# Patient Record
Sex: Female | Born: 1970 | Race: White | Hispanic: No | Marital: Married | Smoking: Current every day smoker
Health system: Southern US, Community
[De-identification: ages and names within clinical notes are randomized; demographics above are authoritative.]

## PROBLEM LIST (undated history)

## (undated) DIAGNOSIS — M5431 Sciatica, right side: Secondary | ICD-10-CM

## (undated) DIAGNOSIS — K219 Gastro-esophageal reflux disease without esophagitis: Secondary | ICD-10-CM

## (undated) DIAGNOSIS — D649 Anemia, unspecified: Secondary | ICD-10-CM

## (undated) DIAGNOSIS — R87619 Unspecified abnormal cytological findings in specimens from cervix uteri: Secondary | ICD-10-CM

## (undated) DIAGNOSIS — T7840XA Allergy, unspecified, initial encounter: Secondary | ICD-10-CM

## (undated) DIAGNOSIS — F32A Depression, unspecified: Secondary | ICD-10-CM

## (undated) DIAGNOSIS — G4730 Sleep apnea, unspecified: Secondary | ICD-10-CM

## (undated) DIAGNOSIS — K7581 Nonalcoholic steatohepatitis (NASH): Secondary | ICD-10-CM

## (undated) DIAGNOSIS — F4310 Post-traumatic stress disorder, unspecified: Secondary | ICD-10-CM

## (undated) DIAGNOSIS — R102 Pelvic and perineal pain: Secondary | ICD-10-CM

## (undated) DIAGNOSIS — K589 Irritable bowel syndrome without diarrhea: Secondary | ICD-10-CM

## (undated) DIAGNOSIS — F419 Anxiety disorder, unspecified: Secondary | ICD-10-CM

## (undated) DIAGNOSIS — M1990 Unspecified osteoarthritis, unspecified site: Secondary | ICD-10-CM

## (undated) DIAGNOSIS — J449 Chronic obstructive pulmonary disease, unspecified: Secondary | ICD-10-CM

## (undated) DIAGNOSIS — F329 Major depressive disorder, single episode, unspecified: Secondary | ICD-10-CM

## (undated) DIAGNOSIS — M549 Dorsalgia, unspecified: Secondary | ICD-10-CM

## (undated) DIAGNOSIS — G8929 Other chronic pain: Secondary | ICD-10-CM

## (undated) DIAGNOSIS — H269 Unspecified cataract: Secondary | ICD-10-CM

## (undated) DIAGNOSIS — C801 Malignant (primary) neoplasm, unspecified: Secondary | ICD-10-CM

## (undated) DIAGNOSIS — B977 Papillomavirus as the cause of diseases classified elsewhere: Secondary | ICD-10-CM

## (undated) DIAGNOSIS — M797 Fibromyalgia: Secondary | ICD-10-CM

## (undated) HISTORY — PX: FRACTURE SURGERY: SHX138

## (undated) HISTORY — DX: Sleep apnea, unspecified: G47.30

## (undated) HISTORY — DX: Papillomavirus as the cause of diseases classified elsewhere: B97.7

## (undated) HISTORY — PX: TUBAL LIGATION: SHX77

## (undated) HISTORY — DX: Gastro-esophageal reflux disease without esophagitis: K21.9

## (undated) HISTORY — PX: COLPOSCOPY VULVA W/ BIOPSY: SUR282

## (undated) HISTORY — DX: Malignant (primary) neoplasm, unspecified: C80.1

## (undated) HISTORY — DX: Allergy, unspecified, initial encounter: T78.40XA

## (undated) HISTORY — PX: OTHER SURGICAL HISTORY: SHX169

## (undated) HISTORY — DX: Unspecified abnormal cytological findings in specimens from cervix uteri: R87.619

## (undated) HISTORY — PX: CHOLECYSTECTOMY: SHX55

## (undated) HISTORY — DX: Unspecified cataract: H26.9

## (undated) HISTORY — PX: EXCISIONAL HEMORRHOIDECTOMY: SHX1541

---

## 2000-05-13 ENCOUNTER — Emergency Department (HOSPITAL_COMMUNITY): Admission: EM | Admit: 2000-05-13 | Discharge: 2000-05-13 | Payer: Self-pay | Admitting: Internal Medicine

## 2000-05-13 ENCOUNTER — Encounter: Payer: Self-pay | Admitting: Emergency Medicine

## 2000-10-14 ENCOUNTER — Encounter: Payer: Self-pay | Admitting: *Deleted

## 2000-10-14 ENCOUNTER — Emergency Department (HOSPITAL_COMMUNITY): Admission: EM | Admit: 2000-10-14 | Discharge: 2000-10-14 | Payer: Self-pay | Admitting: *Deleted

## 2009-03-13 ENCOUNTER — Emergency Department (HOSPITAL_COMMUNITY): Admission: EM | Admit: 2009-03-13 | Discharge: 2009-03-14 | Payer: Self-pay | Admitting: Emergency Medicine

## 2010-08-30 LAB — URINALYSIS, ROUTINE W REFLEX MICROSCOPIC
Bilirubin Urine: NEGATIVE
Glucose, UA: NEGATIVE mg/dL
Hgb urine dipstick: NEGATIVE
Ketones, ur: NEGATIVE mg/dL
Nitrite: NEGATIVE
Protein, ur: NEGATIVE mg/dL
Specific Gravity, Urine: 1.02 (ref 1.005–1.030)
Urobilinogen, UA: 0.2 mg/dL (ref 0.0–1.0)
pH: 6 (ref 5.0–8.0)

## 2010-08-30 LAB — PREGNANCY, URINE: Preg Test, Ur: NEGATIVE

## 2013-01-02 ENCOUNTER — Emergency Department (HOSPITAL_COMMUNITY)
Admission: EM | Admit: 2013-01-02 | Discharge: 2013-01-02 | Disposition: A | Discharge status: Home / Self Care | Payer: Self-pay | Attending: Emergency Medicine | Admitting: Emergency Medicine

## 2013-01-02 ENCOUNTER — Encounter (HOSPITAL_COMMUNITY): Payer: Self-pay

## 2013-01-02 DIAGNOSIS — R110 Nausea: Secondary | ICD-10-CM | POA: Insufficient documentation

## 2013-01-02 DIAGNOSIS — M5410 Radiculopathy, site unspecified: Secondary | ICD-10-CM

## 2013-01-02 DIAGNOSIS — IMO0002 Reserved for concepts with insufficient information to code with codable children: Secondary | ICD-10-CM | POA: Insufficient documentation

## 2013-01-02 DIAGNOSIS — M545 Low back pain, unspecified: Secondary | ICD-10-CM | POA: Insufficient documentation

## 2013-01-02 DIAGNOSIS — Z8739 Personal history of other diseases of the musculoskeletal system and connective tissue: Secondary | ICD-10-CM | POA: Insufficient documentation

## 2013-01-02 DIAGNOSIS — F17200 Nicotine dependence, unspecified, uncomplicated: Secondary | ICD-10-CM | POA: Insufficient documentation

## 2013-01-02 HISTORY — DX: Sciatica, right side: M54.31

## 2013-01-02 MED ORDER — CYCLOBENZAPRINE HCL 10 MG PO TABS
10.0000 mg | ORAL_TABLET | Freq: Once | ORAL | Status: AC
  Administered 2013-01-02: 10 mg via ORAL
  Filled 2013-01-02: qty 1

## 2013-01-02 MED ORDER — PREDNISONE 20 MG PO TABS
40.0000 mg | ORAL_TABLET | Freq: Once | ORAL | Status: AC
  Administered 2013-01-02: 40 mg via ORAL
  Filled 2013-01-02: qty 2

## 2013-01-02 MED ORDER — PREDNISONE 10 MG PO TABS: 20.0000 mg | ORAL_TABLET | Freq: Two times a day (BID) | ORAL | Status: DC

## 2013-01-02 MED ORDER — HYDROCODONE-ACETAMINOPHEN 5-325 MG PO TABS
1.0000 | ORAL_TABLET | Freq: Once | ORAL | Status: AC
  Administered 2013-01-02: 1 via ORAL
  Filled 2013-01-02: qty 1

## 2013-01-02 MED ORDER — HYDROCODONE-ACETAMINOPHEN 5-325 MG PO TABS: 1.0000 | ORAL_TABLET | ORAL | Status: DC | PRN

## 2013-01-02 MED ORDER — CYCLOBENZAPRINE HCL 10 MG PO TABS: 10.0000 mg | ORAL_TABLET | Freq: Two times a day (BID) | ORAL | Status: DC | PRN

## 2013-01-02 NOTE — ED Notes (Signed)
Pt alert & oriented x4, stable gait. Patient  given discharge instructions, paperwork & prescription(s). Patient verbalized understanding. Pt left department w/ no further questions. 

## 2013-01-02 NOTE — ED Notes (Signed)
Pt reports pain from the right buttocks and down the right leg. Pt has been taken ibuprofen for pain & not helping.

## 2013-01-02 NOTE — ED Provider Notes (Signed)
CSN: [id number]     Arrival date & time 01/02/13  2206 History     First MD Initiated Contact with Patient 01/02/13 2217     Chief Complaint  Patient presents with  . Back Pain   (Consider location/radiation/quality/duration/timing/severity/associated sxs/prior Treatment) Patient is a 42 y.o. female presenting with back pain. The history is provided by the patient.  Back Pain Location:  Lumbar spine Quality:  Shooting Radiates to:  R posterior upper leg Pain severity:  Severe Pain is:  Same all the time Progression:  Worsening Associated symptoms: no abdominal pain, no bladder incontinence, no bowel incontinence, no dysuria, no fever and no headaches    Jennifer Rollins is a 42 y.o. female who presents to the ED with low back pain. The pain started 3 months ago. She went to her PCP and was dx with Sciatica. She received a steroid shot and muscle relaxants. She did not get any better so she went to a chiropractor and he adjusted her back and went twice a week for 3 weeks. That did not help. Went to a massage therapist and that did not help. Today the pain has become severe. The pain is located on the right side of the lowe back and radiates to the right buttock and goes down the leg to the right foot. She rates the pain as 8/10.   Past Medical History  Diagnosis Date  . Sciatica   . Sciatica of right side    Past Surgical History  Procedure Laterality Date  . Cesarean section    . Cholecystectomy     No family history on file. History  Substance Use Topics  . Smoking status: Current Some Day Smoker  . Smokeless tobacco: Not on file  . Alcohol Use: No   OB History   Grav Para Term Preterm Abortions TAB SAB Ect Mult Living                 Review of Systems  Constitutional: Negative for fever and chills.  HENT: Negative for sore throat.   Gastrointestinal: Positive for nausea. Negative for vomiting, abdominal pain and bowel incontinence.  Genitourinary: Negative for  bladder incontinence, dysuria and frequency.  Musculoskeletal: Positive for back pain.  Skin: Negative for rash.  Neurological: Negative for light-headedness and headaches.  Psychiatric/Behavioral: The patient is not nervous/anxious.     Allergies  Review of patient's allergies indicates no known allergies.  Home Medications  No current outpatient prescriptions on file. BP 121/89  Pulse 86  Temp(Src) 98.2 F (36.8 C) (Oral)  Resp 18  Ht 5\' 5"  (1.651 m)  Wt 185 lb (83.915 kg)  BMI 30.79 kg/m2  SpO2 99%  LMP 12/29/2012 Physical Exam  Nursing note and vitals reviewed. Constitutional: She is oriented to person, place, and time. She appears well-developed and well-nourished.  HENT:  Head: Normocephalic.  Eyes: Conjunctivae and EOM are normal.  Neck: Neck supple.  Cardiovascular: Normal rate, regular rhythm and normal heart sounds.   Pulmonary/Chest: Effort normal and breath sounds normal.  Abdominal: Soft. Bowel sounds are normal. There is no tenderness.  Musculoskeletal:       Lumbar back: She exhibits decreased range of motion, tenderness and spasm. She exhibits normal pulse.       Back:  Radial and pedal pulses strong, adequate circulation, good touch sensation.  Neurological: She is alert and oriented to person, place, and time. She has normal strength and normal reflexes. No cranial nerve deficit or sensory deficit. Gait normal.  Skin: Skin is warm and dry.  Psychiatric: She has a normal mood and affect. Her behavior is normal.    ED Course  Flexeril 10 mg., Hydrocodone 5/325 mg., prednisone 40 mg. PO here in ED Procedures  MDM  42 y.o. female with three month history of low back pain, muscle spasm. Patient with some relief after medications. Will continue treatment and follow up with ortho if symptoms persist.  Discussed with the patient and all questioned fully answered. She will return if any problems arise.    Medication List         cyclobenzaprine 10 MG  tablet  Commonly known as:  FLEXERIL  Take 1 tablet (10 mg total) by mouth 2 (two) times daily as needed for muscle spasms.     HYDROcodone-acetaminophen 5-325 MG per tablet  Commonly known as:  NORCO/VICODIN  Take 1 tablet by mouth every 4 (four) hours as needed.     predniSONE 10 MG tablet  Commonly known as:  DELTASONE  Take 2 tablets (20 mg total) by mouth 2 (two) times daily.         [address] [place], [place] 01/03/13 [phone]

## 2013-01-02 NOTE — ED Notes (Signed)
Back pain, my family MD said it was sciatica. Hurting on the right side, down into my right leg and my right foot is numb. Pain is making me nauseated.

## 2013-01-03 NOTE — ED Provider Notes (Signed)
History/physical exam/procedure(s) were performed by non-physician practitioner and as supervising physician I was immediately available for consultation/collaboration. I have reviewed all notes and am in agreement with care and plan.   Hilario Quarry, MD 01/03/13 [phone]

## 2013-02-08 ENCOUNTER — Encounter (HOSPITAL_COMMUNITY): Payer: Self-pay

## 2013-02-08 ENCOUNTER — Emergency Department (HOSPITAL_COMMUNITY)
Admission: EM | Admit: 2013-02-08 | Discharge: 2013-02-08 | Disposition: A | Discharge status: Home / Self Care | Payer: Self-pay | Attending: Emergency Medicine | Admitting: Emergency Medicine

## 2013-02-08 DIAGNOSIS — R51 Headache: Secondary | ICD-10-CM | POA: Insufficient documentation

## 2013-02-08 DIAGNOSIS — Z8739 Personal history of other diseases of the musculoskeletal system and connective tissue: Secondary | ICD-10-CM | POA: Insufficient documentation

## 2013-02-08 DIAGNOSIS — Z91040 Latex allergy status: Secondary | ICD-10-CM | POA: Insufficient documentation

## 2013-02-08 DIAGNOSIS — H9209 Otalgia, unspecified ear: Secondary | ICD-10-CM | POA: Insufficient documentation

## 2013-02-08 DIAGNOSIS — F17200 Nicotine dependence, unspecified, uncomplicated: Secondary | ICD-10-CM | POA: Insufficient documentation

## 2013-02-08 DIAGNOSIS — R110 Nausea: Secondary | ICD-10-CM | POA: Insufficient documentation

## 2013-02-08 DIAGNOSIS — Z79899 Other long term (current) drug therapy: Secondary | ICD-10-CM | POA: Insufficient documentation

## 2013-02-08 DIAGNOSIS — J069 Acute upper respiratory infection, unspecified: Secondary | ICD-10-CM | POA: Insufficient documentation

## 2013-02-08 LAB — RAPID STREP SCREEN (MED CTR MEBANE ONLY): Streptococcus, Group A Screen (Direct): NEGATIVE

## 2013-02-08 MED ORDER — NAPROXEN 250 MG PO TABS
500.0000 mg | ORAL_TABLET | Freq: Once | ORAL | Status: AC
  Administered 2013-02-08: 500 mg via ORAL
  Filled 2013-02-08: qty 2

## 2013-02-08 MED ORDER — NAPROXEN 500 MG PO TABS: 500.0000 mg | ORAL_TABLET | Freq: Two times a day (BID) | ORAL | Status: DC

## 2013-02-08 MED ORDER — OXYMETAZOLINE HCL 0.05 % NA SOLN: 2.0000 | Freq: Two times a day (BID) | NASAL | Status: DC

## 2013-02-08 NOTE — ED Notes (Signed)
Pt alert & oriented x4, stable gait. Patient given discharge instructions, paperwork & prescription(s). Patient  instructed to stop at the registration desk to finish any additional paperwork. Patient verbalized understanding. Pt left department w/ no further questions. 

## 2013-02-08 NOTE — ED Provider Notes (Signed)
CSN: [id number]     Arrival date & time 02/08/13  0865 History  This chart was scribed for Vida Roller, MD by Quintella Reichert, ED scribe.  This patient was seen in room [facility] and the patient's care was started at 9:48 AM.   Chief Complaint  Patient presents with  . Otalgia  . Sore Throat  . Nausea    The history is provided by the patient. No language interpreter was used.    HPI Comments: Claude Swendsen is a 42 y.o. female who presents to the Emergency Department complaining of 3 days of constant, moderate, gradual-onset, gradually-worsening sore throat with associated left ear pain, nausea, pressure around her right eye and mild swelling around her neck.  She denies fever, rhinorrhea, CP, cough, abdominal pain, dysuria, diarrhea, or any other associated symptoms.  Pt also notes that she had a headache 3 days ago "so bad it made me throw up," but none since then.  She notes that her friend's baby had a cold but denies any other recent sick contact.   Past Medical History  Diagnosis Date  . Sciatica   . Sciatica of right side     Past Surgical History  Procedure Laterality Date  . Cesarean section    . Cholecystectomy      No family history on file.   History  Substance Use Topics  . Smoking status: Current Some Day Smoker  . Smokeless tobacco: Not on file  . Alcohol Use: No    OB History   Grav Para Term Preterm Abortions TAB SAB Ect Mult Living                  Review of Systems  All other systems reviewed and are negative.     Allergies  Latex  Home Medications   Current Outpatient Rx  Name  Route  Sig  Dispense  Refill  . Cholecalciferol (VITAMIN D) 2000 UNITS tablet   Oral   Take 2,000 Units by mouth daily.         Marland Kitchen levothyroxine (SYNTHROID, LEVOTHROID) 75 MCG tablet   Oral   Take 75 mcg by mouth daily before breakfast.         . lovastatin (MEVACOR) 20 MG tablet   Oral   Take 40 mg by mouth at bedtime.         . sertraline  (ZOLOFT) 100 MG tablet   Oral   Take 200 mg by mouth daily.         . naproxen (NAPROSYN) 500 MG tablet   Oral   Take 1 tablet (500 mg total) by mouth 2 (two) times daily with a meal.   30 tablet   0   . oxymetazoline (AFRIN NASAL SPRAY) 0.05 % nasal spray   Nasal   Place 2 sprays into the nose 2 (two) times daily.   30 mL   0    BP 134/74  Pulse 63  Temp(Src) 98.5 F (36.9 C) (Oral)  Resp 16  SpO2 98%  LMP 02/01/2013  Physical Exam  Nursing note and vitals reviewed. Constitutional: She appears well-developed and well-nourished. No distress.  HENT:  Head: Normocephalic and atraumatic.  Mouth/Throat: Uvula is midline and mucous membranes are normal. Posterior oropharyngeal erythema present. No oropharyngeal exudate or tonsillar abscesses.  Mild swelling of nasal turbinates  Eyes: Conjunctivae and EOM are normal. Pupils are equal, round, and reactive to light. Right eye exhibits no discharge. Left eye exhibits no discharge. No scleral  icterus.  Neck: Normal range of motion. Neck supple. No JVD present. No thyromegaly present.  Neck very supple  Cardiovascular: Normal rate, regular rhythm, normal heart sounds and intact distal pulses.  Exam reveals no gallop and no friction rub.   No murmur heard. Pulmonary/Chest: Effort normal and breath sounds normal. No respiratory distress. She has no wheezes. She has no rales.  Abdominal: Soft. Bowel sounds are normal. She exhibits no distension and no mass. There is no tenderness.  Musculoskeletal: Normal range of motion. She exhibits no edema and no tenderness.  Lymphadenopathy:    She has no cervical adenopathy.  Neurological: She is alert. Coordination normal.  Skin: Skin is warm and dry. No rash noted. No erythema.  Psychiatric: She has a normal mood and affect. Her behavior is normal.    ED Course  Procedures (including critical care time)  DIAGNOSTIC STUDIES: Oxygen Saturation is 98% on room air, normal by my  interpretation.    COORDINATION OF CARE: 9:54 AM-Discussed treatment plan which includes pain medication and strep screen with pt at bedside and pt agreed to plan.   Labs Review Labs Reviewed  RAPID STREP SCREEN  CULTURE, GROUP A STREP    Imaging Review No results found.  MDM   1. URI (upper respiratory infection)    Pt appears well, has URI sx, strep neg, stable for d/c.  Meds given in ED:  Medications  naproxen (NAPROSYN) tablet 500 mg (500 mg Oral Given 02/08/13 0957)    New Prescriptions   NAPROXEN (NAPROSYN) 500 MG TABLET    Take 1 tablet (500 mg total) by mouth 2 (two) times daily with a meal.   OXYMETAZOLINE (AFRIN NASAL SPRAY) 0.05 % NASAL SPRAY    Place 2 sprays into the nose 2 (two) times daily.     I personally performed the services described in this documentation, which was scribed in my presence. The recorded information has been reviewed and is accurate.      Vida Roller, MD 02/08/13 1140

## 2013-02-08 NOTE — ED Notes (Signed)
Pt reports sore throat, left ear pain, and nausea and some emesis since Friday.  Pt denies any fever.

## 2013-02-10 LAB — CULTURE, GROUP A STREP

## 2013-02-22 ENCOUNTER — Encounter (HOSPITAL_COMMUNITY): Payer: Self-pay | Admitting: *Deleted

## 2013-02-22 ENCOUNTER — Emergency Department (HOSPITAL_COMMUNITY)
Admission: EM | Admit: 2013-02-22 | Discharge: 2013-02-22 | Disposition: A | Discharge status: Home / Self Care | Payer: Self-pay | Attending: Emergency Medicine | Admitting: Emergency Medicine

## 2013-02-22 DIAGNOSIS — Z79899 Other long term (current) drug therapy: Secondary | ICD-10-CM | POA: Insufficient documentation

## 2013-02-22 DIAGNOSIS — F32A Depression, unspecified: Secondary | ICD-10-CM

## 2013-02-22 DIAGNOSIS — Z3202 Encounter for pregnancy test, result negative: Secondary | ICD-10-CM | POA: Insufficient documentation

## 2013-02-22 DIAGNOSIS — F329 Major depressive disorder, single episode, unspecified: Secondary | ICD-10-CM

## 2013-02-22 DIAGNOSIS — Z91040 Latex allergy status: Secondary | ICD-10-CM | POA: Insufficient documentation

## 2013-02-22 DIAGNOSIS — Z8739 Personal history of other diseases of the musculoskeletal system and connective tissue: Secondary | ICD-10-CM | POA: Insufficient documentation

## 2013-02-22 DIAGNOSIS — F3289 Other specified depressive episodes: Secondary | ICD-10-CM | POA: Insufficient documentation

## 2013-02-22 DIAGNOSIS — F4310 Post-traumatic stress disorder, unspecified: Secondary | ICD-10-CM | POA: Insufficient documentation

## 2013-02-22 DIAGNOSIS — F17200 Nicotine dependence, unspecified, uncomplicated: Secondary | ICD-10-CM | POA: Insufficient documentation

## 2013-02-22 HISTORY — DX: Post-traumatic stress disorder, unspecified: F43.10

## 2013-02-22 HISTORY — DX: Major depressive disorder, single episode, unspecified: F32.9

## 2013-02-22 HISTORY — DX: Depression, unspecified: F32.A

## 2013-02-22 LAB — URINALYSIS, ROUTINE W REFLEX MICROSCOPIC
Bilirubin Urine: NEGATIVE
Nitrite: NEGATIVE
Protein, ur: NEGATIVE mg/dL
Specific Gravity, Urine: 1.025 (ref 1.005–1.030)
Urobilinogen, UA: 0.2 mg/dL (ref 0.0–1.0)

## 2013-02-22 LAB — BASIC METABOLIC PANEL
BUN: 9 mg/dL (ref 6–23)
Calcium: 9.6 mg/dL (ref 8.4–10.5)
Creatinine, Ser: 1.01 mg/dL (ref 0.50–1.10)
GFR calc Af Amer: 77 mL/min — ABNORMAL LOW (ref >90)
GFR calc non Af Amer: 67 mL/min — ABNORMAL LOW (ref >90)
Glucose, Bld: 111 mg/dL — ABNORMAL HIGH (ref 70–99)

## 2013-02-22 LAB — CBC WITH DIFFERENTIAL/PLATELET
Basophils Absolute: 0 10*3/uL (ref 0.0–0.1)
Basophils Relative: 0 % (ref 0–1)
Eosinophils Absolute: 0.1 10*3/uL (ref 0.0–0.7)
MCH: 28.2 pg (ref 26.0–34.0)
MCHC: 31.6 g/dL (ref 30.0–36.0)
Neutro Abs: 6.4 10*3/uL (ref 1.7–7.7)
Neutrophils Relative %: 74 % (ref 43–77)
RDW: 15.2 % (ref 11.5–15.5)

## 2013-02-22 LAB — RAPID URINE DRUG SCREEN, HOSP PERFORMED
Amphetamines: NOT DETECTED
Opiates: NOT DETECTED
Tetrahydrocannabinol: NOT DETECTED

## 2013-02-22 LAB — POCT PREGNANCY, URINE: Preg Test, Ur: NEGATIVE

## 2013-02-22 MED ORDER — SERTRALINE HCL 100 MG PO TABS
200.0000 mg | ORAL_TABLET | Freq: Every day | ORAL | Status: DC
  Filled 2013-02-22 (×2): qty 2

## 2013-02-22 MED ORDER — NICOTINE 14 MG/24HR TD PT24
14.0000 mg | MEDICATED_PATCH | Freq: Once | TRANSDERMAL | Status: DC
  Administered 2013-02-22: 14 mg via TRANSDERMAL
  Filled 2013-02-22: qty 1

## 2013-02-22 MED ORDER — IBUPROFEN 800 MG PO TABS: 800.0000 mg | ORAL_TABLET | Freq: Four times a day (QID) | ORAL | Status: DC | PRN

## 2013-02-22 MED ORDER — ARIPIPRAZOLE 2 MG PO TABS
2.0000 mg | ORAL_TABLET | Freq: Every day | ORAL | Status: DC
  Filled 2013-02-22 (×2): qty 1

## 2013-02-22 MED ORDER — HYDROXYZINE HCL 50 MG PO TABS: 50.0000 mg | ORAL_TABLET | Freq: Four times a day (QID) | ORAL | Status: DC | PRN

## 2013-02-22 MED ORDER — LORAZEPAM 1 MG PO TABS
1.0000 mg | ORAL_TABLET | Freq: Once | ORAL | Status: AC
  Administered 2013-02-22: 1 mg via ORAL
  Filled 2013-02-22: qty 1

## 2013-02-22 MED ORDER — ONDANSETRON 8 MG PO TBDP: 8.0000 mg | ORAL_TABLET | Freq: Three times a day (TID) | ORAL | Status: DC | PRN

## 2013-02-22 MED ORDER — LEVOTHYROXINE SODIUM 75 MCG PO TABS
75.0000 ug | ORAL_TABLET | Freq: Every day | ORAL | Status: DC
Start: 2013-02-23 — End: 2013-02-23
  Filled 2013-02-22 (×2): qty 1

## 2013-02-22 NOTE — ED Notes (Signed)
Appt for TTS consult is 1845.

## 2013-02-22 NOTE — ED Provider Notes (Signed)
CSN: [id number]     Arrival date & time 02/22/13  1528 History   First MD Initiated Contact with Patient 02/22/13 1614     Chief Complaint - suicidal  Patient is a 42 y.o. female presenting with mental health disorder. The history is provided by the patient.  Mental Health Problem Presenting symptoms: suicidal thoughts   Degree of incapacity (severity):  Moderate Onset quality:  Gradual Duration:  1 day Timing:  Constant Progression:  Worsening Chronicity:  New Relieved by:  Nothing Associated symptoms: no abdominal pain    Patient presents for thoughts of being suicidal She reports that she has thoughts of overdosing on meds She has not attempted suicide as of yet  She reports her course is worsening Nothing improves her symptoms  Past Medical History  Diagnosis Date  . Sciatica   . Sciatica of right side   . PTSD (post-traumatic stress disorder)   . Depression    Past Surgical History  Procedure Laterality Date  . Cesarean section    . Cholecystectomy    . Tubal ligation     History reviewed. No pertinent family history. History  Substance Use Topics  . Smoking status: Current Some Day Smoker  . Smokeless tobacco: Not on file  . Alcohol Use: No   OB History   Grav Para Term Preterm Abortions TAB SAB Ect Mult Living                 Review of Systems  Constitutional: Negative for fever.  Gastrointestinal: Negative for abdominal pain.  Neurological: Negative for weakness.  Psychiatric/Behavioral: Positive for suicidal ideas.  All other systems reviewed and are negative.    Allergies  Latex  Home Medications   Current Outpatient Rx  Name  Route  Sig  Dispense  Refill  . ARIPiprazole (ABILIFY) 2 MG tablet   Oral   Take 2 mg by mouth daily.         . Cholecalciferol (VITAMIN D) 2000 UNITS tablet   Oral   Take 2,000 Units by mouth daily.         Marland Kitchen levothyroxine (SYNTHROID, LEVOTHROID) 75 MCG tablet   Oral   Take 75 mcg by mouth daily before  breakfast.         . lovastatin (MEVACOR) 20 MG tablet   Oral   Take 40 mg by mouth at bedtime.         . sertraline (ZOLOFT) 100 MG tablet   Oral   Take 200 mg by mouth daily.          BP 130/81  Pulse 86  Temp(Src) 98.8 F (37.1 C) (Oral)  Resp 17  Ht 5\' 5"  (1.651 m)  Wt 180 lb (81.647 kg)  BMI 29.95 kg/m2  SpO2 100%  LMP 02/01/2013 Physical Exam CONSTITUTIONAL: Well developed/well nourished HEAD: Normocephalic/atraumatic EYES: EOMI/PERRL ENMT: Mucous membranes moist NECK: supple no meningeal signs SPINE:entire spine nontender CV: S1/S2 noted, no murmurs/rubs/gallops noted LUNGS: Lungs are clear to auscultation bilaterally, no apparent distress ABDOMEN: soft, nontender, no rebound or guarding GU:no cva tenderness NEURO: Pt is awake/alert, moves all extremitiesx4 EXTREMITIES: pulses normal, full ROM SKIN: warm, color normal PSYCH: anxious  ED Course  Procedures  Labs Review Labs Reviewed  BASIC METABOLIC PANEL  CBC WITH DIFFERENTIAL  URINALYSIS, ROUTINE W REFLEX MICROSCOPIC  ETHANOL  URINE RAPID DRUG SCREEN (HOSP PERFORMED)  POCT PREGNANCY, URINE    MDM  No diagnosis found. Nursing notes including past medical history and social history reviewed  and considered in documentation Labs/vital reviewed and considered   6:45 PM Pt is medically stable Awaiting psych evaluation She is suicidal and will need placement   Joya Gaskins, MD 02/22/13 1846
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
## 2013-02-22 NOTE — ED Provider Notes (Signed)
On reassessment pt now denies SI She has been seen by psych. The case has been discussed with dr Lucianne Muss Pt can be safely discharged home with vistaril and outpatient followup Pt is agreeable with this plan and she appears appropriate for d/c home   Joya Gaskins, MD 02/22/13 2115

## 2013-02-22 NOTE — ED Notes (Signed)
Pt says she is suicidal

## 2013-09-07 ENCOUNTER — Emergency Department (HOSPITAL_COMMUNITY)
Admission: EM | Admit: 2013-09-07 | Discharge: 2013-09-07 | Disposition: A | Discharge status: Home / Self Care | Payer: Self-pay | Attending: Emergency Medicine | Admitting: Emergency Medicine

## 2013-09-07 ENCOUNTER — Encounter (HOSPITAL_COMMUNITY): Payer: Self-pay | Admitting: Emergency Medicine

## 2013-09-07 DIAGNOSIS — T148XXA Other injury of unspecified body region, initial encounter: Secondary | ICD-10-CM

## 2013-09-07 DIAGNOSIS — F3289 Other specified depressive episodes: Secondary | ICD-10-CM | POA: Insufficient documentation

## 2013-09-07 DIAGNOSIS — F329 Major depressive disorder, single episode, unspecified: Secondary | ICD-10-CM | POA: Insufficient documentation

## 2013-09-07 DIAGNOSIS — S36899A Unspecified injury of other intra-abdominal organs, initial encounter: Secondary | ICD-10-CM | POA: Insufficient documentation

## 2013-09-07 DIAGNOSIS — W268XXA Contact with other sharp object(s), not elsewhere classified, initial encounter: Secondary | ICD-10-CM | POA: Insufficient documentation

## 2013-09-07 DIAGNOSIS — F4310 Post-traumatic stress disorder, unspecified: Secondary | ICD-10-CM | POA: Insufficient documentation

## 2013-09-07 DIAGNOSIS — IMO0002 Reserved for concepts with insufficient information to code with codable children: Secondary | ICD-10-CM | POA: Insufficient documentation

## 2013-09-07 DIAGNOSIS — Z79899 Other long term (current) drug therapy: Secondary | ICD-10-CM | POA: Insufficient documentation

## 2013-09-07 DIAGNOSIS — L089 Local infection of the skin and subcutaneous tissue, unspecified: Secondary | ICD-10-CM

## 2013-09-07 DIAGNOSIS — Z91040 Latex allergy status: Secondary | ICD-10-CM | POA: Insufficient documentation

## 2013-09-07 DIAGNOSIS — Y9389 Activity, other specified: Secondary | ICD-10-CM | POA: Insufficient documentation

## 2013-09-07 DIAGNOSIS — Z9851 Tubal ligation status: Secondary | ICD-10-CM | POA: Insufficient documentation

## 2013-09-07 DIAGNOSIS — Y929 Unspecified place or not applicable: Secondary | ICD-10-CM | POA: Insufficient documentation

## 2013-09-07 DIAGNOSIS — Z8739 Personal history of other diseases of the musculoskeletal system and connective tissue: Secondary | ICD-10-CM | POA: Insufficient documentation

## 2013-09-07 DIAGNOSIS — F17200 Nicotine dependence, unspecified, uncomplicated: Secondary | ICD-10-CM | POA: Insufficient documentation

## 2013-09-07 NOTE — Discharge Instructions (Signed)
Wound Infection A wound infection happens when a type of germ (bacteria) grows in a wound. Caring for the infection can help the wound heal. Wound infections need treatment. HOME CARE     Clean the wound with mild soap and water as told. Rinse the soap off. Pat the area dry with a clean towel. Do not rub the wound.  Change any bandages (dressings) as told by your doctor.  Put cream and a bandage on the wound as told by your doctor.  If the bandage sticks, wet it with soapy water to remove the bandage.  Change the bandage if it gets wet, dirty, or starts to smell.  Take showers or a warm bath to clean area. Do not swim.  Avoid exercise that makes you sweat.  May apply OTC antibiotic ointment.  If your wound itches, use a medicine that helps stop itching. Do not pick or scratch at the wound.  Keep all doctor visits as told. GET HELP RIGHT AWAY IF:   You have more puffiness (swelling), pain, or redness around the wound.  You have more yellowish-white fluid (pus) coming from the wound.  You have a bad smell coming from the wound.  Your wound breaks open more.  You have a fever.  You have abdominal pain, vomiting, rash, dizziness, weakness, or other concerns. MAKE SURE YOU:   Understand these instructions.  Will watch your condition.  Will get help right away if you are not doing well or get worse. Document Released: 02/20/2008 Document Revised: 08/05/2011 Document Reviewed: 10/22/2010 [facility] Patient Information 2014 [place].

## 2013-09-07 NOTE — ED Notes (Signed)
Pt alert & oriented x4, stable gait. Patient given discharge instructions, paperwork & prescription(s). Patient  instructed to stop at the registration desk to finish any additional paperwork. Patient verbalized understanding. Pt left department w/ no further questions. 

## 2013-09-07 NOTE — ED Provider Notes (Signed)
CSN: [id number]     Arrival date & time 09/07/13  1814 History   First MD Initiated Contact with Patient 09/07/13 1856    This chart was scribed for Jennifer Relic, MD by Terressa Koyanagi, ED Scribe. This patient was seen in room APA08/APA08 and the patient's care was started at 6:58 PM.  Chief Complaint  Patient presents with  . Abscess  . Groin Pain   HPI HPI Comments: Jennifer Rollins is a 43 y.o. female who presents to the Emergency Department complaining of an abscess in her perianal and inguinal area onset couple of days ago. Pt denies any radiating pain or associated symptoms. Pt denies chest pain, cough, abd pain, nausea, vomiting.   Past Medical History  Diagnosis Date  . Sciatica   . Sciatica of right side   . PTSD (post-traumatic stress disorder)   . Depression    Past Surgical History  Procedure Laterality Date  . Cesarean section    . Cholecystectomy    . Tubal ligation     Family History  Problem Relation Age of Onset  . Diabetes Mother   . Heart attack Mother    History  Substance Use Topics  . Smoking status: Current Some Day Smoker -- 1.00 packs/day for 30 years    Types: Cigarettes  . Smokeless tobacco: Never Used  . Alcohol Use: No   OB History   Grav Para Term Preterm Abortions TAB SAB Ect Mult Living   4 2 2  2     2      Review of Systems  Skin:       Abrasion in inhuinal and perianal region     10 Systems reviewed and all are negative for acute change except as noted in the HPI.    Allergies  Latex  Home Medications   Prior to Admission medications   Medication Sig Start Date End Date Taking? Authorizing Provider  ARIPiprazole (ABILIFY) 2 MG tablet Take 2 mg by mouth daily.    Historical Provider, MD  Cholecalciferol (VITAMIN D) 2000 UNITS tablet Take 2,000 Units by mouth daily.    Historical Provider, MD  hydrOXYzine (ATARAX/VISTARIL) 50 MG tablet Take 1 tablet (50 mg total) by mouth every 6 (six) hours as needed for anxiety. 02/22/13    Sharyon Cable, MD  levothyroxine (SYNTHROID, LEVOTHROID) 75 MCG tablet Take 75 mcg by mouth daily before breakfast.    Historical Provider, MD  lovastatin (MEVACOR) 20 MG tablet Take 40 mg by mouth at bedtime.    Historical Provider, MD  sertraline (ZOLOFT) 100 MG tablet Take 200 mg by mouth daily.    Historical Provider, MD   Triage Vital: BP 115/67  Pulse 81  Temp(Src) 98.4 F (36.9 C) (Oral)  Resp 18  Ht 5\' 5"  (1.651 m)  Wt 185 lb (83.915 kg)  BMI 30.79 kg/m2  SpO2 96%  LMP 09/07/2013 Physical Exam  Nursing note and vitals reviewed. Constitutional:  Awake, alert, nontoxic appearance.  HENT:  Head: Atraumatic.  Eyes: Right eye exhibits no discharge. Left eye exhibits no discharge.  Neck: Neck supple.  Cardiovascular: Normal rate and regular rhythm.   No murmur heard. Pulmonary/Chest: Effort normal. She exhibits no tenderness.  Abdominal: Soft. Bowel sounds are normal. There is no tenderness. There is no rebound.  Musculoskeletal: She exhibits no tenderness.  Baseline ROM, no obvious new focal weakness.  Neurological:  Mental status and motor strength appears baseline for patient and situation.  Skin: No rash noted.  Right inguinal crease has less than 1 cm tender suspected lymph node without erythema, fluctuant, induration or palpable hernia.   Perianal area 5 o'clock position 5 mm laceration with minimal surrounding erythema less than 15mm, and scant purulent drainage. No palpable abscess.  Psychiatric: She has a normal mood and affect.    ED Course  Procedures (including critical care time) DIAGNOSTIC STUDIES: Oxygen Saturation is 96% on RA, adequate by my interpretation.    COORDINATION OF CARE:  7:04 PM-Discussed treatment plan which includes soaking in hot water 2-3x a day with pt at bedside and pt agreed to plan. Pt is advised to return to ED if symptoms worsen.   Labs Review Labs Reviewed - No data to display  Imaging Review No results found.   EKG  Interpretation None      MDM   Final diagnoses:  Wound infection    I doubt any other EMC precluding discharge at this time including, but not necessarily limited to the following: Sepsis, necrotizing fasciitis, or abscess requiring incision and drainage at this time.  I personally performed the services described in this documentation, which was scribed in my presence. The recorded information has been reviewed and is accurate.    Jennifer Relic, MD 09/12/13 [phone]

## 2013-09-07 NOTE — ED Notes (Addendum)
Patient c/o pain with ?abscess at rectum. Per patient was shaving "a couple weeks ago in that region" when she accidentally cut a small place at rectum. Patient now reports a "knot" that hurts with BM or passing gas. Patient also reports right groin pain. C/o nausea. Denies vomiting, fevers, or diarrhea. Per patient pain with standing and sitting.

## 2013-11-10 ENCOUNTER — Other Ambulatory Visit (HOSPITAL_COMMUNITY): Payer: Self-pay | Admitting: *Deleted

## 2013-11-10 DIAGNOSIS — Z1231 Encounter for screening mammogram for malignant neoplasm of breast: Secondary | ICD-10-CM

## 2013-11-15 ENCOUNTER — Ambulatory Visit (HOSPITAL_COMMUNITY): Payer: Self-pay

## 2013-12-15 ENCOUNTER — Ambulatory Visit (HOSPITAL_COMMUNITY)
Admission: RE | Admit: 2013-12-15 | Discharge: 2013-12-15 | Disposition: A | Discharge status: Home / Self Care | Payer: Self-pay | Source: Ambulatory Visit | Attending: *Deleted | Admitting: *Deleted

## 2013-12-15 ENCOUNTER — Ambulatory Visit (HOSPITAL_COMMUNITY): Payer: Self-pay

## 2013-12-15 DIAGNOSIS — Z1231 Encounter for screening mammogram for malignant neoplasm of breast: Secondary | ICD-10-CM

## 2014-02-01 ENCOUNTER — Other Ambulatory Visit (HOSPITAL_COMMUNITY)
Admission: RE | Admit: 2014-02-01 | Discharge: 2014-02-01 | Disposition: A | Discharge status: Home / Self Care | Payer: Self-pay | Source: Ambulatory Visit | Attending: Unknown Physician Specialty | Admitting: Unknown Physician Specialty

## 2014-02-01 DIAGNOSIS — R87612 Low grade squamous intraepithelial lesion on cytologic smear of cervix (LGSIL): Secondary | ICD-10-CM | POA: Insufficient documentation

## 2014-02-01 DIAGNOSIS — Z124 Encounter for screening for malignant neoplasm of cervix: Secondary | ICD-10-CM | POA: Insufficient documentation

## 2014-02-03 LAB — CYTOLOGY - PAP

## 2014-03-01 ENCOUNTER — Encounter (HOSPITAL_COMMUNITY): Payer: Self-pay | Admitting: Emergency Medicine

## 2014-03-01 ENCOUNTER — Emergency Department (HOSPITAL_COMMUNITY)
Admission: EM | Admit: 2014-03-01 | Discharge: 2014-03-01 | Disposition: A | Discharge status: Home / Self Care | Payer: Self-pay | Attending: Emergency Medicine | Admitting: Emergency Medicine

## 2014-03-01 DIAGNOSIS — Z91040 Latex allergy status: Secondary | ICD-10-CM | POA: Insufficient documentation

## 2014-03-01 DIAGNOSIS — R059 Cough, unspecified: Secondary | ICD-10-CM

## 2014-03-01 DIAGNOSIS — Z79899 Other long term (current) drug therapy: Secondary | ICD-10-CM | POA: Insufficient documentation

## 2014-03-01 DIAGNOSIS — Z8739 Personal history of other diseases of the musculoskeletal system and connective tissue: Secondary | ICD-10-CM | POA: Insufficient documentation

## 2014-03-01 DIAGNOSIS — R05 Cough: Secondary | ICD-10-CM | POA: Insufficient documentation

## 2014-03-01 DIAGNOSIS — Z720 Tobacco use: Secondary | ICD-10-CM | POA: Insufficient documentation

## 2014-03-01 DIAGNOSIS — R0789 Other chest pain: Secondary | ICD-10-CM | POA: Insufficient documentation

## 2014-03-01 DIAGNOSIS — M791 Myalgia: Secondary | ICD-10-CM | POA: Insufficient documentation

## 2014-03-01 DIAGNOSIS — J0100 Acute maxillary sinusitis, unspecified: Secondary | ICD-10-CM | POA: Insufficient documentation

## 2014-03-01 DIAGNOSIS — F329 Major depressive disorder, single episode, unspecified: Secondary | ICD-10-CM | POA: Insufficient documentation

## 2014-03-01 MED ORDER — AZITHROMYCIN 250 MG PO TABS: 250.0000 mg | ORAL_TABLET | Freq: Every day | ORAL | Status: DC

## 2014-03-01 MED ORDER — PREDNISONE 50 MG PO TABS
60.0000 mg | ORAL_TABLET | Freq: Once | ORAL | Status: AC
  Administered 2014-03-01: 60 mg via ORAL
  Filled 2014-03-01 (×2): qty 1

## 2014-03-01 MED ORDER — IPRATROPIUM-ALBUTEROL 0.5-2.5 (3) MG/3ML IN SOLN
3.0000 mL | Freq: Once | RESPIRATORY_TRACT | Status: AC
  Administered 2014-03-01: 3 mL via RESPIRATORY_TRACT
  Filled 2014-03-01: qty 3

## 2014-03-01 MED ORDER — GUAIFENESIN-CODEINE 100-10 MG/5ML PO SYRP: 10.0000 mL | ORAL_SOLUTION | Freq: Three times a day (TID) | ORAL | Status: DC | PRN

## 2014-03-01 MED ORDER — ALBUTEROL SULFATE HFA 108 (90 BASE) MCG/ACT IN AERS
2.0000 | INHALATION_SPRAY | Freq: Once | RESPIRATORY_TRACT | Status: AC
  Administered 2014-03-01: 2 via RESPIRATORY_TRACT
  Filled 2014-03-01: qty 6.7

## 2014-03-01 MED ORDER — PREDNISONE 10 MG PO TABS: ORAL_TABLET | ORAL | Status: DC

## 2014-03-01 MED ORDER — ALBUTEROL SULFATE (2.5 MG/3ML) 0.083% IN NEBU
2.5000 mg | INHALATION_SOLUTION | Freq: Once | RESPIRATORY_TRACT | Status: AC
  Administered 2014-03-01: 2.5 mg via RESPIRATORY_TRACT
  Filled 2014-03-01: qty 3

## 2014-03-01 NOTE — ED Provider Notes (Signed)
CSN: [id number]     Arrival date & time 03/01/14  1740 History   First MD Initiated Contact with Patient 03/01/14 0912     Chief Complaint  Patient presents with  . Cough     (Consider location/radiation/quality/duration/timing/severity/associated sxs/prior Treatment) Patient is a 43 y.o. female presenting with cough.  Cough Associated symptoms: chills, myalgias, rhinorrhea, sore throat and wheezing   Associated symptoms: no ear pain, no fever, no headaches, no rash and no shortness of breath      Jennifer Rollins is a 43 y.o. female who presents to the Emergency Department complaining of sinus pressure, nasal congestion, body aches, chills and fever with occasionally productive cough for one week. She states the symptoms are usually worse at night and she has difficulty breathing through her nose. She states that she has been taking over-the-counter NyQuil and ibuprofen without relief. She states the symptoms are worse with lying down. She denies any fever, chest pain, persistent shortness of breath, abdominal pain or vomiting.      Past Medical History  Diagnosis Date  . Sciatica   . Sciatica of right side   . PTSD (post-traumatic stress disorder)   . Depression    Past Surgical History  Procedure Laterality Date  . Cesarean section    . Cholecystectomy    . Tubal ligation     Family History  Problem Relation Age of Onset  . Diabetes Mother   . Heart attack Mother    History  Substance Use Topics  . Smoking status: Current Some Day Smoker -- 1.00 packs/day for 30 years    Types: Cigarettes  . Smokeless tobacco: Never Used  . Alcohol Use: No   OB History   Grav Para Term Preterm Abortions TAB SAB Ect Mult Living   4 2 2  2     2      Review of Systems  Constitutional: Positive for chills. Negative for fever, activity change and appetite change.  HENT: Positive for congestion, rhinorrhea, sinus pressure and sore throat. Negative for ear pain, facial swelling,  trouble swallowing and voice change.   Eyes: Negative for visual disturbance.  Respiratory: Positive for cough, chest tightness and wheezing. Negative for shortness of breath and stridor.   Gastrointestinal: Negative for nausea, vomiting and abdominal pain.  Genitourinary: Negative for dysuria and flank pain.  Musculoskeletal: Positive for myalgias. Negative for neck pain and neck stiffness.  Skin: Negative.  Negative for rash.  Neurological: Negative for dizziness, weakness, numbness and headaches.  Hematological: Negative for adenopathy.  Psychiatric/Behavioral: Negative for confusion.  All other systems reviewed and are negative.     Allergies  Latex  Home Medications   Prior to Admission medications   Medication Sig Start Date End Date Taking? Authorizing Provider  ARIPiprazole (ABILIFY) 5 MG tablet Take 5 mg by mouth daily.   Yes Historical Provider, MD  levothyroxine (SYNTHROID, LEVOTHROID) 75 MCG tablet Take 75 mcg by mouth daily before breakfast.   Yes Historical Provider, MD  lovastatin (MEVACOR) 20 MG tablet Take 40 mg by mouth at bedtime.   Yes Historical Provider, MD  sertraline (ZOLOFT) 100 MG tablet Take 200 mg by mouth at bedtime.   Yes Historical Provider, MD  traZODone (DESYREL) 100 MG tablet Take 100 mg by mouth at bedtime.   Yes Historical Provider, MD   BP 146/66  Pulse 58  Temp(Src) 97.9 F (36.6 C) (Oral)  Resp 16  Ht 5\' 5"  (1.651 m)  Wt 185 lb (83.915 kg)  BMI 30.79 kg/m2  SpO2 97%  LMP 02/24/2014 Physical Exam  Nursing note and vitals reviewed. Constitutional: She is oriented to person, place, and time. She appears well-developed and well-nourished. No distress.  HENT:  Head: Normocephalic and atraumatic.  Right Ear: Tympanic membrane and ear canal normal.  Left Ear: Ear canal normal. No mastoid tenderness.  Nose: Mucosal edema and rhinorrhea present. Right sinus exhibits maxillary sinus tenderness. Left sinus exhibits maxillary sinus tenderness.   Mouth/Throat: Uvula is midline and mucous membranes are normal. No trismus in the jaw. No uvula swelling. Posterior oropharyngeal erythema present. No oropharyngeal exudate, posterior oropharyngeal edema or tonsillar abscesses.  Air-fluid levels to the left TM. No full or erythema.  Eyes: Conjunctivae are normal. Pupils are equal, round, and reactive to light.  Neck: Normal range of motion and phonation normal. Neck supple. No Brudzinski's sign and no Kernig's sign noted.  Cardiovascular: Normal rate, regular rhythm, normal heart sounds and intact distal pulses.   No murmur heard. Pulmonary/Chest: Effort normal and breath sounds normal. No respiratory distress. She has no wheezes. She has no rales.  Coarse lung sounds bilaterally with few inspiratory wheezes throughout  Abdominal: Soft. She exhibits no distension. There is no tenderness. There is no rebound and no guarding.  Musculoskeletal: She exhibits no edema.  Lymphadenopathy:    She has no cervical adenopathy.  Neurological: She is alert and oriented to person, place, and time. She exhibits normal muscle tone. Coordination normal.  Skin: Skin is warm and dry.    ED Course  Procedures (including critical care time) Labs Review Labs Reviewed - No data to display  Imaging Review No results found.   EKG Interpretation None      MDM   Final diagnoses:  Cough  Acute maxillary sinusitis, recurrence not specified     Patient is nontoxic appearing. Lung sounds have improved after albuterol nebulizer treatment. Vital signs are stable. No tachycardia or hypoxia. No concerning symptoms for PE. Patient agrees to prescription for prednisone taper, Zithromax, and Robitussin-AC. Albuterol inhaler was dispensed for home use. She agrees to followup with her primary care physician for recheck or return here if needed.   Richie Bonanno L. Jennifer Finlayson, PA-C 03/01/14 1001

## 2014-03-01 NOTE — ED Notes (Signed)
Pt states trouble with sinuses, nonproductive cough, body aches and chills. Symptoms x 1 week. Nyquil and ibuprofen at home without relief.

## 2014-03-01 NOTE — Discharge Instructions (Signed)
Cough, Adult  A cough is a reflex. It helps you clear your throat and airways. A cough can help heal your body. A cough can last 2 or 3 weeks (acute) or may last more than 8 weeks (chronic). Some common causes of a cough can include an infection, allergy, or a cold. HOME CARE  Only take medicine as told by your doctor.  If given, take your medicines (antibiotics) as told. Finish them even if you start to feel better.  Use a cold steam vaporizer or humidifier in your home. This can help loosen thick spit (secretions).  Sleep so you are almost sitting up (semi-upright). Use pillows to do this. This helps reduce coughing.  Rest as needed.  Stop smoking if you smoke. GET HELP RIGHT AWAY IF:  You have yellowish-white fluid (pus) in your thick spit.  Your cough gets worse.  Your medicine does not reduce coughing, and you are losing sleep.  You cough up blood.  You have trouble breathing.  Your pain gets worse and medicine does not help.  You have a fever. MAKE SURE YOU:   Understand these instructions.  Will watch your condition.  Will get help right away if you are not doing well or get worse. Document Released: 01/24/2011 Document Revised: 09/27/2013 Document Reviewed: 01/24/2011 [facility] Patient Information 2015 [place], [place]. This information is not intended to replace advice given to you by your health care provider. Make sure you discuss any questions you have with your health care provider.  Sinusitis Sinusitis is redness, soreness, and puffiness (inflammation) of the air pockets in the bones of your face (sinuses). The redness, soreness, and puffiness can cause air and mucus to get trapped in your sinuses. This can allow germs to grow and cause an infection.  HOME CARE   Drink enough fluids to keep your pee (urine) clear or pale yellow.  Use a humidifier in your home.  Run a hot shower to create steam in the bathroom. Sit in the bathroom with the door closed.  Breathe in the steam 3-4 times a day.  Put a warm, moist washcloth on your face 3-4 times a day, or as told by your doctor.  Use salt water sprays (saline sprays) to wet the thick fluid in your nose. This can help the sinuses drain.  Only take medicine as told by your doctor. GET HELP RIGHT AWAY IF:   Your pain gets worse.  You have very bad headaches.  You are sick to your stomach (nauseous).  You throw up (vomit).  You are very sleepy (drowsy) all the time.  Your face is puffy (swollen).  Your vision changes.  You have a stiff neck.  You have trouble breathing. MAKE SURE YOU:   Understand these instructions.  Will watch your condition.  Will get help right away if you are not doing well or get worse. Document Released: 10/30/2007 Document Revised: 02/05/2012 Document Reviewed: 12/17/2011 [facility] Patient Information 2015 [place], [place]. This information is not intended to replace advice given to you by your health care provider. Make sure you discuss any questions you have with your health care provider.

## 2014-03-02 NOTE — ED Provider Notes (Signed)
Medical screening examination/treatment/procedure(s) were performed by non-physician practitioner and as supervising physician I was immediately available for consultation/collaboration.   EKG Interpretation None       Nat Christen, MD 03/02/14 [phone]

## 2014-03-28 ENCOUNTER — Encounter (HOSPITAL_COMMUNITY): Payer: Self-pay | Admitting: Emergency Medicine

## 2014-04-23 ENCOUNTER — Encounter (HOSPITAL_COMMUNITY): Payer: Self-pay | Admitting: Emergency Medicine

## 2014-04-23 ENCOUNTER — Emergency Department (HOSPITAL_COMMUNITY)
Admission: EM | Admit: 2014-04-23 | Discharge: 2014-04-23 | Disposition: A | Discharge status: Home / Self Care | Payer: Self-pay | Attending: Emergency Medicine | Admitting: Emergency Medicine

## 2014-04-23 ENCOUNTER — Emergency Department (HOSPITAL_COMMUNITY): Payer: Self-pay

## 2014-04-23 DIAGNOSIS — M25512 Pain in left shoulder: Secondary | ICD-10-CM | POA: Insufficient documentation

## 2014-04-23 DIAGNOSIS — Z792 Long term (current) use of antibiotics: Secondary | ICD-10-CM | POA: Insufficient documentation

## 2014-04-23 DIAGNOSIS — Z91040 Latex allergy status: Secondary | ICD-10-CM | POA: Insufficient documentation

## 2014-04-23 DIAGNOSIS — Z720 Tobacco use: Secondary | ICD-10-CM | POA: Insufficient documentation

## 2014-04-23 DIAGNOSIS — F329 Major depressive disorder, single episode, unspecified: Secondary | ICD-10-CM | POA: Insufficient documentation

## 2014-04-23 DIAGNOSIS — F4310 Post-traumatic stress disorder, unspecified: Secondary | ICD-10-CM | POA: Insufficient documentation

## 2014-04-23 DIAGNOSIS — Z79899 Other long term (current) drug therapy: Secondary | ICD-10-CM | POA: Insufficient documentation

## 2014-04-23 MED ORDER — OXYCODONE-ACETAMINOPHEN 5-325 MG PO TABS
1.0000 | ORAL_TABLET | Freq: Once | ORAL | Status: AC
  Administered 2014-04-23: 1 via ORAL
  Filled 2014-04-23: qty 1

## 2014-04-23 MED ORDER — HYDROCODONE-ACETAMINOPHEN 5-325 MG PO TABS: 1.0000 | ORAL_TABLET | Freq: Four times a day (QID) | ORAL | Status: DC | PRN

## 2014-04-23 MED ORDER — NAPROXEN 500 MG PO TABS: 500.0000 mg | ORAL_TABLET | Freq: Two times a day (BID) | ORAL | Status: DC

## 2014-04-23 NOTE — ED Provider Notes (Signed)
CSN: [id number]     Arrival date & time 04/23/14  1116 History   First MD Initiated Contact with Patient 04/23/14 1149     Chief Complaint  Patient presents with  . Shoulder Pain     (Consider location/radiation/quality/duration/timing/severity/associated sxs/prior Treatment) Patient is a 43 y.o. female presenting with shoulder pain. The history is provided by the patient.  Shoulder Pain Location:  Shoulder Time since incident:  3 days Injury: no   Shoulder location:  L shoulder Pain details:    Quality:  Aching and sharp   Radiates to:  L shoulder   Severity:  Moderate   Onset quality:  Gradual   Timing:  Constant   Progression:  Worsening Chronicity:  New Handedness:  Right-handed Dislocation: no   Foreign body present:  No foreign bodies Prior injury to area:  No Relieved by:  Nothing Worsened by:  Movement Ineffective treatments:  None tried  CENIYA FOWERS is a 43 y.o. female who presents to the ED with left shoulder pain that started 3 days ago. She states that the pain is in the collar bone and she thinks it may be broken. She does not remember any injury. The pain is not going to the right side of the neck and the left arm. The pain is worse with cough, and movement. She has not taken anything for pain. She denies nausea, vomiting, chest pain or other problems.   Past Medical History  Diagnosis Date  . Sciatica   . Sciatica of right side   . PTSD (post-traumatic stress disorder)   . Depression    Past Surgical History  Procedure Laterality Date  . Cesarean section    . Cholecystectomy    . Tubal ligation     Family History  Problem Relation Age of Onset  . Diabetes Mother   . Heart attack Mother    History  Substance Use Topics  . Smoking status: Current Some Day Smoker -- 1.00 packs/day for 30 years    Types: Cigarettes  . Smokeless tobacco: Never Used  . Alcohol Use: No   OB History    Gravida Para Term Preterm AB TAB SAB Ectopic Multiple  Living   4 2 2  2     2      Review of Systems  Musculoskeletal:       Left scapula pain that radiates to the left side of neck.   all other systems negative    Allergies  Latex  Home Medications   Prior to Admission medications   Medication Sig Start Date End Date Taking? Authorizing Provider  ARIPiprazole (ABILIFY) 5 MG tablet Take 5 mg by mouth daily.    Historical Provider, MD  azithromycin (ZITHROMAX) 250 MG tablet Take 1 tablet (250 mg total) by mouth daily. Take first 2 tablets together, then 1 every day until finished. 03/01/14   Tammy L. Triplett, PA-C  guaiFENesin-codeine (ROBITUSSIN AC) 100-10 MG/5ML syrup Take 10 mLs by mouth 3 (three) times daily as needed. 03/01/14   Tammy L. Triplett, PA-C  levothyroxine (SYNTHROID, LEVOTHROID) 75 MCG tablet Take 75 mcg by mouth daily before breakfast.    Historical Provider, MD  lovastatin (MEVACOR) 20 MG tablet Take 40 mg by mouth at bedtime.    Historical Provider, MD  predniSONE (DELTASONE) 10 MG tablet Take 6 tablets day one, 5 tablets day two, 4 tablets day three, 3 tablets day four, 2 tablets day five, then 1 tablet day six 03/01/14   Tammy L. Triplett,  PA-C  sertraline (ZOLOFT) 100 MG tablet Take 200 mg by mouth at bedtime.    Historical Provider, MD  traZODone (DESYREL) 100 MG tablet Take 100 mg by mouth at bedtime.    Historical Provider, MD   BP 114/74 mmHg  Temp(Src) 97.7 F (36.5 C) (Oral)  Resp 18  Ht 5\' 5"  (1.651 m)  Wt 185 lb (83.915 kg)  BMI 30.79 kg/m2  SpO2 100%  LMP 04/18/2014 Physical Exam  Constitutional: She is oriented to person, place, and time. She appears well-developed and well-nourished.  HENT:  Head: Normocephalic and atraumatic.  Eyes: Conjunctivae and EOM are normal.  Neck: Normal range of motion. Neck supple.  Cardiovascular: Normal rate and regular rhythm.   Pulmonary/Chest: Effort normal and breath sounds normal.    Tender over left clavicle with palpation and range of motion of the shoulder.    Abdominal: Soft. There is no tenderness.  Musculoskeletal: Normal range of motion.       Cervical back: She exhibits normal range of motion, no tenderness and no spasm.  Radial pulses equal, adequate circulation, good touch sensation, strong grips and equal bilateral.   Neurological: She is alert and oriented to person, place, and time. No cranial nerve deficit.  Skin: Skin is warm and dry.  Psychiatric: She has a normal mood and affect. Her behavior is normal.  Nursing note and vitals reviewed.   ED Course  Procedures  Dg Clavicle Left  04/23/2014   CLINICAL DATA:  Left clavicle pain for 3 days, no known injury  EXAM: LEFT CLAVICLE - 2+ VIEWS  COMPARISON:  None.  FINDINGS: Two views of left clavicle submitted. No acute fracture or subluxation. No radiopaque foreign body. AC joint is preserved.  IMPRESSION: Negative.   Electronically Signed   By: Lahoma Crocker M.D.   On: 04/23/2014 13:12     MDM  43 y.o. female with left clavicle pain that started 3 days ago and has gotten worse. Pain with movement and palpation. Stable for discharge without cardiac symptoms or neuro deficits. full range of motion of neck.  Discussed with the patient clinical and x-ray findings and plan of care and all questioned fully answered. She will follow up with her PCP or return here if any problems arise.     [address] [place], NP 04/23/14 [place], MD 04/27/14 1017

## 2014-04-23 NOTE — ED Notes (Signed)
Having pain to left shoulder for last 3 days.  Pain is increasing to arm and neck.  Rates pain 8.  Have not taken any meds.

## 2014-07-06 ENCOUNTER — Emergency Department (HOSPITAL_COMMUNITY)
Admission: EM | Admit: 2014-07-06 | Discharge: 2014-07-06 | Disposition: A | Discharge status: Home / Self Care | Payer: BLUE CROSS/BLUE SHIELD | Attending: Emergency Medicine | Admitting: Emergency Medicine

## 2014-07-06 ENCOUNTER — Emergency Department (HOSPITAL_COMMUNITY): Payer: BLUE CROSS/BLUE SHIELD

## 2014-07-06 ENCOUNTER — Encounter (HOSPITAL_COMMUNITY): Payer: Self-pay

## 2014-07-06 DIAGNOSIS — Z720 Tobacco use: Secondary | ICD-10-CM | POA: Insufficient documentation

## 2014-07-06 DIAGNOSIS — F329 Major depressive disorder, single episode, unspecified: Secondary | ICD-10-CM | POA: Insufficient documentation

## 2014-07-06 DIAGNOSIS — Z8739 Personal history of other diseases of the musculoskeletal system and connective tissue: Secondary | ICD-10-CM | POA: Insufficient documentation

## 2014-07-06 DIAGNOSIS — R52 Pain, unspecified: Secondary | ICD-10-CM | POA: Diagnosis present

## 2014-07-06 DIAGNOSIS — J209 Acute bronchitis, unspecified: Secondary | ICD-10-CM | POA: Diagnosis not present

## 2014-07-06 DIAGNOSIS — F4310 Post-traumatic stress disorder, unspecified: Secondary | ICD-10-CM | POA: Diagnosis not present

## 2014-07-06 LAB — URINALYSIS, ROUTINE W REFLEX MICROSCOPIC
Bilirubin Urine: NEGATIVE
GLUCOSE, UA: NEGATIVE mg/dL
HGB URINE DIPSTICK: NEGATIVE
Ketones, ur: NEGATIVE mg/dL
Leukocytes, UA: NEGATIVE
Nitrite: NEGATIVE
PH: 5.5 (ref 5.0–8.0)
Protein, ur: NEGATIVE mg/dL
Specific Gravity, Urine: 1.025 (ref 1.005–1.030)
UROBILINOGEN UA: 0.2 mg/dL (ref 0.0–1.0)

## 2014-07-06 MED ORDER — SODIUM CHLORIDE 0.9 % IV BOLUS (SEPSIS)
1000.0000 mL | Freq: Once | INTRAVENOUS | Status: AC
  Administered 2014-07-06: 1000 mL via INTRAVENOUS

## 2014-07-06 MED ORDER — ONDANSETRON 8 MG PO TBDP
8.0000 mg | ORAL_TABLET | Freq: Once | ORAL | Status: AC
  Administered 2014-07-06: 8 mg via ORAL
  Filled 2014-07-06: qty 1

## 2014-07-06 MED ORDER — AZITHROMYCIN 250 MG PO TABS: 250.0000 mg | ORAL_TABLET | Freq: Every day | ORAL | Status: DC

## 2014-07-06 MED ORDER — DIAZEPAM 5 MG PO TABS
5.0000 mg | ORAL_TABLET | Freq: Once | ORAL | Status: AC
  Administered 2014-07-06: 5 mg via ORAL
  Filled 2014-07-06: qty 1

## 2014-07-06 MED ORDER — HYDROCOD POLST-CHLORPHEN POLST 10-8 MG/5ML PO LQCR: 5.0000 mL | Freq: Two times a day (BID) | ORAL | Status: DC | PRN

## 2014-07-06 MED ORDER — HYDROCOD POLST-CHLORPHEN POLST 10-8 MG/5ML PO LQCR
5.0000 mL | Freq: Once | ORAL | Status: AC
  Administered 2014-07-06: 5 mL via ORAL
  Filled 2014-07-06: qty 5

## 2014-07-06 NOTE — ED Provider Notes (Signed)
CSN: [id number]     Arrival date & time 07/06/14  1648 History   First MD Initiated Contact with Patient 07/06/14 1756     Chief Complaint  Patient presents with  . Generalized Body Aches     (Consider location/radiation/quality/duration/timing/severity/associated sxs/prior Treatment) The history is provided by the patient.   Jennifer Rollins is a 44 y.o. female presenting with a 5 day history of genralized body aches, subjective fever, congested sounding cough but nonproductive along with burning pain midsternum and mid back triggered by cough episodes.  She denies shortness of breath or wheezing. She also endorses nausea with emesis x 1 today and has a decreased appetite. She is concerned about being dehydrated, stating she has transient lightheadedness when she first stands and has only urinated twice today.  She denies abdominal pain, vomiting, dysuria, sore throat, headache and upper respiratory congestion.  She has taken no medications for her symptoms.  She had gradual onset of symptoms.  She did not get a flu shot this season.  She has a 30 pack year history, current smoker.    Past Medical History  Diagnosis Date  . Sciatica   . Sciatica of right side   . PTSD (post-traumatic stress disorder)   . Depression    Past Surgical History  Procedure Laterality Date  . Cesarean section    . Cholecystectomy    . Tubal ligation     Family History  Problem Relation Age of Onset  . Diabetes Mother   . Heart attack Mother    History  Substance Use Topics  . Smoking status: Current Some Day Smoker -- 1.00 packs/day for 30 years    Types: Cigarettes  . Smokeless tobacco: Never Used  . Alcohol Use: No   OB History    Gravida Para Term Preterm AB TAB SAB Ectopic Multiple Living   4 2 2  2     2      Review of Systems  Constitutional: Positive for fever and fatigue.  HENT: Negative for congestion and sore throat.   Eyes: Negative.   Respiratory: Positive for cough. Negative  for chest tightness, shortness of breath and wheezing.   Cardiovascular: Positive for chest pain.  Gastrointestinal: Positive for nausea and vomiting. Negative for abdominal pain, diarrhea and constipation.  Genitourinary: Positive for decreased urine volume.  Musculoskeletal: Negative for joint swelling, arthralgias and neck pain.  Skin: Negative.  Negative for rash and wound.  Neurological: Negative for dizziness, weakness, light-headedness, numbness and headaches.  Psychiatric/Behavioral: Negative.       Allergies  Latex  Home Medications   Prior to Admission medications   Medication Sig Start Date End Date Taking? Authorizing Provider  levothyroxine (SYNTHROID, LEVOTHROID) 75 MCG tablet Take 75 mcg by mouth daily before breakfast.   Yes Historical Provider, MD  sertraline (ZOLOFT) 100 MG tablet Take 150 mg by mouth at bedtime.    Yes Historical Provider, MD  traZODone (DESYREL) 100 MG tablet Take 100 mg by mouth at bedtime.   Yes Historical Provider, MD  azithromycin (ZITHROMAX) 250 MG tablet Take 1 tablet (250 mg total) by mouth daily. Take first 2 tablets together, then 1 every day until finished. 07/06/14   Evalee Jefferson, PA-C  chlorpheniramine-HYDROcodone (TUSSIONEX PENNKINETIC ER) 10-8 MG/5ML LQCR Take 5 mLs by mouth every 12 (twelve) hours as needed for cough. 07/06/14   Evalee Jefferson, PA-C  guaiFENesin-codeine (ROBITUSSIN AC) 100-10 MG/5ML syrup Take 10 mLs by mouth 3 (three) times daily as needed. Patient not  taking: Reported on 04/23/2014 03/01/14   Tammy L. Triplett, PA-C  HYDROcodone-acetaminophen (NORCO) 5-325 MG per tablet Take 1 tablet by mouth every 6 (six) hours as needed for moderate pain. Patient not taking: Reported on 07/06/2014 04/23/14   Ashley Murrain, NP  naproxen (NAPROSYN) 500 MG tablet Take 1 tablet (500 mg total) by mouth 2 (two) times daily. Patient not taking: Reported on 07/06/2014 04/23/14   Ashley Murrain, NP  predniSONE (DELTASONE) 10 MG tablet Take 6 tablets day  one, 5 tablets day two, 4 tablets day three, 3 tablets day four, 2 tablets day five, then 1 tablet day six Patient not taking: Reported on 04/23/2014 03/01/14   Tammy L. Triplett, PA-C   BP 109/62 mmHg  Pulse 59  Temp(Src) 98 F (36.7 C) (Oral)  Resp 16  Ht 5\' 5"  (1.651 m)  Wt 185 lb (83.915 kg)  BMI 30.79 kg/m2  SpO2 100%  LMP 06/02/2014 (Approximate) Physical Exam  ED Course  Procedures (including critical care time) Labs Review Labs Reviewed  URINALYSIS, ROUTINE W REFLEX MICROSCOPIC    Imaging Review Dg Chest 2 View  07/06/2014   CLINICAL DATA:  Cough, chills, and generalized body aches for 1 week. Smoker for 30 years.  EXAM: CHEST  2 VIEW  COMPARISON:  None.  FINDINGS: The heart size and mediastinal contours are within normal limits. Both lungs are clear. The visualized skeletal structures are unremarkable.  IMPRESSION: No active cardiopulmonary disease.   Electronically Signed   By: Lucienne Capers M.D.   On: 07/06/2014 19:21     EKG Interpretation None      MDM   Final diagnoses:  Acute bronchitis, unspecified organism    Prior to dc, patient reports out of her trazodone and can pickup refill in 2 days.which she takes prn to help her sleep.  Asking for medicine to help with this.  Also mentions nocturnal muscle spasms in lower extremities which is chronic long standing problem.  Valium 1 tab given prior to dc home.  Husband driving.  Scheduled to establish care with new pcp in 1 week - Dr Anastasio Champion.    Patients labs and/or radiological studies were viewed and considered during the medical decision making and disposition process.   Pt was given NS IV fluids, also tolerated PO fluids.  Labs stable, no ketonuria, specifc gravity normal range.  Exam and sx c/w acute bronchitis. Given smoking hx will cover with zithromax.  tussionex for cough and bronchitis pain.  Plan f/u with new pcp next week as planned.  The patient appears reasonably screened and/or stabilized for  discharge and I doubt any other medical condition or other [facility] requiring further screening, evaluation, or treatment in the ED at this time prior to discharge.   Evalee Jefferson, PA-C 07/07/14 [place] Alvino Chapel, MD 07/07/14 1506

## 2014-07-06 NOTE — ED Notes (Signed)
Pt pass fluid challenge w/o any difficulties.

## 2014-07-06 NOTE — Discharge Instructions (Signed)

## 2014-07-06 NOTE — ED Notes (Signed)
Pt c/o of generalized body aches, fatigue, nonproductive cough, vomit times once today, denies blood.

## 2014-07-29 ENCOUNTER — Encounter: Payer: Self-pay | Admitting: Obstetrics & Gynecology

## 2014-07-29 ENCOUNTER — Ambulatory Visit (INDEPENDENT_AMBULATORY_CARE_PROVIDER_SITE_OTHER): Payer: BLUE CROSS/BLUE SHIELD | Admitting: Obstetrics & Gynecology

## 2014-07-29 VITALS — BP 120/80 | HR 72 | Ht 65.0 in | Wt 188.0 lb

## 2014-07-29 DIAGNOSIS — R896 Abnormal cytological findings in specimens from other organs, systems and tissues: Secondary | ICD-10-CM | POA: Diagnosis not present

## 2014-07-29 DIAGNOSIS — N921 Excessive and frequent menstruation with irregular cycle: Secondary | ICD-10-CM | POA: Diagnosis not present

## 2014-07-29 DIAGNOSIS — Z3200 Encounter for pregnancy test, result unknown: Secondary | ICD-10-CM

## 2014-07-29 DIAGNOSIS — IMO0002 Reserved for concepts with insufficient information to code with codable children: Secondary | ICD-10-CM

## 2014-07-29 DIAGNOSIS — N946 Dysmenorrhea, unspecified: Secondary | ICD-10-CM

## 2014-07-29 DIAGNOSIS — Z3202 Encounter for pregnancy test, result negative: Secondary | ICD-10-CM | POA: Diagnosis not present

## 2014-07-29 LAB — POCT URINE PREGNANCY: PREG TEST UR: NEGATIVE

## 2014-07-29 MED ORDER — MEGESTROL ACETATE 40 MG PO TABS: ORAL_TABLET | ORAL | Status: DC

## 2014-07-29 NOTE — Progress Notes (Signed)
Patient ID: Jennifer Rollins, female   DOB: [birth year], 44 y.o.   MRN: [id number]   Chief Complaint  Patient presents with  . Colposcopy     HPI:     Problem #1:  Pt had a Pap smear last March which revealed high grade SIL.  Colp and bx performed and bx report states"specimen did not survive processing" She has not had follow up since that time.  She does no have a history of abnormal Pap smears otherwise.  Colposcopy Procedure Note  Indications: Pap smear 11 months ago showed: unsure, I do not have a colpo report specifically. The prior pap showed no abnormalities.  Prior cervical/vaginal disease: na. Prior cervical treatment: no treatment.  Procedure Details  The risks and benefits of the procedure and Written informed consent obtained.  Speculum placed in vagina and excellent visualization of cervix achieved, cervix swabbed x 3 with acetic acid solution.  Colposcopy was inadequate, no squamocolumnar junction seen.  Findings: Cervix: no visible lesions, no mosaicism, no punctation and no abnormal vasculature; no biopsies taken. Vaginal inspection: normal without visible lesions. Vulvar colposcopy: vulvar colposcopy not performed.  Specimens: None  Complications: none.  Plan: Because of high grade SIL cells seen on Pap with inadequate colposcopy needs cervical conization both for therapeutic and diagnostic indications.  Problem #2; Pt states her periods have been extremely heavy and painful over the past 4-5 years. Location:  pelvic. Quality:  crampy. Severity:  Severe with clots soils clothes and sheets, difficult to get out of bed and go to work. Timing:  Monthly  Duration:  5-6 days. Context:  Menstrual period. Modifying factors:  Ibuprofen does not help Signs/Symptoms:    Problem Pertinent ROS:       No dyspareunia, no pelvic pain other than with her period No burning with urination, frequency or urgency No nausea, vomiting or diarrhea Nor fever chills or other  constitutional symptoms  Extended ROS:      Review of Systems  Constitutional: Negative for fever, chills, weight loss, malaise/fatigue and diaphoresis.  HENT: Negative for hearing loss, ear pain, nosebleeds, congestion, sore throat, neck pain, tinnitus and ear discharge.   Eyes: Negative for blurred vision, double vision, photophobia, pain, discharge and redness.  Respiratory: Negative for cough, hemoptysis, sputum production, shortness of breath, wheezing and stridor.   Cardiovascular: Negative for chest pain, palpitations, orthopnea, claudication, leg swelling and PND.  Gastrointestinal: Positive for abdominal pain. Negative for heartburn, nausea, vomiting, diarrhea, constipation, blood in stool and melena.  Genitourinary: Negative for dysuria, urgency, frequency, hematuria and flank pain.  Musculoskeletal: Negative for myalgias, back pain, joint pain and falls.  Skin: Negative for itching and rash.  Neurological: Negative for dizziness, tingling, tremors, sensory change, speech change, focal weakness, seizures, loss of consciousness, weakness and headaches.  Endo/Heme/Allergies: Negative for environmental allergies and polydipsia. Does not bruise/bleed easily.  Psychiatric/Behavioral: Negative for depression, suicidal ideas, hallucinations, memory loss and substance abuse. The patient is not nervous/anxious and does not have insomnia.       [place]:             Past Medical History  Diagnosis Date  . Sciatica   . Sciatica of right side   . PTSD (post-traumatic stress disorder)   . Depression   . Abnormal Pap smear of cervix   . HPV in female     Past Surgical History  Procedure Laterality Date  . Cesarean section    . Cholecystectomy    . Tubal ligation    .  Colposcopy vulva w/ biopsy      OB History    Gravida Para Term Preterm AB TAB SAB Ectopic Multiple Living   4 2 2  2     2       Allergies  Allergen Reactions  . Latex Other (See Comments)    History    Social History  . Marital Status: Married    Spouse Name: N/A  . Number of Children: N/A  . Years of Education: N/A   Social History Main Topics  . Smoking status: Current Some Day Smoker -- 1.00 packs/day for 30 years    Types: Cigarettes  . Smokeless tobacco: Never Used  . Alcohol Use: No  . Drug Use: No  . Sexual Activity: Yes    Birth Control/ Protection: Surgical   Other Topics Concern  . None   Social History Narrative    Family History  Problem Relation Age of Onset  . Diabetes Mother   . Heart attack Mother   . COPD Mother      Examination:  Vitals:  Blood pressure 120/80, pulse 72, height 5\' 5"  (1.651 m), weight 188 lb (85.276 kg), last menstrual period 06/30/2014.    Physical Examination:     Abdomen:  Soft, non-tender, normal bowel sounds; no bruits, organomegaly or masses. Skin: normal Vulva:  NEFG, normal appearing vulva with no masses, tenderness or lesions,  Vagina:  normal mucosa, pink, no discharge Cervix colpo above, no lesions Uterus NSSC non tender Adnexa normal sized ovaries, no masses otherwise non tender Urethra normal no lesions     DATA orders and reviews: Labs were not ordered today:   Imaging studies were ordered today:  Sonogram 1 month  Lab tests were reviewed today:   Biopsy from [place] last year Imaging studies were not reviewed today:    I did not independently review/view images, tracing or specimen(not simply the report) myself.  Prescription Drug Management:  New Prescriptions: Megestrol algorithm 120 x 5, 80 x 5, then 40 mg daily Renewed Prescriptions:   Current prescription changes:     Impression/Plan(Problem Based): 1.  High grade SIL, inadequate colposcopy      (new problem) : Additional workup is needed:  Will need conization of the cervix for diagnostic and therapeutic indications  2.  Menrrhagia      (new problem) : Additional workup is needed:  Megestrol response + sonogram 1 month, labs to be done as  well  3.   Dysmenorrhea      (new problem) : Additional workup is needed:  Sonogram, megestrol response  Follow Up:   1  months   MDM:  3 new problems, new prescription drug therapy, imaging to be done, and ultimately surgical evaluation will be needed

## 2014-08-01 ENCOUNTER — Other Ambulatory Visit (HOSPITAL_COMMUNITY): Payer: Self-pay | Admitting: Family Medicine

## 2014-08-01 ENCOUNTER — Telehealth: Payer: Self-pay | Admitting: Obstetrics & Gynecology

## 2014-08-01 ENCOUNTER — Ambulatory Visit (HOSPITAL_COMMUNITY)
Admission: RE | Admit: 2014-08-01 | Discharge: 2014-08-01 | Disposition: A | Discharge status: Home / Self Care | Payer: BLUE CROSS/BLUE SHIELD | Source: Ambulatory Visit | Attending: Family Medicine | Admitting: Family Medicine

## 2014-08-01 DIAGNOSIS — M546 Pain in thoracic spine: Secondary | ICD-10-CM | POA: Insufficient documentation

## 2014-08-01 DIAGNOSIS — R0781 Pleurodynia: Secondary | ICD-10-CM | POA: Insufficient documentation

## 2014-08-01 DIAGNOSIS — M549 Dorsalgia, unspecified: Secondary | ICD-10-CM

## 2014-08-01 MED ORDER — KETOROLAC TROMETHAMINE 10 MG PO TABS: 10.0000 mg | ORAL_TABLET | Freq: Three times a day (TID) | ORAL | Status: DC | PRN

## 2014-08-01 NOTE — Telephone Encounter (Signed)
Pt states Dr. Elonda Husky gave RX for Megace for vaginal bleeding now having severe cramping not relieved with Tylenol or Advil.   Pt requesting something for Cramping.

## 2014-08-01 NOTE — Telephone Encounter (Signed)
Rx toradol for cramps  As far as the cramps goes yes it can happen even without the bleeding, hopefully will improve, stay on the megestrol

## 2014-08-02 NOTE — Telephone Encounter (Signed)
Pt informed Toradol e-scribed to [place], [place]. Pt continue Megestrol. Pt verbalized understanding.

## 2014-08-11 ENCOUNTER — Encounter: Payer: Self-pay | Admitting: Internal Medicine

## 2014-08-17 ENCOUNTER — Other Ambulatory Visit (HOSPITAL_COMMUNITY): Payer: Self-pay | Admitting: Family Medicine

## 2014-08-17 DIAGNOSIS — R0781 Pleurodynia: Secondary | ICD-10-CM

## 2014-08-18 ENCOUNTER — Telehealth: Payer: Self-pay | Admitting: Obstetrics & Gynecology

## 2014-08-18 NOTE — Telephone Encounter (Signed)
Pt c/o vaginal bleeding, crampings, pain upper legs. Pt states she has been taking Megace 40 mg daily. Please advise.

## 2014-08-18 NOTE — Telephone Encounter (Signed)
Take megace 40 2 tabs daily and keep scheduled sonogram and appointment

## 2014-08-19 ENCOUNTER — Ambulatory Visit (HOSPITAL_COMMUNITY)
Admission: RE | Admit: 2014-08-19 | Discharge: 2014-08-19 | Disposition: A | Discharge status: Home / Self Care | Payer: BLUE CROSS/BLUE SHIELD | Source: Ambulatory Visit | Attending: Family Medicine | Admitting: Family Medicine

## 2014-08-19 DIAGNOSIS — R0781 Pleurodynia: Secondary | ICD-10-CM | POA: Insufficient documentation

## 2014-08-19 DIAGNOSIS — Z8541 Personal history of malignant neoplasm of cervix uteri: Secondary | ICD-10-CM | POA: Diagnosis not present

## 2014-08-19 MED ORDER — IOHEXOL 300 MG/ML  SOLN
80.0000 mL | Freq: Once | INTRAMUSCULAR | Status: AC | PRN
  Administered 2014-08-19: 80 mL via INTRAVENOUS

## 2014-08-22 ENCOUNTER — Telehealth: Payer: Self-pay | Admitting: *Deleted

## 2014-08-22 NOTE — Telephone Encounter (Signed)
Duplicate message. 

## 2014-08-22 NOTE — Telephone Encounter (Signed)
Informed pt to take megace 40 mg 2 tablet daily per Dr. Elonda Husky.  Pt states does not have anymore refills, requesting refill. Called [facility] in [place] pt does have refills on the Megace.

## 2014-08-22 NOTE — Telephone Encounter (Signed)
Pt informed of Dr. Elonda Husky recommendation.

## 2014-08-23 ENCOUNTER — Ambulatory Visit (INDEPENDENT_AMBULATORY_CARE_PROVIDER_SITE_OTHER): Payer: BLUE CROSS/BLUE SHIELD | Admitting: Obstetrics & Gynecology

## 2014-08-23 ENCOUNTER — Encounter: Payer: Self-pay | Admitting: Obstetrics & Gynecology

## 2014-08-23 VITALS — BP 100/70 | HR 76 | Wt 186.0 lb

## 2014-08-23 DIAGNOSIS — N946 Dysmenorrhea, unspecified: Secondary | ICD-10-CM | POA: Diagnosis not present

## 2014-08-23 DIAGNOSIS — N939 Abnormal uterine and vaginal bleeding, unspecified: Secondary | ICD-10-CM

## 2014-08-23 LAB — POCT HEMOGLOBIN: Hemoglobin: 12.2 g/dL (ref 12.2–16.2)

## 2014-08-23 MED ORDER — HYDROCODONE-ACETAMINOPHEN 5-325 MG PO TABS: 1.0000 | ORAL_TABLET | Freq: Four times a day (QID) | ORAL | Status: DC | PRN

## 2014-08-23 NOTE — Progress Notes (Signed)
Patient ID: Jennifer Rollins, female   DOB: 08/07/1970, 44 y.o.   MRN: [id number]   Work in appointment due to pain  Chief Complaint  Patient presents with  . work-in with pain    c/c vaginal bleeding since Thursday night. Taking Megace.     HPI:    44 y.o. [id number] Patient's last menstrual period was 08/19/2014.  Heavy bleeding cramping since last Thursday, instructed by phone to increase megace to 80 mg daily which she states she has done Location:  Vaginal . Quality:  Cramps, back pain. Severity:  Moderate to severe. Timing:  constant. Duration:  5 days. Context:  . Modifying factors:  Unresponsive to OTC NSAIDs Signs/Symptoms:      Current outpatient prescriptions:  .  calcium-vitamin D 250-100 MG-UNIT per tablet, Take 1 tablet by mouth 2 (two) times daily., Disp: , Rfl:  .  megestrol (MEGACE) 40 MG tablet, 3 tablets a day for 5 days, 2 tablets a day for 5 days then 1 tablet daily, Disp: 45 tablet, Rfl: 3 .  sertraline (ZOLOFT) 100 MG tablet, Take 150 mg by mouth at bedtime. , Disp: , Rfl:  .  traZODone (DESYREL) 100 MG tablet, Take 100 mg by mouth at bedtime., Disp: , Rfl:  .  HYDROcodone-acetaminophen (NORCO/VICODIN) 5-325 MG per tablet, Take 1 tablet by mouth every 6 (six) hours as needed., Disp: 30 tablet, Rfl: 0 .  ketorolac (TORADOL) 10 MG tablet, Take 1 tablet (10 mg total) by mouth every 8 (eight) hours as needed. (Patient not taking: Reported on 08/23/2014), Disp: 15 tablet, Rfl: 0 .  levothyroxine (SYNTHROID, LEVOTHROID) 75 MCG tablet, Take 75 mcg by mouth daily before breakfast., Disp: , Rfl:   Problem Pertinent ROS:       No burning with urination, frequency or urgency No nausea, vomiting or diarrhea Nor fever chills or other constitutional symptoms   Extended ROS:        [place]:             Past Medical History  Diagnosis Date  . Sciatica   . Sciatica of right side   . PTSD (post-traumatic stress disorder)   . Depression   . Abnormal Pap smear of  cervix   . HPV in female     Past Surgical History  Procedure Laterality Date  . Cesarean section    . Cholecystectomy    . Tubal ligation    . Colposcopy vulva w/ biopsy      OB History    Gravida Para Term Preterm AB TAB SAB Ectopic Multiple Living   4 2 2  2     2       Allergies  Allergen Reactions  . Latex Other (See Comments)    History   Social History  . Marital Status: Married    Spouse Name: N/A  . Number of Children: N/A  . Years of Education: N/A   Social History Main Topics  . Smoking status: Current Some Day Smoker -- 1.00 packs/day for 30 years    Types: Cigarettes  . Smokeless tobacco: Never Used  . Alcohol Use: No  . Drug Use: No  . Sexual Activity: Yes    Birth Control/ Protection: Surgical   Other Topics Concern  . None   Social History Narrative    Family History  Problem Relation Age of Onset  . Diabetes Mother   . Heart attack Mother   . COPD Mother      Examination:  Vitals:  Blood pressure 100/70, pulse 76, weight 186 lb (84.369 kg), last menstrual period 08/19/2014.    Physical Examination:     General Appearance:  WDWN in mild distress  Vulva:  normal appearing vulva with no masses, tenderness or lesions Vagina:  normal mucosa, scant blood Cervix:  multiparous appearance, no cervical motion tenderness and no lesions Uterus:  normal size, contour, position, consistency, mobility, non-tender Adnexa: ovaries:present,  normal adnexa in size, nontender and no masses     DATA orders and reviews: Labs were ordered today:  Hemoglobin 12.2 Imaging studies were not ordered today:  Already ordered for next week  Lab tests were reviewed today:    Imaging studies were not reviewed today:    I did not independently review/view images, tracing or specimen(not simply the report) myself.  Prescription Drug Management:  New Prescriptions: lortab Renewed Prescriptions:   Current prescription changes:  Megace 40 mg, 2  daily   Impression/Plan(Problem Based): 1.  Menometroehgia      (follow up of a pre-existing problem: worsening) : Additional workup is needed:  Sonogram next week  2.  Dysmenorrhea      (follow up of a pre-existing problem:are worsening) : Additional workup is needed:  Sonogram next week already scheduled}     Follow Up:   1  weeks

## 2014-08-30 ENCOUNTER — Ambulatory Visit (INDEPENDENT_AMBULATORY_CARE_PROVIDER_SITE_OTHER): Payer: BLUE CROSS/BLUE SHIELD | Admitting: Obstetrics & Gynecology

## 2014-08-30 ENCOUNTER — Other Ambulatory Visit: Payer: Self-pay | Admitting: Obstetrics & Gynecology

## 2014-08-30 ENCOUNTER — Ambulatory Visit (INDEPENDENT_AMBULATORY_CARE_PROVIDER_SITE_OTHER): Payer: BLUE CROSS/BLUE SHIELD

## 2014-08-30 ENCOUNTER — Encounter: Payer: Self-pay | Admitting: Obstetrics & Gynecology

## 2014-08-30 VITALS — BP 120/80 | HR 76 | Wt 185.0 lb

## 2014-08-30 DIAGNOSIS — G8929 Other chronic pain: Secondary | ICD-10-CM

## 2014-08-30 DIAGNOSIS — N921 Excessive and frequent menstruation with irregular cycle: Secondary | ICD-10-CM | POA: Diagnosis not present

## 2014-08-30 DIAGNOSIS — N949 Unspecified condition associated with female genital organs and menstrual cycle: Secondary | ICD-10-CM | POA: Diagnosis not present

## 2014-08-30 DIAGNOSIS — N939 Abnormal uterine and vaginal bleeding, unspecified: Secondary | ICD-10-CM | POA: Diagnosis not present

## 2014-08-30 DIAGNOSIS — R896 Abnormal cytological findings in specimens from other organs, systems and tissues: Secondary | ICD-10-CM

## 2014-08-30 DIAGNOSIS — N946 Dysmenorrhea, unspecified: Secondary | ICD-10-CM

## 2014-08-30 DIAGNOSIS — N941 Dyspareunia: Secondary | ICD-10-CM

## 2014-08-30 DIAGNOSIS — R102 Pelvic and perineal pain: Principal | ICD-10-CM

## 2014-08-30 DIAGNOSIS — IMO0002 Reserved for concepts with insufficient information to code with codable children: Secondary | ICD-10-CM

## 2014-08-30 MED ORDER — PIROXICAM 20 MG PO CAPS: 20.0000 mg | ORAL_CAPSULE | Freq: Every day | ORAL | Status: DC

## 2014-08-30 MED ORDER — HYDROCODONE-ACETAMINOPHEN 5-325 MG PO TABS: 1.0000 | ORAL_TABLET | Freq: Four times a day (QID) | ORAL | Status: DC | PRN

## 2014-08-30 NOTE — Progress Notes (Signed)
Patient ID: Jennifer Rollins, female   DOB: Feb 13, 1971, 44 y.o.   MRN: [id number] Preoperative History and Physical  JILL STOPKA is a 44 y.o. [phone] with Patient's last menstrual period was 08/19/2014. 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  Sonogram is normal  PMH:    Past Medical History  Diagnosis Date  . Sciatica   . Sciatica of right side   . PTSD (post-traumatic stress disorder)   . Depression   . Abnormal Pap smear of cervix   . HPV in female     PSH:     Past Surgical History  Procedure Laterality Date  . Cesarean section    . Cholecystectomy    . Tubal ligation    . Colposcopy vulva w/ biopsy      POb/GynH:      OB History    Gravida Para Term Preterm AB TAB SAB Ectopic Multiple Living   4 2 2  2     2       SH:   History  Substance Use Topics  . Smoking status: Current Some Day Smoker -- 1.00 packs/day for 30 years    Types: Cigarettes  . Smokeless tobacco: Never Used  . Alcohol Use: No    FH:    Family History  Problem Relation Age of Onset  . Diabetes Mother   . Heart attack Mother   . COPD Mother      Allergies:  Allergies  Allergen Reactions  . Latex Other (See Comments)    Medications:       Current outpatient prescriptions:  .  calcium-vitamin D 250-100 MG-UNIT per tablet, Take 1 tablet by mouth 2 (two) times daily., Disp: , Rfl:  .  HYDROcodone-acetaminophen (NORCO/VICODIN) 5-325 MG per tablet, Take 1 tablet by mouth every 6 (six) hours as needed., Disp: 30 tablet, Rfl: 0 .  levothyroxine (SYNTHROID, LEVOTHROID) 75 MCG tablet, Take 75 mcg by mouth daily before breakfast., Disp: , Rfl:  .  megestrol (MEGACE) 40 MG tablet, 3 tablets a day for 5 days, 2 tablets a day for 5 days then 1 tablet daily, Disp: 45 tablet, Rfl: 3 .  sertraline (ZOLOFT) 100 MG tablet, Take 150 mg by mouth at bedtime. , Disp: , Rfl:  .  traZODone  (DESYREL) 100 MG tablet, Take 100 mg by mouth at bedtime., Disp: , Rfl:  .  ketorolac (TORADOL) 10 MG tablet, Take 1 tablet (10 mg total) by mouth every 8 (eight) hours as needed. (Patient not taking: Reported on 08/23/2014), Disp: 15 tablet, Rfl: 0 .  piroxicam (FELDENE) 20 MG capsule, Take 1 capsule (20 mg total) by mouth daily., Disp: 30 capsule, Rfl: 1  Review of Systems:   Review of Systems  Constitutional: Negative for fever, chills, weight loss, malaise/fatigue and diaphoresis.  HENT: Negative for hearing loss, ear pain, nosebleeds, congestion, sore throat, neck pain, tinnitus and ear discharge.   Eyes: Negative for blurred vision, double vision, photophobia, pain, discharge and redness.  Respiratory: Negative for cough, hemoptysis, sputum production, shortness of breath, wheezing and stridor.   Cardiovascular: Negative for chest pain, palpitations, orthopnea, claudication, leg swelling and PND.  Gastrointestinal: Positive for abdominal pain. Negative for heartburn, nausea, vomiting, diarrhea, constipation, blood in stool and melena.  Genitourinary: Negative for dysuria, urgency, frequency, hematuria and flank pain.  Musculoskeletal: Negative for myalgias, back pain, joint pain and falls.  Skin: Negative for itching  and rash.  Neurological: Negative for dizziness, tingling, tremors, sensory change, speech change, focal weakness, seizures, loss of consciousness, weakness and headaches.  Endo/Heme/Allergies: Negative for environmental allergies and polydipsia. Does not bruise/bleed easily.  Psychiatric/Behavioral: Negative for depression, suicidal ideas, hallucinations, memory loss and substance abuse. The patient is not nervous/anxious and does not have insomnia.      PHYSICAL EXAM:  Blood pressure 120/80, pulse 76, weight 185 lb (83.915 kg), last menstrual period 08/19/2014.    Vitals reviewed. Constitutional: She is oriented to person, place, and time. She appears well-developed  and well-nourished.  HENT:  Head: Normocephalic and atraumatic.  Right Ear: External ear normal.  Left Ear: External ear normal.  Nose: Nose normal.  Mouth/Throat: Oropharynx is clear and moist.  Eyes: Conjunctivae and EOM are normal. Pupils are equal, round, and reactive to light. Right eye exhibits no discharge. Left eye exhibits no discharge. No scleral icterus.  Neck: Normal range of motion. Neck supple. No tracheal deviation present. No thyromegaly present.  Cardiovascular: Normal rate, regular rhythm, normal heart sounds and intact distal pulses.  Exam reveals no gallop and no friction rub.   No murmur heard. Respiratory: Effort normal and breath sounds normal. No respiratory distress. She has no wheezes. She has no rales. She exhibits no tenderness.  GI: Soft. Bowel sounds are normal. She exhibits no distension and no mass. There is tenderness. There is no rebound and no guarding.  Genitourinary:       Vulva is normal without lesions Vagina is pink moist without discharge Cervix normal in appearance and pap is normal Uterus is NSSC tender to palpation Adnexa is negative with normal sized ovaries by sonogram  Musculoskeletal: Normal range of motion. She exhibits no edema and no tenderness.  Neurological: She is alert and oriented to person, place, and time. She has normal reflexes. She displays normal reflexes. No cranial nerve deficit. She exhibits normal muscle tone. Coordination normal.  Skin: Skin is warm and dry. No rash noted. No erythema. No pallor.  Psychiatric: She has a normal mood and affect. Her behavior is normal. Judgment and thought content normal.    Labs: Results for orders placed or performed in visit on 08/23/14 (from the past 336 hour(s))  POCT hemoglobin   Collection Time: 08/23/14  2:50 PM  Result Value Ref Range   Hemoglobin 12.2 12.2 - 16.2 g/dL    EKG: No orders found for this or any previous visit.  Imaging Studies: Dg Ribs Unilateral W/chest  Right  08/01/2014   CLINICAL DATA:  Right-sided rib pain and anterior chest pain.  EXAM: RIGHT RIBS AND CHEST - 3+ VIEW  COMPARISON:  Chest x-ray on 07/06/2014  FINDINGS: Frontal chest radiograph shows stable vague area of nodularity in the lateral left lower chest measuring roughly 6-7 mm. No evidence of infiltrate, edema, effusion or pneumothorax. The heart size and mediastinal contours are within normal limits.  Oblique right rib films show no evidence of rib fracture or bony lesion.  IMPRESSION: Normal right ribs. No acute findings. Vague area of nodularity on frontal chest radiograph in the region of the left lateral lung measuring roughly 6-7 mm. Cannot exclude small pulmonary nodule. Recommend correlation with CT of the chest.   Electronically Signed   By: Aletta Edouard M.D.   On: 08/01/2014 16:42   Dg Thoracic Spine W/swimmers  08/01/2014   CLINICAL DATA:  Mid back pain for 6 months, no known injury, initial encounter  EXAM: [place]  COMPARISON:  None.  FINDINGS: There is no evidence of thoracic spine fracture. Alignment is normal. No other significant bone abnormalities are identified.  IMPRESSION: No acute abnormality noted.   Electronically Signed   By: Inez Catalina M.D.   On: 08/01/2014 16:40   Ct Chest W Contrast  08/19/2014   CLINICAL DATA:  44 year old female with right-sided pleuritic chest pain for the past several months, with left-sided chest pain for the past 3 days. History of cervical cancer.  EXAM: CT CHEST WITH CONTRAST  TECHNIQUE: Multidetector CT imaging of the chest was performed during intravenous contrast administration.  CONTRAST:  63mL OMNIPAQUE IOHEXOL 300 MG/ML  SOLN  COMPARISON:  No priors.  FINDINGS: Mediastinum/Lymph Nodes: Heart size is normal. There is no significant pericardial fluid, thickening or pericardial calcification. No pathologically enlarged mediastinal or hilar lymph nodes. Multiple calcified left hilar lymph nodes. Esophagus is  unremarkable in appearance. No axillary lymphadenopathy.  Lungs/Pleura: 6 mm left lower lobe pulmonary nodule (image 32 of series 3). No other suspicious appearing pulmonary nodules or masses are noted. No acute consolidative airspace disease. No pleural effusions.  Upper Abdomen: Status post cholecystectomy.  Musculoskeletal/Soft Tissues: There are no aggressive appearing lytic or blastic lesions noted in the visualized portions of the skeleton.  IMPRESSION: 1. No acute findings in the thorax to account for the patient's symptoms. 2. 6 mm pulmonary nodule in the lateral aspect of the left lower lobe (image 32 of series 3). If the patient is at high risk for bronchogenic carcinoma, follow-up chest CT at 6-12 months is recommended. If the patient is at low risk for bronchogenic carcinoma, follow-up chest CT at 12 months is recommended. This recommendation follows the consensus statement: Guidelines for Management of Small Pulmonary Nodules Detected on CT Scans: A Statement from the [place] as published in Radiology 2005;237:395-400.   Electronically Signed   By: Vinnie Langton M.D.   On: 08/19/2014 17:30   US Transvaginal Non-ob  08/30/2014   GYNECOLOGIC SONOGRAM   ZHANAE PROFFIT is a 44 y.o. [id number] LMP 08/19/2014 for a pelvic sonogram  for heavy vag.bleeding for fourteen days.  Uterus                      9.39 x 5.55 x 3.52 cm, anteverted,heterogenous  Endometrium          4.7 mm,  Asymmetrical w  A echogenic nodule adj to  the endometrium ?polyp 4.11mm  Right ovary             2.07 x 1.4 x 1.2 cm, normal  Left ovary                2 x 1.23 x 1.33 cm, normal    Technician Comments:  Pelvic US: heterogenous anteverted uterus,eec 4.63mm w a echogenic nodule  adj to endometrium 4.54mm ?polyp,normal ov's bilat ,no free fluid     Silver Huguenin 08/30/2014 4:25 PM    US Pelvis Complete  08/30/2014   GYNECOLOGIC SONOGRAM   CHINA DEITRICK is a 44 y.o. [id number] LMP 08/19/2014 for a pelvic sonogram  for heavy  vag.bleeding for fourteen days.  Uterus                      9.39 x 5.55 x 3.52 cm, anteverted,heterogenous  Endometrium          4.7 mm,  Asymmetrical w  A echogenic nodule adj to  the endometrium ?polyp 4.39mm  Right ovary             2.07 x 1.4 x 1.2 cm, normal  Left ovary                2 x 1.23 x 1.33 cm, normal    Technician Comments:  Pelvic US: heterogenous anteverted uterus,eec 4.39mm w a echogenic nodule  adj to endometrium 4.75mm ?polyp,normal ov's bilat ,no free fluid     Silver Huguenin 08/30/2014 4:25 PM       Assessment: HSIL, recurrent,, inadequate colposcopy CPP, midline and lateral dyspareunia  Plan: TAHBSO  EURE,LUTHER H 08/30/2014 5:01 PM

## 2014-08-30 NOTE — Progress Notes (Signed)
Pelvic US: heterogenous anteverted uterus,eec 4.52mm w a echogenic nodule adj to endometrium 4.31mm ?polyp,normal ov's bilat ,no free fluid

## 2014-08-31 ENCOUNTER — Telehealth: Payer: Self-pay | Admitting: Obstetrics & Gynecology

## 2014-08-31 NOTE — Telephone Encounter (Signed)
Pt states she was calling to thank Dr. Elonda Husky for the Anti-inflammatory medication, "She is feeling much better."   Pt also states she is going to be scheduled with Dr. Elonda Husky for surgery and like to have excessive labia skin removed at the same time. Is this possible?

## 2014-09-05 ENCOUNTER — Ambulatory Visit: Payer: BLUE CROSS/BLUE SHIELD | Admitting: Gastroenterology

## 2014-09-06 ENCOUNTER — Telehealth: Payer: Self-pay | Admitting: *Deleted

## 2014-09-07 ENCOUNTER — Telehealth: Payer: Self-pay | Admitting: *Deleted

## 2014-09-07 NOTE — Telephone Encounter (Signed)
FMLA completed and faxed.

## 2014-09-07 NOTE — Telephone Encounter (Signed)
FMLA forms completed and faxed today.

## 2014-09-09 ENCOUNTER — Other Ambulatory Visit: Payer: Self-pay | Admitting: Obstetrics & Gynecology

## 2014-09-09 ENCOUNTER — Encounter (HOSPITAL_COMMUNITY)
Admission: RE | Admit: 2014-09-09 | Discharge: 2014-09-09 | Disposition: A | Discharge status: Home / Self Care | Payer: BLUE CROSS/BLUE SHIELD | Source: Ambulatory Visit | Attending: Obstetrics & Gynecology | Admitting: Obstetrics & Gynecology

## 2014-09-09 ENCOUNTER — Encounter (HOSPITAL_COMMUNITY): Payer: Self-pay

## 2014-09-09 DIAGNOSIS — N941 Dyspareunia: Secondary | ICD-10-CM | POA: Diagnosis not present

## 2014-09-09 DIAGNOSIS — N939 Abnormal uterine and vaginal bleeding, unspecified: Secondary | ICD-10-CM | POA: Insufficient documentation

## 2014-09-09 DIAGNOSIS — Z01812 Encounter for preprocedural laboratory examination: Secondary | ICD-10-CM | POA: Insufficient documentation

## 2014-09-09 DIAGNOSIS — R896 Abnormal cytological findings in specimens from other organs, systems and tissues: Secondary | ICD-10-CM | POA: Insufficient documentation

## 2014-09-09 DIAGNOSIS — G8929 Other chronic pain: Secondary | ICD-10-CM | POA: Insufficient documentation

## 2014-09-09 HISTORY — DX: Anxiety disorder, unspecified: F41.9

## 2014-09-09 HISTORY — DX: Anemia, unspecified: D64.9

## 2014-09-09 LAB — HCG, QUANTITATIVE, PREGNANCY: hCG, Beta Chain, Quant, S: <1 m[IU]/mL (ref <5)

## 2014-09-09 LAB — URINALYSIS, ROUTINE W REFLEX MICROSCOPIC
Bilirubin Urine: NEGATIVE
Glucose, UA: NEGATIVE mg/dL
Hgb urine dipstick: NEGATIVE
Ketones, ur: NEGATIVE mg/dL
Leukocytes, UA: NEGATIVE
NITRITE: NEGATIVE
PROTEIN: NEGATIVE mg/dL
SPECIFIC GRAVITY, URINE: 1.025 (ref 1.005–1.030)
Urobilinogen, UA: 0.2 mg/dL (ref 0.0–1.0)
pH: 5.5 (ref 5.0–8.0)

## 2014-09-09 LAB — CBC
HCT: 39.4 % (ref 36.0–46.0)
HEMOGLOBIN: 12.9 g/dL (ref 12.0–15.0)
MCH: 28.9 pg (ref 26.0–34.0)
MCHC: 32.7 g/dL (ref 30.0–36.0)
MCV: 88.1 fL (ref 78.0–100.0)
Platelets: 412 10*3/uL — ABNORMAL HIGH (ref 150–400)
RBC: 4.47 MIL/uL (ref 3.87–5.11)
RDW: 15 % (ref 11.5–15.5)
WBC: 9.8 10*3/uL (ref 4.0–10.5)

## 2014-09-09 LAB — COMPREHENSIVE METABOLIC PANEL
ALBUMIN: 3.6 g/dL (ref 3.5–5.2)
ALT: 46 U/L — ABNORMAL HIGH (ref 0–35)
AST: 35 U/L (ref 0–37)
Alkaline Phosphatase: 141 U/L — ABNORMAL HIGH (ref 39–117)
Anion gap: 10 (ref 5–15)
BUN: 15 mg/dL (ref 6–23)
CO2: 23 mmol/L (ref 19–32)
CREATININE: 1.12 mg/dL — AB (ref 0.50–1.10)
Calcium: 9.4 mg/dL (ref 8.4–10.5)
Chloride: 105 mmol/L (ref 96–112)
GFR calc Af Amer: 68 mL/min — ABNORMAL LOW (ref >90)
GFR calc non Af Amer: 58 mL/min — ABNORMAL LOW (ref >90)
GLUCOSE: 78 mg/dL (ref 70–99)
Potassium: 4.7 mmol/L (ref 3.5–5.1)
Sodium: 138 mmol/L (ref 135–145)
Total Bilirubin: 0.5 mg/dL (ref 0.3–1.2)
Total Protein: 7.5 g/dL (ref 6.0–8.3)

## 2014-09-09 LAB — TYPE AND SCREEN
ABO/RH(D): B POS
Antibody Screen: NEGATIVE

## 2014-09-09 NOTE — Patient Instructions (Addendum)
Jennifer Rollins  09/09/2014   Your procedure is scheduled on: 09/14/2014    Report to [facility] at  31 AM.  Call this number if you have problems the morning of surgery: [phone]   Remember:   Do not eat food or drink liquids after midnight.   Take these medicines the morning of surgery with A SIP OF WATER: hydrocodone, toradol, feldene, zoloft  Do not wear jewelry, make-up or nail polish.  Do not wear lotions, powders, or perfumes.   Do not shave 48 hours prior to surgery. Men may shave face and neck.  Do not bring valuables to the hospital.  [facility] is not responsible for any belongings or valuables.               Contacts, dentures or bridgework may not be worn into surgery.  Leave suitcase in the car. After surgery it may be brought to your room.  For patients admitted to the hospital, discharge time is determined by your treatment team.               Patients discharged the day of surgery will not be allowed to drive home.  Name and phone number of your driver: family  Special Instructions: Shower using CHG 2 nights before surgery and the night before surgery.  If you shower the day of surgery use CHG.  Use special wash - you have one bottle of CHG for all showers.  You should use approximately 1/3 of the bottle for each shower.   Please read over the following fact sheets that you were given: Pain Booklet, Coughing and Deep Breathing, Surgical Site Infection Prevention, Anesthesia Post-op Instructions and Care and Recovery After Surgery Bilateral Salpingo-Oophorectomy Bilateral salpingo-oophorectomy is the surgical removal of both fallopian tubes and both ovaries. The ovaries are small organs that produce eggs in women. The fallopian tubes transport the egg from the ovary to the womb (uterus). Usually, when this surgery is done, the uterus was previously removed. A bilateral salpingo-oophorectomy may be done to treat cancer or to reduce the risk of cancer in women  who are at high risk. Removing both fallopian tubes and both ovaries will make you unable to become pregnant (sterile). It will also put you into menopause so that you will no longer have menstrual periods and may have menopausal symptoms such as hot flashes, night sweats, and mood changes. It will not affect your sex drive. LET [facility] CARE PROVIDER KNOW ABOUT:  Any allergies you have.  All medicines you are taking, including vitamins, herbs, eye drops, creams, and over-the-counter medicines.  Previous problems you or members of your family have had with the use of anesthetics.  Any blood disorders you have.  Previous surgeries you have had.  Medical conditions you have. RISKS AND COMPLICATIONS Generally, this is a safe procedure. However, as with any procedure, complications can occur. Possible complications include:  Injury to surrounding organs.  Bleeding.  Infection.  Blood clots in the legs or lungs.  Problems related to anesthesia. BEFORE THE PROCEDURE  Ask your health care provider about changing or stopping your regular medicines. You may need to stop taking certain medicines, such as aspirin or blood thinners, at least 1 week before the surgery.  Do not eat or drink anything for at least 8 hours before the surgery.  If you smoke, do not smoke for at least 2 weeks before the surgery.  Make plans to have someone  drive you home after the procedure or after your hospital stay. Also arrange for someone to help you with activities during recovery. PROCEDURE   You will be given medicine to help you relax before the procedure (sedative). You will then be given medicine to make you sleep through the procedure (general anesthetic). These medicines will be given through an IV access tube that is put into one of your veins.  Once you are asleep, your lower abdomen will be shaved and cleaned. A thin, flexible tube (catheter) will be placed in your bladder.  The surgeon may  use a laparoscopic, robotic, or open technique for this surgery:  In the laparoscopic technique, the surgery is done through two small cuts (incisions) in the abdomen. A thin, lighted tube with a tiny camera on the end (laparoscope) is inserted into one of the incisions. The tools needed for the procedure are put through the other incision.  A robotic technique may be chosen to perform complex surgery in a small space. In the robotic technique, small incisions will be made. A camera and surgical instruments are passed through the incisions. Surgical instruments will be controlled with the help of a robotic arm.  In the open technique, the surgery is done through one large incision in the abdomen.  Using any of these techniques, the surgeon removes the fallopian tubes and ovaries. The blood vessels will be clamped and tied.  The surgeon then uses staples or stitches to close the incision or incisions. AFTER THE PROCEDURE  You will be taken to a recovery area where you will be monitored for 1 to 3 hours. Your blood pressure, pulse, and temperature will be checked often. You will remain in the recovery area until you are stable and waking up.  If the laparoscopic technique was used, you may be allowed to go home after several hours. You may have some shoulder pain after the laparoscopic procedure. This is normal and usually goes away in a day or two.  If the open technique was used, you will be admitted to the hospital for a couple of days.  You will be given pain medicine as needed.  The IV access tube and catheter will be removed before you are discharged. Document Released: 05/13/2005 Document Revised: 05/18/2013 Document Reviewed: 11/04/2012 [facility] Patient Information 2015 [place], [place]. This information is not intended to replace advice given to you by your health care provider. Make sure you discuss any questions you have with your health care provider. Abdominal  Hysterectomy Abdominal hysterectomy is a surgical procedure to remove your womb (uterus). Your uterus is the muscular organ that contains a developing baby. This surgery is done for many reasons. You may need an abdominal hysterectomy if you have cancer, growths (tumors), long-term pain, or bleeding. You may also have this procedure if your uterus has slipped down into your vagina (uterine prolapse). Depending on why you need an abdominal hysterectomy, you may also have other reproductive organs removed. These could include the part of your vagina that connects with your uterus (cervix), the organs that make eggs (ovaries), and the tubes that connect the ovaries to the uterus (fallopian tubes). LET [facility] CARE PROVIDER KNOW ABOUT:   Any allergies you have.  All medicines you are taking, including vitamins, herbs, eye drops, creams, and over-the-counter medicines.  Previous problems you or members of your family have had with the use of anesthetics.  Any blood disorders you have.  Previous surgeries you have had.  Medical conditions you  have. RISKS AND COMPLICATIONS Generally, this is a safe procedure. However, as with any procedure, problems can occur. Infection is the most common problem after an abdominal hysterectomy. Other possible problems include:  Bleeding.  Formation of blood clots that may break free and travel to your lungs.  Injury to other organs near your uterus.  Nerve injury causing nerve pain.  Decreased interest in sex or pain during sexual intercourse. BEFORE THE PROCEDURE  Abdominal hysterectomy is a major surgical procedure. It can affect the way you feel about yourself. Talk to your health care provider about the physical and emotional changes hysterectomy may cause.  You may need to have blood work and X-rays done before surgery.  Quit smoking if you smoke. Ask your health care provider for help if you are struggling to quit.  Stop taking medicines that  thin your blood as directed by your health care provider.  You may be instructed to take antibiotic medicines or laxatives before surgery.  Do not eat or drink anything for 6-8 hours before surgery.  Take your regular medicines with a small sip of water.  Bathe or shower the night or morning before surgery. PROCEDURE  Abdominal hysterectomy is done in the operating room at the hospital.  In most cases, you will be given a medicine that makes you go to sleep (general anesthetic).  The surgeon will make a cut (incision) through the skin in your lower belly.  The incision may be about 5-7 inches long. It may go side-to-side or up-and-down.  The surgeon will move aside the body tissue that covers your uterus. The surgeon will then carefully take out your uterus along with any of your other reproductive organs that need to be removed.  Bleeding will be controlled with clamps or sutures.  The surgeon will close your incision with sutures or metal clips. AFTER THE PROCEDURE  You will have some pain immediately after the procedure.  You will be given pain medicine in the recovery room.  You will be taken to your hospital room when you have recovered from the anesthesia.  You may need to stay in the hospital for 2-5 days.  You will be given instructions for recovery at home. Document Released: 05/18/2013 Document Reviewed: 05/18/2013 [facility] Patient Information 2015 [place], [place]. This information is not intended to replace advice given to you by your health care provider. Make sure you discuss any questions you have with your health care provider. General Anesthesia, Care After Refer to this sheet in the next few weeks. These instructions provide you with information on caring for yourself after your procedure. Your health care provider may also give you more specific instructions. Your treatment has been planned according to current medical practices, but problems sometimes occur.  Call your health care provider if you have any problems or questions after your procedure. WHAT TO EXPECT AFTER THE PROCEDURE After the procedure, it is typical to experience:  Sleepiness.  Nausea and vomiting. HOME CARE INSTRUCTIONS  For the first 24 hours after general anesthesia:  Have a responsible person with you.  Do not drive a car. If you are alone, do not take public transportation.  Do not drink alcohol.  Do not take medicine that has not been prescribed by your health care provider.  Do not sign important papers or make important decisions.  You may resume a normal diet and activities as directed by your health care provider.  Change bandages (dressings) as directed.  If you have questions or  problems that seem related to general anesthesia, call the hospital and ask for the anesthetist or anesthesiologist on call. SEEK MEDICAL CARE IF:  You have nausea and vomiting that continue the day after anesthesia.  You develop a rash. SEEK IMMEDIATE MEDICAL CARE IF:   You have difficulty breathing.  You have chest pain.  You have any allergic problems. Document Released: 08/19/2000 Document Revised: 05/18/2013 Document Reviewed: 11/26/2012 [facility] Patient Information 2015 [place], [place]. This information is not intended to replace advice given to you by your health care provider. Make sure you discuss any questions you have with your health care provider.

## 2014-09-09 NOTE — Pre-Procedure Instructions (Signed)
Patient given information to sign up for my chart at home. 

## 2014-09-14 ENCOUNTER — Encounter (HOSPITAL_COMMUNITY)
Admission: RE | Disposition: A | Discharge status: Home / Self Care | Payer: Self-pay | Source: Ambulatory Visit | Attending: Obstetrics & Gynecology

## 2014-09-14 ENCOUNTER — Inpatient Hospital Stay (HOSPITAL_COMMUNITY): Payer: BLUE CROSS/BLUE SHIELD | Admitting: Anesthesiology

## 2014-09-14 ENCOUNTER — Encounter (HOSPITAL_COMMUNITY): Payer: Self-pay | Admitting: *Deleted

## 2014-09-14 ENCOUNTER — Observation Stay (HOSPITAL_COMMUNITY)
Admission: RE | Admit: 2014-09-14 | Discharge: 2014-09-15 | Disposition: A | Discharge status: Home / Self Care | Payer: BLUE CROSS/BLUE SHIELD | Source: Ambulatory Visit | Attending: Obstetrics & Gynecology | Admitting: Obstetrics & Gynecology

## 2014-09-14 DIAGNOSIS — F419 Anxiety disorder, unspecified: Secondary | ICD-10-CM | POA: Diagnosis not present

## 2014-09-14 DIAGNOSIS — Z91040 Latex allergy status: Secondary | ICD-10-CM | POA: Diagnosis not present

## 2014-09-14 DIAGNOSIS — F329 Major depressive disorder, single episode, unspecified: Secondary | ICD-10-CM | POA: Insufficient documentation

## 2014-09-14 DIAGNOSIS — G8929 Other chronic pain: Secondary | ICD-10-CM | POA: Insufficient documentation

## 2014-09-14 DIAGNOSIS — R102 Pelvic and perineal pain: Secondary | ICD-10-CM | POA: Diagnosis not present

## 2014-09-14 DIAGNOSIS — Z90722 Acquired absence of ovaries, bilateral: Secondary | ICD-10-CM

## 2014-09-14 DIAGNOSIS — Z9079 Acquired absence of other genital organ(s): Secondary | ICD-10-CM

## 2014-09-14 DIAGNOSIS — N920 Excessive and frequent menstruation with regular cycle: Secondary | ICD-10-CM | POA: Insufficient documentation

## 2014-09-14 DIAGNOSIS — N800 Endometriosis of uterus: Secondary | ICD-10-CM | POA: Diagnosis not present

## 2014-09-14 DIAGNOSIS — F4310 Post-traumatic stress disorder, unspecified: Secondary | ICD-10-CM | POA: Insufficient documentation

## 2014-09-14 DIAGNOSIS — R87613 High grade squamous intraepithelial lesion on cytologic smear of cervix (HGSIL): Principal | ICD-10-CM | POA: Insufficient documentation

## 2014-09-14 DIAGNOSIS — D251 Intramural leiomyoma of uterus: Secondary | ICD-10-CM | POA: Diagnosis not present

## 2014-09-14 DIAGNOSIS — A630 Anogenital (venereal) warts: Secondary | ICD-10-CM | POA: Insufficient documentation

## 2014-09-14 DIAGNOSIS — N946 Dysmenorrhea, unspecified: Secondary | ICD-10-CM | POA: Insufficient documentation

## 2014-09-14 DIAGNOSIS — D259 Leiomyoma of uterus, unspecified: Secondary | ICD-10-CM | POA: Diagnosis not present

## 2014-09-14 DIAGNOSIS — Z79899 Other long term (current) drug therapy: Secondary | ICD-10-CM | POA: Insufficient documentation

## 2014-09-14 DIAGNOSIS — F17210 Nicotine dependence, cigarettes, uncomplicated: Secondary | ICD-10-CM | POA: Insufficient documentation

## 2014-09-14 DIAGNOSIS — N941 Dyspareunia: Secondary | ICD-10-CM | POA: Diagnosis not present

## 2014-09-14 DIAGNOSIS — N840 Polyp of corpus uteri: Secondary | ICD-10-CM | POA: Diagnosis not present

## 2014-09-14 DIAGNOSIS — N831 Corpus luteum cyst: Secondary | ICD-10-CM | POA: Diagnosis not present

## 2014-09-14 DIAGNOSIS — D649 Anemia, unspecified: Secondary | ICD-10-CM | POA: Insufficient documentation

## 2014-09-14 DIAGNOSIS — N870 Mild cervical dysplasia: Secondary | ICD-10-CM | POA: Diagnosis not present

## 2014-09-14 DIAGNOSIS — M5431 Sciatica, right side: Secondary | ICD-10-CM | POA: Diagnosis not present

## 2014-09-14 DIAGNOSIS — Z90710 Acquired absence of both cervix and uterus: Secondary | ICD-10-CM

## 2014-09-14 HISTORY — PX: ABDOMINAL HYSTERECTOMY: SHX81

## 2014-09-14 HISTORY — PX: SALPINGOOPHORECTOMY: SHX82

## 2014-09-14 SURGERY — HYSTERECTOMY, ABDOMINAL
Anesthesia: General | Site: Abdomen

## 2014-09-14 MED ORDER — ONDANSETRON 8 MG/NS 50 ML IVPB
8.0000 mg | Freq: Four times a day (QID) | INTRAVENOUS | Status: DC | PRN
  Filled 2014-09-14: qty 8

## 2014-09-14 MED ORDER — MIDAZOLAM HCL 2 MG/2ML IJ SOLN
1.0000 mg | INTRAMUSCULAR | Status: DC | PRN
  Administered 2014-09-14 (×2): 2 mg via INTRAVENOUS
  Filled 2014-09-14: qty 2

## 2014-09-14 MED ORDER — LIDOCAINE HCL (PF) 1 % IJ SOLN
INTRAMUSCULAR | Status: AC
  Filled 2014-09-14: qty 10

## 2014-09-14 MED ORDER — PROPOFOL 10 MG/ML IV BOLUS
INTRAVENOUS | Status: DC | PRN
  Administered 2014-09-14: 150 mg via INTRAVENOUS

## 2014-09-14 MED ORDER — NEOSTIGMINE METHYLSULFATE 10 MG/10ML IV SOLN
INTRAVENOUS | Status: DC | PRN
  Administered 2014-09-14: 4 mg via INTRAVENOUS

## 2014-09-14 MED ORDER — SODIUM CHLORIDE 0.9 % IV SOLN
8.0000 mg | Freq: Four times a day (QID) | INTRAVENOUS | Status: DC | PRN
  Filled 2014-09-14: qty 4

## 2014-09-14 MED ORDER — ROCURONIUM BROMIDE 100 MG/10ML IV SOLN
INTRAVENOUS | Status: DC | PRN
  Administered 2014-09-14: 40 mg via INTRAVENOUS
  Administered 2014-09-14 (×2): 10 mg via INTRAVENOUS

## 2014-09-14 MED ORDER — ONDANSETRON HCL 4 MG/2ML IJ SOLN
INTRAMUSCULAR | Status: AC
  Filled 2014-09-14: qty 2

## 2014-09-14 MED ORDER — ONDANSETRON HCL 4 MG PO TABS
8.0000 mg | ORAL_TABLET | Freq: Four times a day (QID) | ORAL | Status: DC | PRN
  Administered 2014-09-15: 8 mg via ORAL
  Filled 2014-09-14: qty 2

## 2014-09-14 MED ORDER — ONDANSETRON HCL 4 MG/2ML IJ SOLN: 4.0000 mg | Freq: Once | INTRAMUSCULAR | Status: DC | PRN

## 2014-09-14 MED ORDER — OXYCODONE-ACETAMINOPHEN 5-325 MG PO TABS
1.0000 | ORAL_TABLET | ORAL | Status: DC | PRN
  Administered 2014-09-15: 2 via ORAL
  Administered 2014-09-15 (×2): 1 via ORAL
  Filled 2014-09-14: qty 2
  Filled 2014-09-14 (×2): qty 1

## 2014-09-14 MED ORDER — LIDOCAINE HCL (PF) 1 % IJ SOLN
INTRAMUSCULAR | Status: AC
  Filled 2014-09-14: qty 5

## 2014-09-14 MED ORDER — ONDANSETRON HCL 4 MG/2ML IJ SOLN
INTRAMUSCULAR | Status: AC
  Filled 2014-09-14: qty 4

## 2014-09-14 MED ORDER — BUPIVACAINE LIPOSOME 1.3 % IJ SUSP
20.0000 mL | Freq: Once | INTRAMUSCULAR | Status: DC
  Filled 2014-09-14: qty 20

## 2014-09-14 MED ORDER — SUFENTANIL CITRATE 50 MCG/ML IV SOLN
INTRAVENOUS | Status: AC
  Filled 2014-09-14: qty 1

## 2014-09-14 MED ORDER — PIROXICAM 10 MG PO CAPS
20.0000 mg | ORAL_CAPSULE | Freq: Every day | ORAL | Status: DC
  Administered 2014-09-15: 20 mg via ORAL
  Filled 2014-09-14 (×3): qty 1

## 2014-09-14 MED ORDER — FENTANYL CITRATE (PF) 100 MCG/2ML IJ SOLN
25.0000 ug | INTRAMUSCULAR | Status: AC | PRN
  Administered 2014-09-14 (×2): 50 ug via INTRAVENOUS

## 2014-09-14 MED ORDER — TRAZODONE HCL 50 MG PO TABS
100.0000 mg | ORAL_TABLET | Freq: Every day | ORAL | Status: DC
  Administered 2014-09-14: 100 mg via ORAL
  Filled 2014-09-14: qty 2

## 2014-09-14 MED ORDER — CEFAZOLIN SODIUM-DEXTROSE 2-3 GM-% IV SOLR
2.0000 g | INTRAVENOUS | Status: AC
  Administered 2014-09-14: 2 g via INTRAVENOUS

## 2014-09-14 MED ORDER — 0.9 % SODIUM CHLORIDE (POUR BTL) OPTIME
TOPICAL | Status: DC | PRN
  Administered 2014-09-14: 2000 mL
  Administered 2014-09-14: 1000 mL

## 2014-09-14 MED ORDER — KETOROLAC TROMETHAMINE 30 MG/ML IJ SOLN
INTRAMUSCULAR | Status: AC
  Filled 2014-09-14: qty 1

## 2014-09-14 MED ORDER — MIDAZOLAM HCL 2 MG/2ML IJ SOLN
INTRAMUSCULAR | Status: AC
  Filled 2014-09-14: qty 2

## 2014-09-14 MED ORDER — PROPOFOL 10 MG/ML IV BOLUS
INTRAVENOUS | Status: AC
  Filled 2014-09-14: qty 20

## 2014-09-14 MED ORDER — SENNOSIDES-DOCUSATE SODIUM 8.6-50 MG PO TABS: 1.0000 | ORAL_TABLET | Freq: Every evening | ORAL | Status: DC | PRN

## 2014-09-14 MED ORDER — BISACODYL 10 MG RE SUPP: 10.0000 mg | Freq: Every day | RECTAL | Status: DC | PRN

## 2014-09-14 MED ORDER — FENTANYL CITRATE (PF) 250 MCG/5ML IJ SOLN
INTRAMUSCULAR | Status: AC
  Filled 2014-09-14: qty 5

## 2014-09-14 MED ORDER — HYDROMORPHONE HCL 1 MG/ML IJ SOLN
1.0000 mg | INTRAMUSCULAR | Status: DC | PRN
  Administered 2014-09-14 (×2): 2 mg via INTRAVENOUS
  Administered 2014-09-14: 1 mg via INTRAVENOUS
  Administered 2014-09-14 – 2014-09-15 (×3): 2 mg via INTRAVENOUS
  Administered 2014-09-15: 1 mg via INTRAVENOUS
  Administered 2014-09-15 (×3): 2 mg via INTRAVENOUS
  Administered 2014-09-15: 1 mg via INTRAVENOUS
  Filled 2014-09-14: qty 1
  Filled 2014-09-14 (×3): qty 2
  Filled 2014-09-14: qty 1
  Filled 2014-09-14 (×4): qty 2
  Filled 2014-09-14: qty 1
  Filled 2014-09-14: qty 2

## 2014-09-14 MED ORDER — GLYCOPYRROLATE 0.2 MG/ML IJ SOLN
INTRAMUSCULAR | Status: AC
Start: 2014-09-14 — End: 2014-09-14
  Filled 2014-09-14: qty 6

## 2014-09-14 MED ORDER — DEXAMETHASONE SODIUM PHOSPHATE 4 MG/ML IJ SOLN
4.0000 mg | Freq: Once | INTRAMUSCULAR | Status: AC
  Administered 2014-09-14: 4 mg via INTRAVENOUS

## 2014-09-14 MED ORDER — GLYCOPYRROLATE 0.2 MG/ML IJ SOLN
INTRAMUSCULAR | Status: DC | PRN
  Administered 2014-09-14: 0.6 mg via INTRAVENOUS

## 2014-09-14 MED ORDER — SUFENTANIL CITRATE 50 MCG/ML IV SOLN
INTRAVENOUS | Status: DC | PRN
  Administered 2014-09-14 (×5): 10 ug via INTRAVENOUS

## 2014-09-14 MED ORDER — FENTANYL CITRATE (PF) 100 MCG/2ML IJ SOLN
INTRAMUSCULAR | Status: DC | PRN
  Administered 2014-09-14 (×2): 50 ug via INTRAVENOUS

## 2014-09-14 MED ORDER — DOCUSATE SODIUM 100 MG PO CAPS
100.0000 mg | ORAL_CAPSULE | Freq: Two times a day (BID) | ORAL | Status: DC
  Administered 2014-09-14 – 2014-09-15 (×2): 100 mg via ORAL
  Filled 2014-09-14 (×2): qty 1

## 2014-09-14 MED ORDER — FENTANYL CITRATE (PF) 100 MCG/2ML IJ SOLN
INTRAMUSCULAR | Status: AC
  Filled 2014-09-14: qty 2

## 2014-09-14 MED ORDER — LACTATED RINGERS IV SOLN
INTRAVENOUS | Status: DC
  Administered 2014-09-14: 1000 mL via INTRAVENOUS
  Administered 2014-09-14 (×2): via INTRAVENOUS

## 2014-09-14 MED ORDER — ROCURONIUM BROMIDE 50 MG/5ML IV SOLN
INTRAVENOUS | Status: AC
  Filled 2014-09-14: qty 1

## 2014-09-14 MED ORDER — BUPIVACAINE LIPOSOME 1.3 % IJ SUSP
INTRAMUSCULAR | Status: DC | PRN
  Administered 2014-09-14: 50 mL

## 2014-09-14 MED ORDER — GLYCOPYRROLATE 0.2 MG/ML IJ SOLN
INTRAMUSCULAR | Status: AC
  Filled 2014-09-14: qty 2

## 2014-09-14 MED ORDER — SERTRALINE HCL 50 MG PO TABS
150.0000 mg | ORAL_TABLET | Freq: Every day | ORAL | Status: DC
  Administered 2014-09-14: 150 mg via ORAL
  Filled 2014-09-14: qty 3

## 2014-09-14 MED ORDER — MEPERIDINE HCL 50 MG/ML IJ SOLN: 6.2500 mg | Freq: Once | INTRAMUSCULAR | Status: DC

## 2014-09-14 MED ORDER — KETOROLAC TROMETHAMINE 30 MG/ML IJ SOLN
30.0000 mg | Freq: Once | INTRAMUSCULAR | Status: AC
  Administered 2014-09-14: 30 mg via INTRAVENOUS

## 2014-09-14 MED ORDER — ONDANSETRON HCL 4 MG/2ML IJ SOLN
4.0000 mg | Freq: Once | INTRAMUSCULAR | Status: AC
  Administered 2014-09-14: 4 mg via INTRAVENOUS

## 2014-09-14 MED ORDER — BUPIVACAINE HCL (PF) 0.5 % IJ SOLN
INTRAMUSCULAR | Status: AC
  Filled 2014-09-14: qty 30

## 2014-09-14 MED ORDER — DEXAMETHASONE SODIUM PHOSPHATE 4 MG/ML IJ SOLN
INTRAMUSCULAR | Status: AC
  Filled 2014-09-14: qty 1

## 2014-09-14 MED ORDER — KCL IN DEXTROSE-NACL 20-5-0.45 MEQ/L-%-% IV SOLN
INTRAVENOUS | Status: DC
  Administered 2014-09-14 (×2): via INTRAVENOUS

## 2014-09-14 MED ORDER — ALUM & MAG HYDROXIDE-SIMETH 200-200-20 MG/5ML PO SUSP: 30.0000 mL | ORAL | Status: DC | PRN

## 2014-09-14 MED ORDER — GLYCOPYRROLATE 0.2 MG/ML IJ SOLN
INTRAMUSCULAR | Status: AC
Start: 2014-09-14 — End: 2014-09-14
  Filled 2014-09-14: qty 1

## 2014-09-14 MED ORDER — ZOLPIDEM TARTRATE 5 MG PO TABS: 5.0000 mg | ORAL_TABLET | Freq: Every evening | ORAL | Status: DC | PRN

## 2014-09-14 MED ORDER — CEFAZOLIN SODIUM-DEXTROSE 2-3 GM-% IV SOLR
INTRAVENOUS | Status: AC
  Filled 2014-09-14: qty 50

## 2014-09-14 MED ORDER — SUCCINYLCHOLINE CHLORIDE 20 MG/ML IJ SOLN
INTRAMUSCULAR | Status: AC
  Filled 2014-09-14: qty 1

## 2014-09-14 MED ORDER — FENTANYL CITRATE (PF) 100 MCG/2ML IJ SOLN
25.0000 ug | INTRAMUSCULAR | Status: DC | PRN
  Administered 2014-09-14 (×4): 50 ug via INTRAVENOUS
  Filled 2014-09-14 (×2): qty 2

## 2014-09-14 MED ORDER — BUPIVACAINE LIPOSOME 1.3 % IJ SUSP
INTRAMUSCULAR | Status: AC
  Filled 2014-09-14: qty 20

## 2014-09-14 SURGICAL SUPPLY — 46 items
APPLIER CLIP 13 LRG OPEN (CLIP)
BAG HAMPER (MISCELLANEOUS) ×4 IMPLANT
CATH FOLEY LATEX FREE 16FR (CATHETERS) ×2
CATH FOLEY LF 16FR (CATHETERS) ×2 IMPLANT
CELLS DAT CNTRL 66122 CELL SVR (MISCELLANEOUS) ×2 IMPLANT
CLIP APPLIE 13 LRG OPEN (CLIP) IMPLANT
CLOTH BEACON ORANGE TIMEOUT ST (SAFETY) ×4 IMPLANT
COVER LIGHT HANDLE STERIS (MISCELLANEOUS) ×8 IMPLANT
DRAPE WARM FLUID 44X44 (DRAPE) ×4 IMPLANT
DRSG OPSITE POSTOP 4X6 (GAUZE/BANDAGES/DRESSINGS) ×4 IMPLANT
DRSG TELFA 3X8 NADH (GAUZE/BANDAGES/DRESSINGS) ×4 IMPLANT
ELECT REM PT RETURN 9FT ADLT (ELECTROSURGICAL) ×4
ELECTRODE REM PT RTRN 9FT ADLT (ELECTROSURGICAL) ×2 IMPLANT
FORMALIN 10 PREFIL 480ML (MISCELLANEOUS) ×4 IMPLANT
GLOVE BIOGEL PI IND STRL 8 (GLOVE) ×2 IMPLANT
GLOVE BIOGEL PI INDICATOR 8 (GLOVE) ×2
GLOVE ECLIPSE 8.0 STRL XLNG CF (GLOVE) ×4 IMPLANT
GOWN STRL REUS W/TWL LRG LVL3 (GOWN DISPOSABLE) ×8 IMPLANT
GOWN STRL REUS W/TWL XL LVL3 (GOWN DISPOSABLE) ×4 IMPLANT
INST SET MAJOR GENERAL (KITS) ×4 IMPLANT
KIT ROOM TURNOVER APOR (KITS) ×4 IMPLANT
LIQUID BAND (GAUZE/BANDAGES/DRESSINGS) ×4 IMPLANT
MANIFOLD NEPTUNE II (INSTRUMENTS) ×4 IMPLANT
NEEDLE HYPO 21X1.5 SAFETY (NEEDLE) ×4 IMPLANT
NS IRRIG 1000ML POUR BTL (IV SOLUTION) ×12 IMPLANT
PACK ABDOMINAL MAJOR (CUSTOM PROCEDURE TRAY) ×4 IMPLANT
PAD ARMBOARD 7.5X6 YLW CONV (MISCELLANEOUS) ×4 IMPLANT
RETRACTOR WND ALEXIS 25 LRG (MISCELLANEOUS) IMPLANT
RTRCTR WOUND ALEXIS 18CM MED (MISCELLANEOUS) ×4
RTRCTR WOUND ALEXIS 25CM LRG (MISCELLANEOUS)
SET BASIN LINEN APH (SET/KITS/TRAYS/PACK) ×4 IMPLANT
STAPLER VISISTAT 35W (STAPLE) ×4 IMPLANT
SUT CHROMIC 0 CT 1 (SUTURE) ×4 IMPLANT
SUT MNCRL+ AB 3-0 CT1 36 (SUTURE) ×4 IMPLANT
SUT MON AB 3-0 SH 27 (SUTURE) IMPLANT
SUT MONOCRYL AB 3-0 CT1 36IN (SUTURE) ×4
SUT PLAIN 2 0 XLH (SUTURE) ×4 IMPLANT
SUT VIC AB 0 CT1 27 (SUTURE) ×6
SUT VIC AB 0 CT1 27XBRD ANTBC (SUTURE) IMPLANT
SUT VIC AB 0 CT1 27XCR 8 STRN (SUTURE) ×6 IMPLANT
SUT VIC AB 0 CTX 36 (SUTURE) ×2
SUT VIC AB 0 CTX36XBRD ANTBCTR (SUTURE) ×2 IMPLANT
SUT VICRYL 3 0 (SUTURE) IMPLANT
TOWEL BLUE STERILE X RAY DET (MISCELLANEOUS) ×4 IMPLANT
TRAY FOLEY CATH 16FR SILVER (SET/KITS/TRAYS/PACK) IMPLANT
TRAY FOLEY CATH SILVER 16FR (SET/KITS/TRAYS/PACK) ×4 IMPLANT

## 2014-09-14 NOTE — Anesthesia Postprocedure Evaluation (Signed)
  Anesthesia Post-op Note  Patient: Jennifer Rollins  Procedure(s) Performed: Procedure(s): HYSTERECTOMY ABDOMINAL (N/A) SALPINGO OOPHORECTOMY (Bilateral)  Patient Location: PACU  Anesthesia Type:General  Level of Consciousness: awake, alert , oriented and patient cooperative  Airway and Oxygen Therapy: Patient Spontanous Breathing and Patient connected to face mask oxygen  Post-op Pain: mild  Post-op Assessment: Post-op Vital signs reviewed, Patient's Cardiovascular Status Stable, Respiratory Function Stable, Patent Airway, No signs of Nausea or vomiting and Pain level controlled  Post-op Vital Signs: Reviewed and stable  Last Vitals:  Filed Vitals:   09/14/14 1245  BP: 113/64  Pulse: 76  Temp:   Resp: 11    Complications: No apparent anesthesia complications

## 2014-09-14 NOTE — H&P (Signed)
Preoperative History and Physical  Jennifer Rollins is a 44 y.o. [phone] with Patient's last menstrual period was 08/19/2014. admitted for a abdominal hysterectomy and removal of both tubes and ovaries.  She has inadequate colposcopy and HSIL.  She has very heavy and painful periods chronic pelvic pain and also dyspareunia The only surgical solution for all of these problems is abdominal hysterectomy with removal of both tubes and ovaries.  She understands this will create surgical menopause.  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 Sciatica   . Sciatica of right side   . PTSD (post-traumatic stress disorder)   . Depression   . Abnormal Pap smear of cervix   . HPV in female   . Anxiety   . Anemia     PSH:     Past Surgical History  Procedure Laterality Date  . Cesarean section    . Cholecystectomy    . Tubal ligation    . Colposcopy vulva w/ biopsy      POb/GynH:      OB History    Gravida Para Term Preterm AB TAB SAB Ectopic Multiple Living   4 2 2  2     2       SH:   History  Substance Use Topics  . Smoking status: Current Some Day Smoker -- 1.00 packs/day for 30 years    Types: Cigarettes  . Smokeless tobacco: Never Used  . Alcohol Use: No    FH:    Family History  Problem Relation Age of Onset  . Diabetes Mother   . Heart attack Mother   . COPD Mother      Allergies:  Allergies  Allergen Reactions  . Latex Other (See Comments)    Medications:       Current facility-administered medications:  .  bupivacaine liposome (EXPAREL) 1.3 % injection 266 mg, 20 mL, Infiltration, Once, Florian Buff, MD .  ceFAZolin (ANCEF) IVPB 2 g/50 mL premix, 2 g, Intravenous, On Call to OR, Florian Buff, MD .  lactated ringers infusion, , Intravenous, Continuous, Lerry Liner, MD, Last Rate: 75 mL/hr at 09/14/14 0907,  1,000 mL at 09/14/14 0907 .  midazolam (VERSED) injection 1-2 mg, 1-2 mg, Intravenous, Q5 Min x 3 PRN, Lerry Liner, MD, 2 mg at 09/14/14 8119  Review of Systems:   Review of Systems  Constitutional: Negative for fever, chills, weight loss, malaise/fatigue and diaphoresis.  HENT: Negative for hearing loss, ear pain, nosebleeds, congestion, sore throat, neck pain, tinnitus and ear discharge.   Eyes: Negative for blurred vision, double vision, photophobia, pain, discharge and redness.  Respiratory: Negative for cough, hemoptysis, sputum production, shortness of breath, wheezing and stridor.   Cardiovascular: Negative for chest pain, palpitations, orthopnea, claudication, leg swelling and PND.  Gastrointestinal: Positive for abdominal pain. Negative for heartburn, nausea, vomiting, diarrhea, constipation, blood in stool and melena.  Genitourinary: Negative for dysuria, urgency, frequency, hematuria and flank pain.  Musculoskeletal: Negative for myalgias, back pain, joint pain and falls.  Skin: Negative for itching and rash.  Neurological: Negative for dizziness, tingling, tremors, sensory change, speech change, focal weakness, seizures, loss of consciousness, weakness and headaches.  Endo/Heme/Allergies: Negative for environmental allergies and polydipsia. Does not bruise/bleed easily.  Psychiatric/Behavioral: Negative for depression, suicidal ideas, hallucinations, memory loss and substance abuse. The  patient is not nervous/anxious and does not have insomnia.      PHYSICAL EXAM:  Blood pressure 95/69, pulse 69, temperature 97.6 F (36.4 C), temperature source Oral, resp. rate 22, height 5\' 5"  (1.651 m), weight 180 lb (81.647 kg), last menstrual period 08/19/2014, SpO2 98 %.    Vitals reviewed. Constitutional: She is oriented to person, place, and time. She appears well-developed and well-nourished.  HENT:  Head: Normocephalic and atraumatic.  Right Ear: External ear normal.  Left Ear:  External ear normal.  Nose: Nose normal.  Mouth/Throat: Oropharynx is clear and moist.  Eyes: Conjunctivae and EOM are normal. Pupils are equal, round, and reactive to light. Right eye exhibits no discharge. Left eye exhibits no discharge. No scleral icterus.  Neck: Normal range of motion. Neck supple. No tracheal deviation present. No thyromegaly present.  Cardiovascular: Normal rate, regular rhythm, normal heart sounds and intact distal pulses.  Exam reveals no gallop and no friction rub.   No murmur heard. Respiratory: Effort normal and breath sounds normal. No respiratory distress. She has no wheezes. She has no rales. She exhibits no tenderness.  GI: Soft. Bowel sounds are normal. She exhibits no distension and no mass. There is tenderness. There is no rebound and no guarding.  Genitourinary:       Vulva is normal without lesions Vagina is pink moist without discharge Cervix normal in appearance  Uterus is normal size, contour, position, consistency, mobility, non-tender Adnexa is negative with normal sized ovaries by sonogram  Musculoskeletal: Normal range of motion. She exhibits no edema and no tenderness.  Neurological: She is alert and oriented to person, place, and time. She has normal reflexes. She displays normal reflexes. No cranial nerve deficit. She exhibits normal muscle tone. Coordination normal.  Skin: Skin is warm and dry. No rash noted. No erythema. No pallor.  Psychiatric: She has a normal mood and affect. Her behavior is normal. Judgment and thought content normal.    Labs: Results for orders placed or performed during the hospital encounter of 09/09/14 (from the past 336 hour(s))  CBC   Collection Time: 09/09/14  1:30 PM  Result Value Ref Range   WBC 9.8 4.0 - 10.5 K/uL   RBC 4.47 3.87 - 5.11 MIL/uL   Hemoglobin 12.9 12.0 - 15.0 g/dL   HCT 39.4 36.0 - 46.0 %   MCV 88.1 78.0 - 100.0 fL   MCH 28.9 26.0 - 34.0 pg   MCHC 32.7 30.0 - 36.0 g/dL   RDW 15.0 11.5 - 15.5  %   Platelets 412 (H) 150 - 400 K/uL  Comprehensive metabolic panel   Collection Time: 09/09/14  1:30 PM  Result Value Ref Range   Sodium 138 135 - 145 mmol/L   Potassium 4.7 3.5 - 5.1 mmol/L   Chloride 105 96 - 112 mmol/L   CO2 23 19 - 32 mmol/L   Glucose, Bld 78 70 - 99 mg/dL   BUN 15 6 - 23 mg/dL   Creatinine, Ser 1.12 (H) 0.50 - 1.10 mg/dL   Calcium 9.4 8.4 - 10.5 mg/dL   Total Protein 7.5 6.0 - 8.3 g/dL   Albumin 3.6 3.5 - 5.2 g/dL   AST 35 0 - 37 U/L   ALT 46 (H) 0 - 35 U/L   Alkaline Phosphatase 141 (H) 39 - 117 U/L   Total Bilirubin 0.5 0.3 - 1.2 mg/dL   GFR calc non Af Amer 58 (L) >90 mL/min   GFR calc Af Amer 68 (L) >  90 mL/min   Anion gap 10 5 - 15  hCG, quantitative, pregnancy   Collection Time: 09/09/14  1:30 PM  Result Value Ref Range   hCG, Beta Chain, Quant, S <1 <5 mIU/mL  Urinalysis, Routine w reflex microscopic   Collection Time: 09/09/14  1:30 PM  Result Value Ref Range   Color, Urine YELLOW YELLOW   APPearance CLEAR CLEAR   Specific Gravity, Urine 1.025 1.005 - 1.030   pH 5.5 5.0 - 8.0   Glucose, UA NEGATIVE NEGATIVE mg/dL   Hgb urine dipstick NEGATIVE NEGATIVE   Bilirubin Urine NEGATIVE NEGATIVE   Ketones, ur NEGATIVE NEGATIVE mg/dL   Protein, ur NEGATIVE NEGATIVE mg/dL   Urobilinogen, UA 0.2 0.0 - 1.0 mg/dL   Nitrite NEGATIVE NEGATIVE   Leukocytes, UA NEGATIVE NEGATIVE  Type and screen   Collection Time: 09/09/14  1:30 PM  Result Value Ref Range   ABO/RH(D) B POS    Antibody Screen NEG    Sample Expiration 09/23/2014     EKG: No orders found for this or any previous visit.  Imaging Studies: Ct Chest W Contrast  08/19/2014   CLINICAL DATA:  44 year old female with right-sided pleuritic chest pain for the past several months, with left-sided chest pain for the past 3 days. History of cervical cancer.  EXAM: CT CHEST WITH CONTRAST  TECHNIQUE: Multidetector CT imaging of the chest was performed during intravenous contrast administration.   CONTRAST:  12mL OMNIPAQUE IOHEXOL 300 MG/ML  SOLN  COMPARISON:  No priors.  FINDINGS: Mediastinum/Lymph Nodes: Heart size is normal. There is no significant pericardial fluid, thickening or pericardial calcification. No pathologically enlarged mediastinal or hilar lymph nodes. Multiple calcified left hilar lymph nodes. Esophagus is unremarkable in appearance. No axillary lymphadenopathy.  Lungs/Pleura: 6 mm left lower lobe pulmonary nodule (image 32 of series 3). No other suspicious appearing pulmonary nodules or masses are noted. No acute consolidative airspace disease. No pleural effusions.  Upper Abdomen: Status post cholecystectomy.  Musculoskeletal/Soft Tissues: There are no aggressive appearing lytic or blastic lesions noted in the visualized portions of the skeleton.  IMPRESSION: 1. No acute findings in the thorax to account for the patient's symptoms. 2. 6 mm pulmonary nodule in the lateral aspect of the left lower lobe (image 32 of series 3). If the patient is at high risk for bronchogenic carcinoma, follow-up chest CT at 6-12 months is recommended. If the patient is at low risk for bronchogenic carcinoma, follow-up chest CT at 12 months is recommended. This recommendation follows the consensus statement: Guidelines for Management of Small Pulmonary Nodules Detected on CT Scans: A Statement from the [employer] as published in Radiology 2005;237:395-400.   Electronically Signed   By: Vinnie Langton M.D.   On: 08/19/2014 17:30   US Transvaginal Non-ob  08/30/2014   GYNECOLOGIC SONOGRAM   JENIFER STRUVE is a 44 y.o. [id number] LMP 08/19/2014 for a pelvic sonogram  for heavy vag.bleeding for fourteen days.  Uterus                      9.39 x 5.55 x 3.52 cm, anteverted,heterogenous  Endometrium          4.7 mm,  Asymmetrical w  A echogenic nodule adj to  the endometrium ?polyp 4.62mm  Right ovary             2.07 x 1.4 x 1.2 cm, normal  Left ovary  2 x 1.23 x 1.33 cm, normal    Technician  Comments:  Pelvic US: heterogenous anteverted uterus,eec 4.50mm w a echogenic nodule  adj to endometrium 4.38mm ?polyp,normal ov's bilat ,no free fluid     Silver Huguenin 08/30/2014 4:25 PM    US Pelvis Complete  08/30/2014   GYNECOLOGIC SONOGRAM   RAWAN RIENDEAU is a 44 y.o. [id number] LMP 08/19/2014 for a pelvic sonogram  for heavy vag.bleeding for fourteen days.  Uterus                      9.39 x 5.55 x 3.52 cm, anteverted,heterogenous  Endometrium          4.7 mm,  Asymmetrical w  A echogenic nodule adj to  the endometrium ?polyp 4.65mm  Right ovary             2.07 x 1.4 x 1.2 cm, normal  Left ovary                2 x 1.23 x 1.33 cm, normal    Technician Comments:  Pelvic US: heterogenous anteverted uterus,eec 4.32mm w a echogenic nodule  adj to endometrium 4.38mm ?polyp,normal ov's bilat ,no free fluid     Silver Huguenin 08/30/2014 4:25 PM       Assessment: 1.  HSIL with inadequate colposcopy 2.  Dysmenorrhea 3.  Menometrorrhagia 4.  Dyspareunia  Plan: Abdominal hysterectomy with removal of both tubes and ovaries  Ahmari Garton H 09/14/2014 10:09 AM

## 2014-09-14 NOTE — Transfer of Care (Signed)
Immediate Anesthesia Transfer of Care Note  Patient: Jennifer Rollins  Procedure(s) Performed: Procedure(s): HYSTERECTOMY ABDOMINAL (N/A) SALPINGO OOPHORECTOMY (Bilateral)  Patient Location: PACU  Anesthesia Type:General  Level of Consciousness: awake, alert , oriented and patient cooperative  Airway & Oxygen Therapy: Patient Spontanous Breathing  Post-op Assessment: Report given to RN and Post -op Vital signs reviewed and stable  Post vital signs: Reviewed and stable  Last Vitals:  Filed Vitals:   09/14/14 1020  BP: 106/69  Pulse:   Temp:   Resp: 21    Complications: No apparent anesthesia complications

## 2014-09-14 NOTE — Op Note (Signed)
Preoperative diagnosis:  1.  HSIL with inadequate colposcopy                                          2.  Menorrhagia                                         3.  Dysmenorrhea                                         4.  Dyspareunia                                         5.  biLateral pelvic pain, chronic  Postoperative diagnosis:  Same as above   Procedure:  Abdominal hysterectomy with removal of both tubes and ovaries  Surgeon:  Florian Buff  Assistant:    Anesthesia:  General endotracheal  Preoperative clinical summary:  Uterus and tube and ovaries all appeared normal, bladder was adherent to the lower uterine segment  Description of operation:  Patient was taken to the operating room and placed in the supine position where she underwent general endotracheal anesthesia.  She was then prepped and draped in the usual sterile fashion and a Foley catheter was placed for continuous bladder drainage.  A Pfannenstiel skin incision was made and carried down sharply to the rectus fascia which was scored in the midline and extended laterally.  The fascia was taken off the muscles superiorly and inferiorly without difficulty.  The muscles were divided.  The peritoneal cavity was entered.  An medium Alexis self-retaining retractor was placed.  The upper abdomen was packed away. Both uterine cornu were grasped with Coker clamps.  The left round ligament was suture ligated and coagulated with the electrocautery unit.  The left vesicouterine serosal flap was created.  An avascular window in in the peritoneum was created and the infundibulo pelvic ligament was cross clamped, cut and tranfixion suture ligated.  The right round ligament was suture ligated and cut with the electrocautery unit.  The vesicouterine serosal flap on the right was created.  An avascular window in the peritoneum was created and the right infundibulo pelvic ligament was cross clamped, cut and double suture ligated.  Thus both tubes and  ovaries were removed  The uterine vessels were skeletonized bilaterally.  The uterine vessels were clamped bilaterally,  then cut and suture ligated.  Two more pedicles were taken down the cervix medial to the uterine vessels.  Each pedicle was clamped cut and suture ligated with good resulting hemostasis.  The vagina was cross clamped and the uterus and cervix were removed intact.  The vagina was closed with interrupted sutures.   All specimens were sent to pathology for routine evaluation.  The Alexis self-retaining retractor was removed and the pelvis was irrigated vigorously.  All packs were removed and all counts were correct at this point x 3.  The muscles and peritoneum were reapproximated loosely.  The fascia was closed with 0 Vicryl running.  The subcutaneous tissue was reapproximated using 2-0 plain gut.  The skin was closed using 3-0  Vicryl on a Keith needle in a subcuticular fashion.  Liquiban was then applied for additional wound integrity and to serve as a postoperative bacterial barrier.  The patient was awakened from anesthesia taken to the recovery room in good stable condition. All sponge instrument and needle counts were correct x 3.  The patient received Ancef and Toradol prophylactically preoperatively.  Estimated blood loss for the procedure was 100  cc.  Cecilia Nishikawa H 09/14/2014 12:22 PM

## 2014-09-14 NOTE — Anesthesia Procedure Notes (Signed)
Procedure Name: Intubation Date/Time: 09/14/2014 10:38 AM Performed by: Andree Elk, AMY A Pre-anesthesia Checklist: Patient identified, Patient being monitored, Timeout performed, Emergency Drugs available and Suction available Patient Re-evaluated:Patient Re-evaluated prior to inductionOxygen Delivery Method: Circle System Utilized Preoxygenation: Pre-oxygenation with 100% oxygen Intubation Type: IV induction Ventilation: Mask ventilation without difficulty Laryngoscope Size: 3 and Miller Grade View: Grade I Tube type: Oral Tube size: 7.0 mm Number of attempts: 1 Airway Equipment and Method: Stylet Placement Confirmation: ETT inserted through vocal cords under direct vision,  positive ETCO2 and breath sounds checked- equal and bilateral Secured at: 21 cm Tube secured with: Tape Dental Injury: Teeth and Oropharynx as per pre-operative assessment

## 2014-09-14 NOTE — Anesthesia Preprocedure Evaluation (Signed)
Anesthesia Evaluation  Patient identified by MRN, date of birth, ID band Patient awake    Reviewed: Allergy & Precautions, NPO status , Patient's Chart, lab work & pertinent test results  Airway Mallampati: II  TM Distance: >3 FB     Dental  (+) Teeth Intact, Dental Advisory Given   Pulmonary Current Smoker,  breath sounds clear to auscultation        Cardiovascular negative cardio ROS  Rhythm:Regular Rate:Normal     Neuro/Psych PSYCHIATRIC DISORDERS (PTSD) Anxiety Depression  Neuromuscular disease (Hx Sciatica R)    GI/Hepatic negative GI ROS,   Endo/Other    Renal/GU      Musculoskeletal   Abdominal   Peds  Hematology   Anesthesia Other Findings   Reproductive/Obstetrics                             Anesthesia Physical Anesthesia Plan  ASA: II  Anesthesia Plan: General   Post-op Pain Management:    Induction: Intravenous  Airway Management Planned: Oral ETT  Additional Equipment:   Intra-op Plan:   Post-operative Plan: Extubation in OR  Informed Consent: I have reviewed the patients History and Physical, chart, labs and discussed the procedure including the risks, benefits and alternatives for the proposed anesthesia with the patient or authorized representative who has indicated his/her understanding and acceptance.     Plan Discussed with:   Anesthesia Plan Comments:         Anesthesia Quick Evaluation

## 2014-09-15 ENCOUNTER — Encounter (HOSPITAL_COMMUNITY): Payer: Self-pay | Admitting: *Deleted

## 2014-09-15 DIAGNOSIS — Z90710 Acquired absence of both cervix and uterus: Secondary | ICD-10-CM

## 2014-09-15 DIAGNOSIS — Z9079 Acquired absence of other genital organ(s): Secondary | ICD-10-CM

## 2014-09-15 DIAGNOSIS — R87613 High grade squamous intraepithelial lesion on cytologic smear of cervix (HGSIL): Secondary | ICD-10-CM | POA: Diagnosis not present

## 2014-09-15 DIAGNOSIS — Z90722 Acquired absence of ovaries, bilateral: Secondary | ICD-10-CM

## 2014-09-15 LAB — BASIC METABOLIC PANEL
Anion gap: 6 (ref 5–15)
BUN: 13 mg/dL (ref 6–23)
CHLORIDE: 108 mmol/L (ref 96–112)
CO2: 23 mmol/L (ref 19–32)
CREATININE: 0.97 mg/dL (ref 0.50–1.10)
Calcium: 8.7 mg/dL (ref 8.4–10.5)
GFR calc non Af Amer: 69 mL/min — ABNORMAL LOW (ref >90)
GFR, EST AFRICAN AMERICAN: 81 mL/min — AB (ref >90)
Glucose, Bld: 129 mg/dL — ABNORMAL HIGH (ref 70–99)
Potassium: 4.3 mmol/L (ref 3.5–5.1)
Sodium: 137 mmol/L (ref 135–145)

## 2014-09-15 LAB — CBC
HCT: 35.6 % — ABNORMAL LOW (ref 36.0–46.0)
Hemoglobin: 11.5 g/dL — ABNORMAL LOW (ref 12.0–15.0)
MCH: 28.3 pg (ref 26.0–34.0)
MCHC: 32.3 g/dL (ref 30.0–36.0)
MCV: 87.7 fL (ref 78.0–100.0)
PLATELETS: 383 10*3/uL (ref 150–400)
RBC: 4.06 MIL/uL (ref 3.87–5.11)
RDW: 14.6 % (ref 11.5–15.5)
WBC: 11.6 10*3/uL — AB (ref 4.0–10.5)

## 2014-09-15 MED ORDER — ONDANSETRON HCL 8 MG PO TABS: 8.0000 mg | ORAL_TABLET | Freq: Four times a day (QID) | ORAL | Status: DC | PRN

## 2014-09-15 MED ORDER — DIPHENHYDRAMINE HCL 25 MG PO CAPS
50.0000 mg | ORAL_CAPSULE | Freq: Once | ORAL | Status: AC
  Administered 2014-09-15: 50 mg via ORAL
  Filled 2014-09-15: qty 2

## 2014-09-15 MED ORDER — OXYCODONE-ACETAMINOPHEN 7.5-325 MG PO TABS: 1.0000 | ORAL_TABLET | Freq: Four times a day (QID) | ORAL | Status: DC | PRN

## 2014-09-15 NOTE — Progress Notes (Addendum)
Pt called out with complaints of itching all over.  No obvious rash or skin discoloration.  Provided pt with cool rags and paging Dr Elonda Husky to see if obtain order for benadryl.  Will continue to monitor.  Dilaudid given at [place] given at 1041

## 2014-09-15 NOTE — Progress Notes (Signed)
Obtained order for Benadryl 50 mg PO one time dose for pt itching from Dr. Elonda Husky.  Will continue to monitor

## 2014-09-15 NOTE — Anesthesia Postprocedure Evaluation (Signed)
  Anesthesia Post-op Note  Patient: Jennifer Rollins  Procedure(s) Performed: Procedure(s): HYSTERECTOMY ABDOMINAL (N/A) SALPINGO OOPHORECTOMY (Bilateral)  Patient Location: Nursing Unit  Anesthesia Type:General  Level of Consciousness: awake, alert  and oriented  Airway and Oxygen Therapy: Patient Spontanous Breathing  Post-op Pain: none  Post-op Assessment: Post-op Vital signs reviewed, Patient's Cardiovascular Status Stable, Respiratory Function Stable, Patent Airway and No signs of Nausea or vomiting  Post-op Vital Signs: Reviewed and stable  Last Vitals:  Filed Vitals:   09/15/14 0544  BP: 108/68  Pulse: 54  Temp: 37 C  Resp: 18    Complications: No apparent anesthesia complications

## 2014-09-15 NOTE — Discharge Summary (Signed)
  Physician Discharge Summary  Patient ID: Jennifer Rollins MRN: [id number] DOB/AGE: 1970-12-31 44 y.o.  See other disc summary

## 2014-09-15 NOTE — Addendum Note (Signed)
Addendum  created 09/15/14 1020 by Ollen Bowl, CRNA   Modules edited: Notes Section   Notes Section:  File: [id number]

## 2014-09-15 NOTE — Progress Notes (Signed)
UR completed 

## 2014-09-15 NOTE — Discharge Instructions (Signed)
Abdominal Hysterectomy, Care After Refer to this sheet in the next few weeks. These instructions provide you with information on caring for yourself after your procedure. Your health care provider may also give you more specific instructions. Your treatment has been planned according to current medical practices, but problems sometimes occur. Call your health care provider if you have any problems or questions after your procedure.  WHAT TO EXPECT AFTER THE PROCEDURE After your procedure, it is typical to have the following:  Pain.  Feeling tired.  Poor appetite.  Less interest in sex. HOME CARE INSTRUCTIONS  It takes 4-6 weeks to recover from this surgery. Make sure you follow all your health care provider's instructions. Home care instructions may include:  Take pain medicines only as directed by your health care provider. Do not take over-the-counter pain medicines without checking with your health care provider first.  Change your bandage as directed by your health care provider.  Return to your health care provider to have your sutures taken out.  Take showers instead of baths for 2-3 weeks. Ask your health care provider when it is safe to start showering.  Do not douche, use tampons, or have sexual intercourse for at least 6 weeks or until your health care provider says you can.   Follow your health care provider's advice about exercise, lifting, driving, and general activities.  Get plenty of rest and sleep.   Do not lift anything heavier than a gallon of milk (about 10 lb [4.5 kg]) for the first month after surgery.  You can resume your normal diet if your health care provider says it is okay.   Do not drink alcohol until your health care provider says you can.   If you are constipated, ask your health care provider if you can take a mild laxative.  Eating foods high in fiber may also help with constipation. Eat plenty of raw fruits and vegetables, whole grains, and  beans.  Drink enough fluids to keep your urine clear or pale yellow.   Try to have someone at home with you for the first 1-2 weeks to help around the house.  Keep all follow-up appointments. SEEK MEDICAL CARE IF:   You have chills or fever.  You have swelling, redness, or pain in the area of your incision that is getting worse.   You have pus coming from the incision.   You notice a bad smell coming from the incision or bandage.   Your incision breaks open.   You feel dizzy or light-headed.   You have pain or bleeding when you urinate.   You have persistent diarrhea.   You have persistent nausea and vomiting.   You have abnormal vaginal discharge.   You have a rash.   You have any type of abnormal reaction or develop an allergy to your medicine.   Your pain medicine is not helping.  SEEK IMMEDIATE MEDICAL CARE IF:   You have a fever and your symptoms suddenly get worse.  You have severe abdominal pain.  You have chest pain.  You have shortness of breath.  You faint.  You have pain, swelling, or redness of your leg.  You have heavy vaginal bleeding with blood clots. MAKE SURE YOU:  Understand these instructions.  Will watch your condition.  Will get help right away if you are not doing well or get worse. Document Released: 11/30/2004 Document Revised: 05/18/2013 Document Reviewed: 03/05/2013 Exit[employer] Patient Information 2015 [employer]. This information is not intended   to replace advice given to you by your health care provider. Make sure you discuss any questions you have with your health care provider.  

## 2014-09-16 ENCOUNTER — Telehealth: Payer: Self-pay | Admitting: *Deleted

## 2014-09-16 NOTE — Telephone Encounter (Signed)
Pt c/o muscle spasm, no BM. Pt has abdominal Hysterectomy on 04/20 and was discharged from [facility] on 09/15/2014. Informed it was not unusual not to have BM since her surgery on 04/20 to push water, eat foods high in fiber, also to take the Percocet Dr. Elonda Husky prescribed for pain. Informed should see improvement with time. Pt has post op appt with Dr. Elonda Husky 09/21/2014.

## 2014-09-21 ENCOUNTER — Ambulatory Visit (INDEPENDENT_AMBULATORY_CARE_PROVIDER_SITE_OTHER): Payer: BLUE CROSS/BLUE SHIELD | Admitting: Obstetrics & Gynecology

## 2014-09-21 ENCOUNTER — Encounter: Payer: Self-pay | Admitting: Obstetrics & Gynecology

## 2014-09-21 VITALS — BP 112/70 | HR 80 | Wt 188.0 lb

## 2014-09-21 DIAGNOSIS — Z90710 Acquired absence of both cervix and uterus: Secondary | ICD-10-CM

## 2014-09-21 DIAGNOSIS — Z9889 Other specified postprocedural states: Secondary | ICD-10-CM

## 2014-09-21 DIAGNOSIS — Z9079 Acquired absence of other genital organ(s): Principal | ICD-10-CM

## 2014-09-21 DIAGNOSIS — Z90722 Acquired absence of ovaries, bilateral: Principal | ICD-10-CM

## 2014-09-21 MED ORDER — OXYCODONE-ACETAMINOPHEN 7.5-325 MG PO TABS: 1.0000 | ORAL_TABLET | Freq: Four times a day (QID) | ORAL | Status: DC | PRN

## 2014-09-21 NOTE — Addendum Note (Signed)
Addended by: Florian Buff on: 09/21/2014 03:15 PM   Modules accepted: Orders

## 2014-09-21 NOTE — Discharge Summary (Signed)
Physician Discharge Summary  Patient ID: Jennifer Rollins MRN: [id number] DOB/AGE: [birth year] 44 y.o.  Admit date: 09/14/2014 Discharge date: 09/21/2014  Admission Diagnoses: HSIL with inadequate colposcopy, recurrent Chronic pelvic pain Dyspareunia Discharge Diagnoses:  Active Problems:   S/P total hysterectomy and BSO (bilateral salpingo-oophorectomy)   S/P total hysterectomy and bilateral salpingo-oophorectomy   Discharged Condition: good  Hospital Course: unremarkable  Consults: None  Significant Diagnostic Studies: labs:   Treatments: surgery: TAHBSO  Discharge Exam: Blood pressure 108/68, pulse 54, temperature 98.6 F (37 C), temperature source Oral, resp. rate 18, height 5\' 5"  (1.651 m), weight 180 lb (81.647 kg), last menstrual period 08/19/2014, SpO2 99 %. General appearance: alert, cooperative and no distress GI: soft, non-tender; bowel sounds normal; no masses,  no organomegaly Incision/Wound:clean dry intact  Disposition: 01-Home or Self Care  Discharge Instructions    Diet - low sodium heart healthy    Complete by:  As directed      Discharge wound care:    Complete by:  As directed   Keep clean     Driving Restrictions    Complete by:  As directed   None for 1 week     Increase activity slowly    Complete by:  As directed      Lifting restrictions    Complete by:  As directed   No more than 10 pounds     Sexual Activity Restrictions    Complete by:  As directed   Are you kidding?            Medication List    STOP taking these medications        HYDROcodone-acetaminophen 5-325 MG per tablet  Commonly known as:  NORCO/VICODIN     megestrol 40 MG tablet  Commonly known as:  MEGACE      TAKE these medications        ketorolac 10 MG tablet  Commonly known as:  TORADOL  Take 1 tablet (10 mg total) by mouth every 8 (eight) hours as needed.     ondansetron 8 MG tablet  Commonly known as:  ZOFRAN  Take 1 tablet (8 mg total) by mouth  every 6 (six) hours as needed for nausea.     piroxicam 20 MG capsule  Commonly known as:  FELDENE  Take 1 capsule (20 mg total) by mouth daily.     sertraline 100 MG tablet  Commonly known as:  ZOLOFT  Take 150 mg by mouth at bedtime.     traZODone 100 MG tablet  Commonly known as:  DESYREL  Take 100-200 mg by mouth at bedtime.     VITAMIN D PO  Take 1 tablet by mouth once a week. On Monday's  Notes to Patient:  Resume medication as prescribed            Follow-up Information    Follow up with Florian Buff, MD In 1 week.   Specialties:  Obstetrics and Gynecology, Radiology   Why:  post op visit   Contact information:   [place] [address] [phone]       Signed: Florian Buff 09/21/2014, 6:22 PM

## 2014-09-21 NOTE — Progress Notes (Signed)
Patient ID: Jennifer Rollins, female   DOB: 15-Apr-1971, 44 y.o.   MRN: [id number]  HPI: Patient returns for routine postoperative follow-up having undergone abdominal hysterectomy with removal of both tubes and ovaries on 09/14/2014.  The patient's immediate postoperative recovery has been unremarkable. Since hospital discharge the patient reports no problems.   Current Outpatient Prescriptions: Cholecalciferol (VITAMIN D PO), Take 1 tablet by mouth once a week. On Monday's, Disp: , Rfl:  sertraline (ZOLOFT) 100 MG tablet, Take 150 mg by mouth at bedtime. , Disp: , Rfl:  traZODone (DESYREL) 100 MG tablet, Take 100-200 mg by mouth at bedtime. , Disp: , Rfl:  ketorolac (TORADOL) 10 MG tablet, Take 1 tablet (10 mg total) by mouth every 8 (eight) hours as needed. (Patient not taking: Reported on 08/23/2014), Disp: 15 tablet, Rfl: 0 ondansetron (ZOFRAN) 8 MG tablet, Take 1 tablet (8 mg total) by mouth every 6 (six) hours as needed for nausea. (Patient not taking: Reported on 09/21/2014), Disp: 20 tablet, Rfl: 0 oxyCODONE-acetaminophen (PERCOCET) 7.5-325 MG per tablet, Take 1-2 tablets by mouth every 6 (six) hours as needed. (Patient not taking: Reported on 09/21/2014), Disp: 40 tablet, Rfl: 0 piroxicam (FELDENE) 20 MG capsule, Take 1 capsule (20 mg total) by mouth daily. (Patient not taking: Reported on 09/21/2014), Disp: 30 capsule, Rfl: 1  No current facility-administered medications for this visit.    Blood pressure 112/70, pulse 80, weight 188 lb (85.276 kg), last menstrual period 08/19/2014.  Physical Exam: Abdomen soft non tender Incision clean dry intact  Diagnostic Tests: none  Pathology: benign  Impression: S/P abdominal hyst with BSO  Plan:   Follow up: 5  weeks  Florian Buff, MD

## 2014-09-22 ENCOUNTER — Telehealth: Payer: Self-pay | Admitting: Obstetrics & Gynecology

## 2014-09-22 NOTE — Telephone Encounter (Addendum)
Pt c/o runny nose, cough, sweats, no c/o fever. Per Dr. Elonda Husky sweats are "hot flashes." Pt can take OTC claritin or Zyrtec if she feels the cough and runny nose are allergies or robitussin if she feels not allergies. Pt encouraged to call office back if no improvement. Pt saw Dr. Elonda Husky yesterday 09/21/2014 for post op appt.

## 2014-09-26 ENCOUNTER — Telehealth: Payer: Self-pay | Admitting: *Deleted

## 2014-09-26 ENCOUNTER — Telehealth: Payer: Self-pay | Admitting: Obstetrics & Gynecology

## 2014-09-26 MED ORDER — ESTRADIOL 2 MG PO TABS: 2.0000 mg | ORAL_TABLET | Freq: Every day | ORAL | Status: DC

## 2014-09-26 NOTE — Telephone Encounter (Signed)
Pt informed medication sent to pharmacy.

## 2014-09-26 NOTE — Telephone Encounter (Signed)
Pt had a hysterectomy and think she needs a hormone replacement sent to her pharmacy.

## 2014-10-04 ENCOUNTER — Emergency Department (HOSPITAL_COMMUNITY)
Admission: EM | Admit: 2014-10-04 | Discharge: 2014-10-04 | Disposition: A | Discharge status: Home / Self Care | Payer: BLUE CROSS/BLUE SHIELD | Attending: Emergency Medicine | Admitting: Emergency Medicine

## 2014-10-04 ENCOUNTER — Encounter (HOSPITAL_COMMUNITY): Payer: Self-pay

## 2014-10-04 DIAGNOSIS — Z87448 Personal history of other diseases of urinary system: Secondary | ICD-10-CM | POA: Diagnosis not present

## 2014-10-04 DIAGNOSIS — R300 Dysuria: Secondary | ICD-10-CM | POA: Insufficient documentation

## 2014-10-04 DIAGNOSIS — Z9049 Acquired absence of other specified parts of digestive tract: Secondary | ICD-10-CM | POA: Diagnosis not present

## 2014-10-04 DIAGNOSIS — J040 Acute laryngitis: Secondary | ICD-10-CM | POA: Diagnosis not present

## 2014-10-04 DIAGNOSIS — Z91040 Latex allergy status: Secondary | ICD-10-CM | POA: Insufficient documentation

## 2014-10-04 DIAGNOSIS — Z720 Tobacco use: Secondary | ICD-10-CM | POA: Insufficient documentation

## 2014-10-04 DIAGNOSIS — F419 Anxiety disorder, unspecified: Secondary | ICD-10-CM | POA: Insufficient documentation

## 2014-10-04 DIAGNOSIS — M549 Dorsalgia, unspecified: Secondary | ICD-10-CM | POA: Insufficient documentation

## 2014-10-04 DIAGNOSIS — R350 Frequency of micturition: Secondary | ICD-10-CM | POA: Diagnosis not present

## 2014-10-04 DIAGNOSIS — Z79899 Other long term (current) drug therapy: Secondary | ICD-10-CM | POA: Diagnosis not present

## 2014-10-04 DIAGNOSIS — F4310 Post-traumatic stress disorder, unspecified: Secondary | ICD-10-CM | POA: Diagnosis not present

## 2014-10-04 DIAGNOSIS — Z9851 Tubal ligation status: Secondary | ICD-10-CM | POA: Diagnosis not present

## 2014-10-04 DIAGNOSIS — Z90710 Acquired absence of both cervix and uterus: Secondary | ICD-10-CM | POA: Diagnosis not present

## 2014-10-04 DIAGNOSIS — Z862 Personal history of diseases of the blood and blood-forming organs and certain disorders involving the immune mechanism: Secondary | ICD-10-CM | POA: Insufficient documentation

## 2014-10-04 DIAGNOSIS — Z8739 Personal history of other diseases of the musculoskeletal system and connective tissue: Secondary | ICD-10-CM | POA: Insufficient documentation

## 2014-10-04 LAB — CBC WITH DIFFERENTIAL/PLATELET
Basophils Absolute: 0 10*3/uL (ref 0.0–0.1)
Basophils Relative: 0 % (ref 0–1)
EOS ABS: 0.1 10*3/uL (ref 0.0–0.7)
EOS PCT: 1 % (ref 0–5)
HEMATOCRIT: 37.4 % (ref 36.0–46.0)
Hemoglobin: 12.2 g/dL (ref 12.0–15.0)
Lymphocytes Relative: 20 % (ref 12–46)
Lymphs Abs: 1.9 10*3/uL (ref 0.7–4.0)
MCH: 28.6 pg (ref 26.0–34.0)
MCHC: 32.6 g/dL (ref 30.0–36.0)
MCV: 87.6 fL (ref 78.0–100.0)
MONO ABS: 0.7 10*3/uL (ref 0.1–1.0)
MONOS PCT: 7 % (ref 3–12)
NEUTROS PCT: 72 % (ref 43–77)
Neutro Abs: 6.7 10*3/uL (ref 1.7–7.7)
Platelets: 355 10*3/uL (ref 150–400)
RBC: 4.27 MIL/uL (ref 3.87–5.11)
RDW: 15 % (ref 11.5–15.5)
WBC: 9.5 10*3/uL (ref 4.0–10.5)

## 2014-10-04 LAB — URINALYSIS, ROUTINE W REFLEX MICROSCOPIC
Bilirubin Urine: NEGATIVE
Glucose, UA: NEGATIVE mg/dL
Hgb urine dipstick: NEGATIVE
Ketones, ur: NEGATIVE mg/dL
Leukocytes, UA: NEGATIVE
Nitrite: NEGATIVE
PH: 6 (ref 5.0–8.0)
Protein, ur: NEGATIVE mg/dL
SPECIFIC GRAVITY, URINE: 1.025 (ref 1.005–1.030)
Urobilinogen, UA: 0.2 mg/dL (ref 0.0–1.0)

## 2014-10-04 LAB — SEDIMENTATION RATE: SED RATE: 58 mm/h — AB (ref 0–22)

## 2014-10-04 MED ORDER — HYDROCODONE-ACETAMINOPHEN 5-325 MG PO TABS: 1.0000 | ORAL_TABLET | ORAL | Status: DC | PRN

## 2014-10-04 NOTE — ED Notes (Signed)
Reports hoariness for 3 days along with uti symptoms.

## 2014-10-04 NOTE — Discharge Instructions (Signed)
Dysuria Dysuria is the medical term for pain with urination. There are many causes for dysuria, but urinary tract infection is the most common. If a urinalysis was performed it can show that there is a urinary tract infection. A urine culture confirms that you or your child is sick. You will need to follow up with a healthcare provider because:  If a urine culture was done you will need to know the culture results and treatment recommendations.  If the urine culture was positive, you or your child will need to be put on antibiotics or know if the antibiotics prescribed are the right antibiotics for your urinary tract infection.  If the urine culture is negative (no urinary tract infection), then other causes may need to be explored or antibiotics need to be stopped. Today laboratory work may have been done and there does not seem to be an infection. If cultures were done they will take at least 24 to 48 hours to be completed. Today x-rays may have been taken and they read as normal. No cause can be found for the problems. The x-rays may be re-read by a radiologist and you will be contacted if additional findings are made. You or your child may have been put on medications to help with this problem until you can see your primary caregiver. If the problems get better, see your primary caregiver if the problems return. If you were given antibiotics (medications which kill germs), take all of the mediations as directed for the full course of treatment.  If laboratory work was done, you need to find the results. Leave a telephone number where you can be reached. If this is not possible, make sure you find out how you are to get test results. HOME CARE INSTRUCTIONS   Drink lots of fluids. For adults, drink eight, 8 ounce glasses of clear juice or water a day. For children, replace fluids as suggested by your caregiver.  Empty the bladder often. Avoid holding urine for long periods of time.  After a bowel  movement, women should cleanse front to back, using each tissue only once.  Empty your bladder before and after sexual intercourse.  Take all the medicine given to you until it is gone. You may feel better in a few days, but TAKE ALL MEDICINE.  Avoid caffeine, tea, alcohol and carbonated beverages, because they tend to irritate the bladder.  In men, alcohol may irritate the prostate.  Only take over-the-counter or prescription medicines for pain, discomfort, or fever as directed by your caregiver.  If your caregiver has given you a follow-up appointment, it is very important to keep that appointment. Not keeping the appointment could result in a chronic or permanent injury, pain, and disability. If there is any problem keeping the appointment, you must call back to this facility for assistance. SEEK IMMEDIATE MEDICAL CARE IF:   Back pain develops.  A fever develops.  There is nausea (feeling sick to your stomach) or vomiting (throwing up).  Problems are no better with medications or are getting worse. MAKE SURE YOU:   Understand these instructions.  Will watch your condition.  Will get help right away if you are not doing well or get worse. Document Released: 02/09/2004 Document Revised: 08/05/2011 Document Reviewed: 12/17/2007 [facility] Patient Information 2015 [place], [place]. This information is not intended to replace advice given to you by your health care provider. Make sure you discuss any questions you have with your health care provider.  Laryngitis Laryngitis is  redness, soreness, and puffiness (inflammation) of the vocal cords. It causes hoarseness, cough, loss of voice, sore throat, and dry throat. It may be caused by:  Infection.  Too much smoking.  Too much talking or yelling.  Breathing in of toxic fumes.  Allergies.  A backup of acid from your stomach. HOME CARE  Drink enough fluids to keep your pee (urine) clear or pale yellow.  Rest until you no  longer have problems or as told by your doctor.  Breathe in moist air.  Take all medicine as told by your doctor.  Do not smoke.  Talk as little as possible (this includes whispering).  Write on paper instead of talking until your voice is back to normal.  Follow up with your doctor if you have not improved after 10 days. GET HELP IF:   You have trouble breathing.  You cough up blood.  You have a fever that will not go away.  You have increasing pain.  You have trouble swallowing. MAKE SURE YOU:  Understand these instructions.  Will watch your condition.  Will get help right away if you are not doing well or get worse. Document Released: 05/02/2011 Document Revised: 08/05/2011 Document Reviewed: 05/02/2011 [facility] Patient Information 2015 [place], [place]. This information is not intended to replace advice given to you by your health care provider. Make sure you discuss any questions you have with your health care provider.   You may take the hydrocodone prescribed for pain relief.  This will make you drowsy - do not drive within 4 hours of taking this medication.

## 2014-10-05 ENCOUNTER — Encounter: Payer: BLUE CROSS/BLUE SHIELD | Admitting: Obstetrics and Gynecology

## 2014-10-05 NOTE — ED Provider Notes (Signed)
CSN: [id number]     Arrival date & time 10/04/14  1351 History   First MD Initiated Contact with Patient 10/04/14 1455     Chief Complaint  Patient presents with  . Urinary Tract Infection     (Consider location/radiation/quality/duration/timing/severity/associated sxs/prior Treatment) The history is provided by the patient.   Jennifer Rollins is a 44 y.o. female who is currently 3 weeks post surgical from an abdominal hysterectomy followed by Dr Elonda Husky, presenting with a 3 day history of hoarseness of voice and also increased urinary frequency with low pelvis pressure sensation.  She denies hematuria, fevers, chills, low back pain, nausea or vomiting, abdominal distention.  She has had a nonproductive cough, possibly causing her voice changes.  She denies other uri sx and denies sob.  She was taking hydrocodone for post surgical pain, ran out this past week.       Past Medical History  Diagnosis Date  . Sciatica   . Sciatica of right side   . PTSD (post-traumatic stress disorder)   . Depression   . Abnormal Pap smear of cervix   . HPV in female   . Anxiety   . Anemia    Past Surgical History  Procedure Laterality Date  . Cesarean section    . Cholecystectomy    . Tubal ligation    . Colposcopy vulva w/ biopsy    . Abdominal hysterectomy N/A 09/14/2014    Procedure: HYSTERECTOMY ABDOMINAL;  Surgeon: Florian Buff, MD;  Location: [facility];  Service: Gynecology;  Laterality: N/A;  . Salpingoophorectomy Bilateral 09/14/2014    Procedure: SALPINGO OOPHORECTOMY;  Surgeon: Florian Buff, MD;  Location: [facility];  Service: Gynecology;  Laterality: Bilateral;   Family History  Problem Relation Age of Onset  . Diabetes Mother   . Heart attack Mother   . COPD Mother    History  Substance Use Topics  . Smoking status: Current Some Day Smoker -- 1.00 packs/day for 30 years    Types: Cigarettes  . Smokeless tobacco: Never Used  . Alcohol Use: No   OB History    Gravida Para Term  Preterm AB TAB SAB Ectopic Multiple Living   4 2 2  2     2      Review of Systems  Constitutional: Negative for fever and chills.  HENT: Negative for congestion and sore throat.   Eyes: Negative.   Respiratory: Negative for chest tightness and shortness of breath.   Cardiovascular: Negative for chest pain.  Gastrointestinal: Negative for nausea and abdominal pain.  Genitourinary: Positive for dysuria and frequency. Negative for urgency, hematuria, vaginal bleeding, vaginal discharge, difficulty urinating and pelvic pain.  Musculoskeletal: Positive for back pain. Negative for joint swelling, arthralgias and neck pain.  Skin: Negative.  Negative for rash and wound.  Neurological: Negative for dizziness, weakness, light-headedness, numbness and headaches.  Psychiatric/Behavioral: Negative.       Allergies  Latex  Home Medications   Prior to Admission medications   Medication Sig Start Date End Date Taking? Authorizing Provider  Cholecalciferol (VITAMIN D PO) Take 1 tablet by mouth once a week. On Monday's   Yes Historical Provider, MD  estradiol (ESTRACE) 2 MG tablet Take 1 tablet (2 mg total) by mouth daily. 09/26/14  Yes Florian Buff, MD  sertraline (ZOLOFT) 100 MG tablet Take 150 mg by mouth at bedtime.    Yes Historical Provider, MD  traZODone (DESYREL) 100 MG tablet Take 100-200 mg by mouth at bedtime.  Yes Historical Provider, MD  HYDROcodone-acetaminophen (NORCO/VICODIN) 5-325 MG per tablet Take 1 tablet by mouth every 4 (four) hours as needed. 10/04/14   Evalee Jefferson, PA-C  ketorolac (TORADOL) 10 MG tablet Take 1 tablet (10 mg total) by mouth every 8 (eight) hours as needed. Patient not taking: Reported on 08/23/2014 08/01/14   Florian Buff, MD  ondansetron (ZOFRAN) 8 MG tablet Take 1 tablet (8 mg total) by mouth every 6 (six) hours as needed for nausea. Patient not taking: Reported on 09/21/2014 09/15/14   Florian Buff, MD  oxyCODONE-acetaminophen (PERCOCET) 7.5-325 MG per tablet  Take 1-2 tablets by mouth every 6 (six) hours as needed. Patient not taking: Reported on 10/04/2014 09/21/14   Florian Buff, MD  piroxicam (FELDENE) 20 MG capsule Take 1 capsule (20 mg total) by mouth daily. Patient not taking: Reported on 09/21/2014 08/30/14   Florian Buff, MD   BP 129/79 mmHg  Pulse 77  Temp(Src) 98.7 F (37.1 C) (Oral)  Resp 20  Ht 5\' 5"  (1.651 m)  Wt 180 lb (81.647 kg)  BMI 29.95 kg/m2  SpO2 97%  LMP 08/19/2014 Physical Exam  Constitutional: She appears well-developed and well-nourished.  HENT:  Head: Normocephalic and atraumatic.  Mouth/Throat: Oropharynx is clear and moist. No oropharyngeal exudate.  Eyes: Conjunctivae are normal.  Neck: Normal range of motion.  Cardiovascular: Normal rate, regular rhythm, normal heart sounds and intact distal pulses.   Pulmonary/Chest: Effort normal and breath sounds normal. No stridor. No respiratory distress. She has no wheezes. She has no rales.  Abdominal: Soft. Bowel sounds are normal. She exhibits no distension. There is no tenderness. There is no guarding.  She is mildly ttp along incision lower pelvis.  Well healing surgical incision.  No masses, no edema or erythema.    Musculoskeletal: Normal range of motion.  Lymphadenopathy:    She has no cervical adenopathy.  Neurological: She is alert.  Skin: Skin is warm and dry.  Psychiatric: She has a normal mood and affect.  Nursing note and vitals reviewed.   ED Course  Procedures (including critical care time) Labs Review Labs Reviewed  SEDIMENTATION RATE - Abnormal; Notable for the following:    Sed Rate 58 (*)    All other components within normal limits  URINALYSIS, ROUTINE W REFLEX MICROSCOPIC  CBC WITH DIFFERENTIAL/PLATELET    Imaging Review No results found.   EKG Interpretation None      MDM   Final diagnoses:  Dysuria  Laryngitis, acute    Patients labs and/or radiological studies were reviewed and considered during the medical decision  making and disposition process.  Results were also discussed with patient. Pt discussed with Dr. Glo Herring who has requested labs including cbc and sed rate which was drawn prior to her dispo home.  He will plan to see her in his office tomorrow 9 am.  Pt was given this information and agrees with plan.  She was given prescription for hydrocodone prn pain relief.    The patient appears reasonably screened and/or stabilized for discharge and I doubt any other medical condition or other [facility] requiring further screening, evaluation, or treatment in the ED at this time prior to discharge.     Evalee Jefferson, PA-C 10/05/14 2241  Virgel Manifold, MD 10/06/14 [phone]

## 2014-10-06 ENCOUNTER — Telehealth: Payer: Self-pay | Admitting: Obstetrics & Gynecology

## 2014-10-10 ENCOUNTER — Ambulatory Visit: Payer: BLUE CROSS/BLUE SHIELD | Admitting: Obstetrics & Gynecology

## 2014-10-10 NOTE — Telephone Encounter (Signed)
I called pt and informed her sed rates are physiologically elevated post operatively, she understands

## 2014-10-11 DIAGNOSIS — Z029 Encounter for administrative examinations, unspecified: Secondary | ICD-10-CM

## 2014-10-25 ENCOUNTER — Encounter: Payer: BLUE CROSS/BLUE SHIELD | Admitting: Obstetrics & Gynecology

## 2014-10-25 ENCOUNTER — Encounter: Payer: Self-pay | Admitting: Obstetrics & Gynecology

## 2014-12-08 ENCOUNTER — Telehealth: Payer: Self-pay | Admitting: *Deleted

## 2014-12-08 NOTE — Telephone Encounter (Signed)
Opened in error

## 2014-12-30 ENCOUNTER — Ambulatory Visit: Payer: BLUE CROSS/BLUE SHIELD | Admitting: Obstetrics and Gynecology

## 2015-01-15 ENCOUNTER — Emergency Department (HOSPITAL_COMMUNITY)
Admission: EM | Admit: 2015-01-15 | Discharge: 2015-01-15 | Disposition: A | Discharge status: Home / Self Care | Payer: 59 | Attending: Emergency Medicine | Admitting: Emergency Medicine

## 2015-01-15 ENCOUNTER — Encounter (HOSPITAL_COMMUNITY): Payer: Self-pay | Admitting: Emergency Medicine

## 2015-01-15 DIAGNOSIS — T7840XA Allergy, unspecified, initial encounter: Secondary | ICD-10-CM

## 2015-01-15 DIAGNOSIS — Z79899 Other long term (current) drug therapy: Secondary | ICD-10-CM | POA: Diagnosis not present

## 2015-01-15 DIAGNOSIS — L299 Pruritus, unspecified: Secondary | ICD-10-CM | POA: Diagnosis present

## 2015-01-15 DIAGNOSIS — Z862 Personal history of diseases of the blood and blood-forming organs and certain disorders involving the immune mechanism: Secondary | ICD-10-CM | POA: Insufficient documentation

## 2015-01-15 DIAGNOSIS — G8929 Other chronic pain: Secondary | ICD-10-CM | POA: Insufficient documentation

## 2015-01-15 DIAGNOSIS — Z91040 Latex allergy status: Secondary | ICD-10-CM | POA: Diagnosis not present

## 2015-01-15 DIAGNOSIS — Z8739 Personal history of other diseases of the musculoskeletal system and connective tissue: Secondary | ICD-10-CM | POA: Insufficient documentation

## 2015-01-15 DIAGNOSIS — F419 Anxiety disorder, unspecified: Secondary | ICD-10-CM | POA: Insufficient documentation

## 2015-01-15 DIAGNOSIS — Z720 Tobacco use: Secondary | ICD-10-CM | POA: Insufficient documentation

## 2015-01-15 DIAGNOSIS — Z8619 Personal history of other infectious and parasitic diseases: Secondary | ICD-10-CM | POA: Diagnosis not present

## 2015-01-15 DIAGNOSIS — F329 Major depressive disorder, single episode, unspecified: Secondary | ICD-10-CM | POA: Insufficient documentation

## 2015-01-15 DIAGNOSIS — T490X5A Adverse effect of local antifungal, anti-infective and anti-inflammatory drugs, initial encounter: Secondary | ICD-10-CM | POA: Diagnosis not present

## 2015-01-15 HISTORY — DX: Other chronic pain: G89.29

## 2015-01-15 HISTORY — DX: Dorsalgia, unspecified: M54.9

## 2015-01-15 HISTORY — DX: Pelvic and perineal pain: R10.2

## 2015-01-15 MED ORDER — PREDNISONE 20 MG PO TABS: 40.0000 mg | ORAL_TABLET | Freq: Every day | ORAL | Status: DC

## 2015-01-15 MED ORDER — FAMOTIDINE 20 MG PO TABS
40.0000 mg | ORAL_TABLET | Freq: Once | ORAL | Status: AC
  Administered 2015-01-15: 40 mg via ORAL
  Filled 2015-01-15: qty 2

## 2015-01-15 MED ORDER — PREDNISONE 10 MG PO TABS
60.0000 mg | ORAL_TABLET | Freq: Once | ORAL | Status: AC
Start: 2015-01-15 — End: 2015-01-15
  Administered 2015-01-15: 60 mg via ORAL
  Filled 2015-01-15 (×2): qty 1

## 2015-01-15 MED ORDER — DIPHENHYDRAMINE HCL 25 MG PO CAPS
50.0000 mg | ORAL_CAPSULE | Freq: Once | ORAL | Status: AC
  Administered 2015-01-15: 50 mg via ORAL
  Filled 2015-01-15: qty 2

## 2015-01-15 NOTE — ED Provider Notes (Signed)
CSN: [id number]     Arrival date & time 01/15/15  0732 History   First MD Initiated Contact with Patient 01/15/15 0745     Chief Complaint  Patient presents with  . Allergic Reaction      HPI Pt was seen at [facility].  Per pt, c/o gradual onset and persistence of constant "itching" that began yesterday. Pt states she began to take flagyl for the first time 2 days ago. Pt cannot recall any other new meds, foods, etc, during this time. Pt has not taken any meds to treat her symptoms. Denies rash, no SOB/cough, no dysphagia, no fevers.    Past Medical History  Diagnosis Date  . Sciatica of right side   . PTSD (post-traumatic stress disorder)   . Depression   . Abnormal Pap smear of cervix   . HPV in female   . Anxiety   . Anemia   . Chronic pelvic pain in female   . Chronic back pain    Past Surgical History  Procedure Laterality Date  . Cesarean section    . Cholecystectomy    . Tubal ligation    . Colposcopy vulva w/ biopsy    . Abdominal hysterectomy N/A 09/14/2014    Procedure: HYSTERECTOMY ABDOMINAL;  Surgeon: Florian Buff, MD;  Location: [facility];  Service: Gynecology;  Laterality: N/A;  . Salpingoophorectomy Bilateral 09/14/2014    Procedure: SALPINGO OOPHORECTOMY;  Surgeon: Florian Buff, MD;  Location: [facility];  Service: Gynecology;  Laterality: Bilateral;   Family History  Problem Relation Age of Onset  . Diabetes Mother   . Heart attack Mother   . COPD Mother    Social History  Substance Use Topics  . Smoking status: Current Some Day Smoker -- 1.00 packs/day for 30 years    Types: Cigarettes  . Smokeless tobacco: Never Used  . Alcohol Use: No   OB History    Gravida Para Term Preterm AB TAB SAB Ectopic Multiple Living   4 2 2  2     2      Review of Systems ROS: Statement: All systems negative except as marked or noted in the HPI; Constitutional: Negative for fever and chills. ; ; Eyes: Negative for eye pain, redness and discharge. ; ; ENMT: Negative for ear pain,  hoarseness, nasal congestion, sinus pressure and sore throat. ; ; Cardiovascular: Negative for chest pain, palpitations, diaphoresis, dyspnea and peripheral edema. ; ; Respiratory: Negative for cough, wheezing and stridor. ; ; Gastrointestinal: Negative for nausea, vomiting, diarrhea, abdominal pain, blood in stool, hematemesis, jaundice and rectal bleeding. . ; ; Genitourinary: Negative for dysuria, flank pain and hematuria. ; ; Musculoskeletal: Negative for back pain and neck pain. Negative for swelling and trauma.; ; Skin: +itching. Negative for rash, abrasions, blisters, bruising and skin lesion.; ; Neuro: Negative for headache, lightheadedness and neck stiffness. Negative for weakness, altered level of consciousness , altered mental status, extremity weakness, paresthesias, involuntary movement, seizure and syncope.      Allergies  Flagyl and Latex  Home Medications   Prior to Admission medications   Medication Sig Start Date End Date Taking? Authorizing Provider  Cholecalciferol (VITAMIN D PO) Take 1 tablet by mouth once a week. On Monday's    Historical Provider, MD  estradiol (ESTRACE) 2 MG tablet Take 1 tablet (2 mg total) by mouth daily. 09/26/14   Florian Buff, MD  HYDROcodone-acetaminophen (NORCO/VICODIN) 5-325 MG per tablet Take 1 tablet by mouth every 4 (four) hours as  needed. 10/04/14   Evalee Jefferson, PA-C  ketorolac (TORADOL) 10 MG tablet Take 1 tablet (10 mg total) by mouth every 8 (eight) hours as needed. Patient not taking: Reported on 08/23/2014 08/01/14   Florian Buff, MD  ondansetron (ZOFRAN) 8 MG tablet Take 1 tablet (8 mg total) by mouth every 6 (six) hours as needed for nausea. Patient not taking: Reported on 09/21/2014 09/15/14   Florian Buff, MD  oxyCODONE-acetaminophen (PERCOCET) 7.5-325 MG per tablet Take 1-2 tablets by mouth every 6 (six) hours as needed. Patient not taking: Reported on 10/04/2014 09/21/14   Florian Buff, MD  piroxicam (FELDENE) 20 MG capsule Take 1 capsule  (20 mg total) by mouth daily. Patient not taking: Reported on 09/21/2014 08/30/14   Florian Buff, MD  sertraline (ZOLOFT) 100 MG tablet Take 150 mg by mouth at bedtime.     Historical Provider, MD  traZODone (DESYREL) 100 MG tablet Take 100-200 mg by mouth at bedtime.     Historical Provider, MD   BP 112/85 mmHg  Pulse 69  Temp(Src) 97.7 F (36.5 C) (Oral)  Resp 18  Ht 5\' 5"  (1.651 m)  Wt 190 lb (86.183 kg)  BMI 31.62 kg/m2  SpO2 95%  LMP 08/19/2014 Physical Exam 0750: Physical examination:  Nursing notes reviewed; Vital signs and O2 SAT reviewed;  Constitutional: Well developed, Well nourished, Well hydrated, In no acute distress; Head:  Normocephalic, atraumatic; Eyes: EOMI, PERRL, No scleral icterus; ENMT: Mouth and pharynx normal, Mucous membranes moist. Mouth and pharynx without lesions. No tonsillar exudates. No intra-oral edema. No submandibular or sublingual edema. No hoarse voice, no drooling, no stridor. No trismus.; Neck: Supple, Full range of motion, No lymphadenopathy; Cardiovascular: Regular rate and rhythm, No murmur, rub, or gallop; Respiratory: Breath sounds clear & equal bilaterally, No rales, rhonchi, wheezes.  Speaking full sentences with ease, Normal respiratory effort/excursion; Chest: Nontender, Movement normal; Abdomen: Soft, Nontender, Nondistended, Normal bowel sounds; Genitourinary: No CVA tenderness; Extremities: Pulses normal, No tenderness, No edema, No calf edema or asymmetry.; Neuro: AA&Ox3, Major CN grossly intact.  Speech clear. No gross focal motor or sensory deficits in extremities.; Skin: Color normal, Warm, Dry. +scratching her arms and legs during exam. No rash to face, torso, arms or legs.     ED Course  Procedures   MDM  MDM Reviewed: previous chart, nursing note and vitals     0755:  Tx symptomatically for allergic rxn. Stop flagyl. F/U PMD. Dx d/w pt and family.  Questions answered.  Verb understanding, agreeable to d/c home with outpt  f/u.   Francine Graven, DO 01/17/15 1547

## 2015-01-15 NOTE — Discharge Instructions (Signed)
°Emergency Department Resource Guide °1) Find a Doctor and Pay Out of Pocket °Although you won't have to find out who is covered by your insurance plan, it is a good idea to ask around and get recommendations. You will then need to call the office and see if the doctor you have chosen will accept you as a new patient and what types of options they offer for patients who are self-pay. Some doctors offer discounts or will set up payment plans for their patients who do not have insurance, but you will need to ask so you aren't surprised when you get to your appointment. ° °2) Contact Your Local Health Department °Not all health departments have doctors that can see patients for sick visits, but many do, so it is worth a call to see if yours does. If you don't know where your local health department is, you can check in your phone book. The CDC also has a tool to help you locate your state's health department, and many state websites also have listings of all of their local health departments. ° °3) Find a Walk-in Clinic °If your illness is not likely to be very severe or complicated, you may want to try a walk in clinic. These are popping up all over the country in pharmacies, drugstores, and shopping centers. They're usually staffed by nurse practitioners or physician assistants that have been trained to treat common illnesses and complaints. They're usually fairly quick and inexpensive. However, if you have serious medical issues or chronic medical problems, these are probably not your best option. ° °No Primary Care Doctor: °- Call [facility] at  832-8000 - they can help you locate a primary care doctor that  accepts your insurance, provides certain services, etc. °- Physician Referral Service- 1-800-533-3463 ° °Chronic Pain Problems: °Organization         Address  Phone   Notes  °[facility]  (336) 297-2271 Patients need to be referred by their primary care doctor.  ° °Medication  Assistance: °Organization         Address  Phone   Notes  °[place] [facility] [address] °[place], [place] [address] (336) 641-8030 --Must be a resident of [place] °-- Must have NO insurance coverage whatsoever (no Medicaid/ Medicare, etc.) °-- The pt. MUST have a primary care doctor that directs their care regularly and follows them in the community °  °[facility]  (866) 331-1348   °[employer]  (888) 892-1162   ° °Agencies that provide inexpensive medical care: °Organization         Address  Phone   Notes  °[facility]  (336) 832-8035   °[facility]    (336) 832-7272   °[facility] [address] °[place], [place] [address] (336) 832-4777   °[facility] [address], °[place] (336) 271-4999   °[facility]    (336) 373-0678   °[facility]    (336) 272-1050   °[facility] ° [address], [place] Phone:  (336) 832-4444, Fax:  (336) 832-4440 Hours of Operation:  9 am - 6 pm, M-F.  Also accepts Medicaid/Medicare and self-pay.  °[facility] ° [address], [place] Phone: (336) [phone], Fax: (336) [phone]. Hours of Operation:  8:30 am - 5:30 pm, M-F.  Also accepts Medicaid and self-pay.  °[facility] [place] [address], [place] Phone: (336) 878-6027   °[facility] [address], [place], [place] (336)723-1848, Ext. 123 Mondays & Thursdays: 7-9 AM.  First 15 patients are seen on a first come, first serve basis. °  ° °Medicaid-accepting [place] Providers: ° °Organization         Address  Phone   Notes  °[facility] [address], [place] (336) 641-2100 Also accepts self-pay patients.  °[facility] [address], [place] ° (336) 856-9996   °[facility] [address], [place]  (336) 288-8857   °[facility] [address] [place] [address], [place] (336) 299-7000   °Veita Bland [address], [place]  ° (336) 373-1557 Only accepts [place] Access Medicaid patients after they have their name applied to their card.  ° °Self-Pay (no insurance) in [place]: ° °Organization         Address  Phone   Notes  °Sickle Cell Patients, [facility] [address], [place] (336) 832-1970   °[facility] [address], [place] (336) 832-4400   ° [facility] ° [address], [place] (336) 992-4800   °[facility]/Dr. Osei-Bonsu ° [address] [place] [address], [place] or [address], [place] (336) [phone] Phone number for both [place] and [place] locations is the same.  °[facility] [address], [place] (336) 299-0000   °[facility] [address] [place] [address], [place] or [address] (336) 852-7530 °(336) 878-2260   °[facility] [address], [place] (336) 350-1642, phone; (336) 294-5005, fax Sees patients 1st and 3rd Saturday of every month.  Must not qualify for public or private insurance (i.e. Medicaid, Medicare, [place] Health Choice, Veterans' Benefits) • Household income should be no more than 200% of the poverty level •The clinic cannot treat you if you are pregnant or think you are pregnant • Sexually transmitted diseases are not treated at the clinic.  ° ° °Dental Care: °Organization         Address  Phone  Notes  °[place] [facility] [address], [place] (336) 641-6152 Accepts children up to age 21 who are enrolled in Medicaid or [place] Health Choice; pregnant women with a Medicaid card; and children who have applied for Medicaid or [place] Health Choice, but were declined, whose parents can pay a reduced fee at time of service.  °[place]  [facility] [place]  [address], [place] (336) 641-7733 Accepts children up to age 21 who are enrolled in Medicaid or [place] Health Choice; pregnant women with a Medicaid card; and children who have applied for Medicaid or [place] Health Choice, but were declined, whose parents can pay a reduced fee at time of service.  °[facility] ° [address], [place] (336) 641-4533 Patients are seen by appointment only. Walk-ins are not accepted. [facility] will see patients 18 years of age and older. °Monday - Tuesday (8am-5pm) °Most Wednesdays (8:30-5pm) °$30 per visit, cash only  °[facility] ° [address], [place] (336) 641-4533 Patients are seen by appointment only. Walk-ins are not accepted. [facility] will see patients 18 years of age and older. °One   Wednesday Evening (Monthly: Volunteer Based).  $30 per visit, cash only  °[facility]  (919) 537-3737 for adults; Children under age 4, call Graduate Pediatric Dentistry at (919) [phone]. Children aged 4-14, please call (919) [phone] to request a pediatric application. ° Dental services are provided in all areas of dental care including fillings, crowns and bridges, complete and partial dentures, implants, gum treatment, root canals, and extractions. Preventive care is also provided. Treatment is provided to both adults and children. °Patients are selected via a lottery and there is often a waiting list. °  °[facility] [address], °[place] ° (336) 763-8833 www.drcivils.com °  °[facility] [address], [place], [place] (336)723-1848, Ext. 123 Second and Fourth Thursday of each month, opens at 6:30 AM; Clinic ends at 9 AM.  Patients are seen on a first-come first-served basis, and a limited number are seen during each clinic.  ° °[facility] ° [address], [place], [place] (336) 723-7904    Eligibility Requirements °You must have lived in [place], or [place] counties for at least the last three months. °  You cannot be eligible for state or federal sponsored healthcare insurance, including Veterans Administration, Medicaid, or Medicare. °  You generally cannot be eligible for healthcare insurance through your employer.  °  How to apply: °Eligibility screenings are held every Tuesday and Wednesday afternoon from 1:00 pm until 4:00 pm. You do not need an appointment for the interview!  °[facility] [address], [place], [place] 336-631-2330   °[facility]  336-342-8273   °[facility]  336-703-3100   °[facility]  336-570-6415   ° °Behavioral Health Resources in the Community: °Intensive Outpatient Programs °Organization         Address  Phone  Notes  °[facility] [address], [place], [place] 336-878-6098   °[facility] [address], [place], [place] 336-832-9800   °[facility] [address], [place], [place] ° 336-882-2125   °[facility] [address],  °[place], [place] 1-800-853-5163 or 336-641-4981   °Substance Abuse Resources °Organization         Address  Phone  Notes  °[facility]  336-882-2125   °[facility]  336-784-9470   °[facility]  336-285-9073   °[facility]  336-845-3988   °Residential & Outpatient Substance Abuse Program  1-800-659-3381   °Psychological Services °Organization         Address  Phone  Notes  °[facility]  336- 832-9600   °[facility]  336- 378-7881   °[facility] [address], [place] 1-800-853-5163 or 336-641-4981   ° °Mobile Crisis Teams °Organization         Address  Phone  Notes  °[facility], Mobile Crisis Care Unit  1-877-626-1772   °Assertive °[facility] ° [address]  [place], [place] 336-834-9664   °Sharon DeEsch [address] °[place] 336-554-5454   ° °Self-Help/Support Groups °Organization         Address  Phone             Notes  °[facility] - variety of support groups  336- 373-1402 Call for more information  °Narcotics Anonymous (NA), [facility] [address], °[place]   2 meetings at this location  ° °  Residential Treatment Programs Organization         Address  Phone  Notes  [facility] [address],    [place]  1-514-053-3025   [facility]  [address], [place] [address], [place], [place]   [place], [place] [phone] Admissions: 8am-3pm M-F  [facility] [place] [address]    [place], [place] 030-092-3300   [facility] [address] [place], [place], [place]   [facility] [address]  [place], [place]   [facility] Intensive Outpatient [address], [place], [place]   [facility] [place]  [place], [place] 1-306-693-0018 or 971-630-8327   [facility] [address], [place], [place] Accepts Medicaid  [facility] [address]  [place] 1-440-120-3448 Substance Abuse/Addiction Treatment   [facility] Organization         Address  Phone  Notes  [facility]  2016981031   Domenic Schwab, PhD [address] [place], [place]   985-654-8216 or 671-553-6913   [place], [place] 3327478358   [facility] [address], [place], [place] [phone] Insurance/Medicaid/sponsorship through [facility] [address]                                    [place], [place] (228) [phone] [facility] [address], [place] [phone]    Dr. Adele Schilder  [phone]   [facility] Dept. 1) [address]  2) [place] 3)  [address], [place] [phone]  [phone]   [place] (845) 122-5318 or (314)419-5195 (After Hours)      Take the prescription as directed. Stop taking the flagyl prescription.  Take over the counter benadryl, as directed on packaging, as needed for itching.  If the benadryl is too sedating, take an over the counter non-sedating antihistamine such as claritin, allegra or zyrtec, as directed on packaging.  Call your regular medical doctor on Monday to schedule a follow up appointment within the next 2 days.  Return to the Emergency Department immediately sooner if worsening.

## 2015-01-15 NOTE — ED Notes (Signed)
PT stated she was started on Flagyl for bacterial vaginosis on 01/13/15 and starting itching all over with no hives noted last night. PT also stated this am feels SOB. No facial or tongue swelling noted.

## 2015-04-11 ENCOUNTER — Encounter (HOSPITAL_COMMUNITY): Payer: Self-pay | Admitting: Emergency Medicine

## 2015-04-11 ENCOUNTER — Emergency Department (HOSPITAL_COMMUNITY): Payer: 59

## 2015-04-11 ENCOUNTER — Emergency Department (HOSPITAL_COMMUNITY)
Admission: EM | Admit: 2015-04-11 | Discharge: 2015-04-11 | Disposition: A | Discharge status: Home / Self Care | Payer: 59 | Attending: Emergency Medicine | Admitting: Emergency Medicine

## 2015-04-11 DIAGNOSIS — F419 Anxiety disorder, unspecified: Secondary | ICD-10-CM | POA: Insufficient documentation

## 2015-04-11 DIAGNOSIS — F17210 Nicotine dependence, cigarettes, uncomplicated: Secondary | ICD-10-CM | POA: Insufficient documentation

## 2015-04-11 DIAGNOSIS — Z9049 Acquired absence of other specified parts of digestive tract: Secondary | ICD-10-CM | POA: Insufficient documentation

## 2015-04-11 DIAGNOSIS — M545 Low back pain: Secondary | ICD-10-CM | POA: Insufficient documentation

## 2015-04-11 DIAGNOSIS — R109 Unspecified abdominal pain: Secondary | ICD-10-CM | POA: Insufficient documentation

## 2015-04-11 DIAGNOSIS — R110 Nausea: Secondary | ICD-10-CM | POA: Insufficient documentation

## 2015-04-11 DIAGNOSIS — Z8619 Personal history of other infectious and parasitic diseases: Secondary | ICD-10-CM | POA: Insufficient documentation

## 2015-04-11 DIAGNOSIS — Z91040 Latex allergy status: Secondary | ICD-10-CM | POA: Insufficient documentation

## 2015-04-11 DIAGNOSIS — Z862 Personal history of diseases of the blood and blood-forming organs and certain disorders involving the immune mechanism: Secondary | ICD-10-CM | POA: Insufficient documentation

## 2015-04-11 DIAGNOSIS — G8929 Other chronic pain: Secondary | ICD-10-CM | POA: Insufficient documentation

## 2015-04-11 DIAGNOSIS — Z79818 Long term (current) use of other agents affecting estrogen receptors and estrogen levels: Secondary | ICD-10-CM | POA: Insufficient documentation

## 2015-04-11 DIAGNOSIS — F329 Major depressive disorder, single episode, unspecified: Secondary | ICD-10-CM | POA: Insufficient documentation

## 2015-04-11 DIAGNOSIS — Z9851 Tubal ligation status: Secondary | ICD-10-CM | POA: Insufficient documentation

## 2015-04-11 DIAGNOSIS — Z79899 Other long term (current) drug therapy: Secondary | ICD-10-CM | POA: Insufficient documentation

## 2015-04-11 DIAGNOSIS — Z90710 Acquired absence of both cervix and uterus: Secondary | ICD-10-CM | POA: Insufficient documentation

## 2015-04-11 LAB — BASIC METABOLIC PANEL
ANION GAP: 9 (ref 5–15)
BUN: 17 mg/dL (ref 6–20)
CALCIUM: 9.3 mg/dL (ref 8.9–10.3)
CO2: 27 mmol/L (ref 22–32)
Chloride: 100 mmol/L — ABNORMAL LOW (ref 101–111)
Creatinine, Ser: 1.06 mg/dL — ABNORMAL HIGH (ref 0.44–1.00)
GFR calc Af Amer: >60 mL/min (ref >60)
GLUCOSE: 85 mg/dL (ref 65–99)
POTASSIUM: 4.4 mmol/L (ref 3.5–5.1)
SODIUM: 136 mmol/L (ref 135–145)

## 2015-04-11 LAB — CBC
HEMATOCRIT: 42.3 % (ref 36.0–46.0)
HEMOGLOBIN: 14.1 g/dL (ref 12.0–15.0)
MCH: 30.3 pg (ref 26.0–34.0)
MCHC: 33.3 g/dL (ref 30.0–36.0)
MCV: 91 fL (ref 78.0–100.0)
Platelets: 359 10*3/uL (ref 150–400)
RBC: 4.65 MIL/uL (ref 3.87–5.11)
RDW: 13.9 % (ref 11.5–15.5)
WBC: 11.1 10*3/uL — AB (ref 4.0–10.5)

## 2015-04-11 LAB — URINALYSIS, ROUTINE W REFLEX MICROSCOPIC
BILIRUBIN URINE: NEGATIVE
Glucose, UA: NEGATIVE mg/dL
Hgb urine dipstick: NEGATIVE
KETONES UR: NEGATIVE mg/dL
LEUKOCYTES UA: NEGATIVE
NITRITE: NEGATIVE
Protein, ur: NEGATIVE mg/dL
SPECIFIC GRAVITY, URINE: 1.02 (ref 1.005–1.030)
pH: 5.5 (ref 5.0–8.0)

## 2015-04-11 MED ORDER — ONDANSETRON HCL 4 MG/2ML IJ SOLN
4.0000 mg | Freq: Once | INTRAMUSCULAR | Status: AC
  Administered 2015-04-11: 4 mg via INTRAVENOUS
  Filled 2015-04-11: qty 2

## 2015-04-11 MED ORDER — IOHEXOL 300 MG/ML  SOLN
100.0000 mL | Freq: Once | INTRAMUSCULAR | Status: AC | PRN
  Administered 2015-04-11: 100 mL via INTRAVENOUS

## 2015-04-11 MED ORDER — FENTANYL CITRATE (PF) 100 MCG/2ML IJ SOLN
50.0000 ug | Freq: Once | INTRAMUSCULAR | Status: AC
  Administered 2015-04-11: 50 ug via INTRAVENOUS
  Filled 2015-04-11: qty 2

## 2015-04-11 MED ORDER — CYCLOBENZAPRINE HCL 10 MG PO TABS: 10.0000 mg | ORAL_TABLET | Freq: Two times a day (BID) | ORAL | Status: DC | PRN

## 2015-04-11 MED ORDER — SODIUM CHLORIDE 0.9 % IV BOLUS (SEPSIS)
500.0000 mL | Freq: Once | INTRAVENOUS | Status: AC
  Administered 2015-04-11: 500 mL via INTRAVENOUS

## 2015-04-11 MED ORDER — HYDROCODONE-ACETAMINOPHEN 5-325 MG PO TABS: 1.0000 | ORAL_TABLET | Freq: Four times a day (QID) | ORAL | Status: DC | PRN

## 2015-04-11 MED ORDER — HYDROCODONE-ACETAMINOPHEN 5-325 MG PO TABS
1.0000 | ORAL_TABLET | Freq: Once | ORAL | Status: AC
  Administered 2015-04-11: 1 via ORAL
  Filled 2015-04-11: qty 1

## 2015-04-11 MED ORDER — SODIUM CHLORIDE 0.9 % IV SOLN
INTRAVENOUS | Status: DC
  Administered 2015-04-11: 18:00:00 via INTRAVENOUS

## 2015-04-11 MED ORDER — NAPROXEN 500 MG PO TABS: 500.0000 mg | ORAL_TABLET | Freq: Two times a day (BID) | ORAL | Status: DC

## 2015-04-11 NOTE — ED Notes (Signed)
Patient with no complaints at this time. Respirations even and unlabored. Skin warm/dry. Discharge instructions reviewed with patient at this time. Patient given opportunity to voice concerns/ask questions. IV removed per policy and band-aid applied to site. Patient discharged at this time and left Emergency Department with steady gait.  

## 2015-04-11 NOTE — Discharge Instructions (Signed)
Take the medications as directed for the pain. May be muscular skeletal pain considering that the workup was negative. Take the Flexeril Naprosyn on a regular basis. Supplement with hydrocodone as needed for additional pain relief. Negative limited to follow-up with your doctor. Return for any new or worse symptoms.

## 2015-04-11 NOTE — ED Notes (Signed)
Pt reports R flank pain that started 2 days ago.

## 2015-04-11 NOTE — ED Provider Notes (Signed)
CSN: [id number]     Arrival date & time 04/11/15  1345 History   First MD Initiated Contact with Patient 04/11/15 1542     Chief Complaint  Patient presents with  . Flank Pain     (Consider location/radiation/quality/duration/timing/severity/associated sxs/prior Treatment) Patient is a 44 y.o. female presenting with flank pain. The history is provided by the patient.  Flank Pain Associated symptoms include abdominal pain. Pertinent negatives include no chest pain, no headaches and no shortness of breath.   patient with complaint of right lower back pain right flank pain radiating into right lower quadrant for 3 days. Associated with nausea but no vomiting or diarrhea. Not made worse or better by anything. Pain 0 out of 10. No fevers no dysuria no diarrhea.  Past Medical History  Diagnosis Date  . Sciatica of right side   . PTSD (post-traumatic stress disorder)   . Depression   . Abnormal Pap smear of cervix   . HPV in female   . Anxiety   . Anemia   . Chronic pelvic pain in female   . Chronic back pain    Past Surgical History  Procedure Laterality Date  . Cesarean section    . Cholecystectomy    . Tubal ligation    . Colposcopy vulva w/ biopsy    . Abdominal hysterectomy N/A 09/14/2014    Procedure: HYSTERECTOMY ABDOMINAL;  Surgeon: Florian Buff, MD;  Location: [facility];  Service: Gynecology;  Laterality: N/A;  . Salpingoophorectomy Bilateral 09/14/2014    Procedure: SALPINGO OOPHORECTOMY;  Surgeon: Florian Buff, MD;  Location: [facility];  Service: Gynecology;  Laterality: Bilateral;   Family History  Problem Relation Age of Onset  . Diabetes Mother   . Heart attack Mother   . COPD Mother    Social History  Substance Use Topics  . Smoking status: Current Some Day Smoker -- 1.00 packs/day for 30 years    Types: Cigarettes  . Smokeless tobacco: Never Used  . Alcohol Use: No   OB History    Gravida Para Term Preterm AB TAB SAB Ectopic Multiple Living   4 2 2  2     2       Review of Systems  Constitutional: Negative for fever.  HENT: Negative for congestion.   Eyes: Negative for redness.  Respiratory: Negative for shortness of breath.   Cardiovascular: Negative for chest pain.  Gastrointestinal: Positive for nausea and abdominal pain. Negative for vomiting.  Genitourinary: Positive for flank pain. Negative for dysuria.  Musculoskeletal: Positive for back pain.  Skin: Negative for rash.  Neurological: Negative for headaches.  Hematological: Does not bruise/bleed easily.  Psychiatric/Behavioral: Negative for confusion.      Allergies  Flagyl and Latex  Home Medications   Prior to Admission medications   Medication Sig Start Date End Date Taking? Authorizing Provider  citalopram (CELEXA) 40 MG tablet Take 40 mg by mouth daily.   Yes Historical Provider, MD  estradiol (ESTRACE) 2 MG tablet Take 1 tablet (2 mg total) by mouth daily. 09/26/14  Yes Florian Buff, MD  ibuprofen (ADVIL,MOTRIN) 200 MG tablet Take 800 mg by mouth every 6 (six) hours as needed for mild pain or moderate pain.   Yes Historical Provider, MD  traZODone (DESYREL) 100 MG tablet Take 100-200 mg by mouth at bedtime.    Yes Historical Provider, MD  Cholecalciferol (VITAMIN D PO) Take 1 tablet by mouth once a week. On Monday's    Historical Provider, MD  cyclobenzaprine (FLEXERIL) 10 MG tablet Take 1 tablet (10 mg total) by mouth 2 (two) times daily as needed for muscle spasms. 04/11/15   Fredia Sorrow, MD  HYDROcodone-acetaminophen (NORCO/VICODIN) 5-325 MG per tablet Take 1 tablet by mouth every 4 (four) hours as needed. Patient not taking: Reported on 04/11/2015 10/04/14   Evalee Jefferson, PA-C  HYDROcodone-acetaminophen (NORCO/VICODIN) 5-325 MG tablet Take 1-2 tablets by mouth every 6 (six) hours as needed. 04/11/15   Fredia Sorrow, MD  naproxen (NAPROSYN) 500 MG tablet Take 1 tablet (500 mg total) by mouth 2 (two) times daily. 04/11/15   Fredia Sorrow, MD  predniSONE (DELTASONE) 20  MG tablet Take 2 tablets (40 mg total) by mouth daily. Start 01/16/15 Patient not taking: Reported on 04/11/2015 01/15/15   Francine Graven, DO   BP 114/81 mmHg  Pulse 62  Temp(Src) 98.5 F (36.9 C) (Oral)  Resp 16  Ht 5\' 5"  (1.651 m)  Wt 185 lb (83.915 kg)  BMI 30.79 kg/m2  SpO2 97%  LMP 08/19/2014 Physical Exam  Constitutional: She is oriented to person, place, and time. She appears well-developed and well-nourished. No distress.  HENT:  Head: Normocephalic and atraumatic.  Mouth/Throat: Oropharynx is clear and moist.  Eyes: Conjunctivae and EOM are normal. Pupils are equal, round, and reactive to light.  Neck: Normal range of motion. Neck supple.  Cardiovascular: Normal rate, regular rhythm and normal heart sounds.   No murmur heard. Pulmonary/Chest: Effort normal and breath sounds normal. No respiratory distress.  Abdominal: Soft. Bowel sounds are normal. There is no tenderness.  Musculoskeletal: Normal range of motion.  Neurological: She is alert and oriented to person, place, and time. No cranial nerve deficit. She exhibits normal muscle tone. Coordination normal.  Skin: Skin is warm. No rash noted.  Nursing note and vitals reviewed.   ED Course  Procedures (including critical care time) Labs Review Labs Reviewed  BASIC METABOLIC PANEL - Abnormal; Notable for the following:    Chloride 100 (*)    Creatinine, Ser 1.06 (*)    All other components within normal limits  CBC - Abnormal; Notable for the following:    WBC 11.1 (*)    All other components within normal limits  URINALYSIS, ROUTINE W REFLEX MICROSCOPIC (NOT AT [facility])   Results for orders placed or performed during the hospital encounter of [id number]  Basic metabolic panel  Result Value Ref Range   Sodium 136 135 - 145 mmol/L   Potassium 4.4 3.5 - 5.1 mmol/L   Chloride 100 (L) 101 - 111 mmol/L   CO2 27 22 - 32 mmol/L   Glucose, Bld 85 65 - 99 mg/dL   BUN 17 6 - 20 mg/dL   Creatinine, Ser 1.06 (H) 0.44 -  1.00 mg/dL   Calcium 9.3 8.9 - 10.3 mg/dL   GFR calc non Af Amer >60 >60 mL/min   GFR calc Af Amer >60 >60 mL/min   Anion gap 9 5 - 15  CBC  Result Value Ref Range   WBC 11.1 (H) 4.0 - 10.5 K/uL   RBC 4.65 3.87 - 5.11 MIL/uL   Hemoglobin 14.1 12.0 - 15.0 g/dL   HCT 42.3 36.0 - 46.0 %   MCV 91.0 78.0 - 100.0 fL   MCH 30.3 26.0 - 34.0 pg   MCHC 33.3 30.0 - 36.0 g/dL   RDW 13.9 11.5 - 15.5 %   Platelets 359 150 - 400 K/uL  Urinalysis, Routine w reflex microscopic (not at [facility])  Result Value Ref  Range   Color, Urine YELLOW YELLOW   APPearance CLEAR CLEAR   Specific Gravity, Urine 1.020 1.005 - 1.030   pH 5.5 5.0 - 8.0   Glucose, UA NEGATIVE NEGATIVE mg/dL   Hgb urine dipstick NEGATIVE NEGATIVE   Bilirubin Urine NEGATIVE NEGATIVE   Ketones, ur NEGATIVE NEGATIVE mg/dL   Protein, ur NEGATIVE NEGATIVE mg/dL   Nitrite NEGATIVE NEGATIVE   Leukocytes, UA NEGATIVE NEGATIVE     Imaging Review Ct Abdomen Pelvis W Contrast  04/11/2015  ADDENDUM REPORT: 04/11/2015 18:09 ADDENDUM: Question a small cyst along the right kidney lower pole which is too small to definitively characterize. Electronically Signed   By: Markus Daft M.D.   On: 04/11/2015 18:09  04/11/2015  CLINICAL DATA:  Right flank pain radiates into the right lower quadrant for 2-3 days. EXAM: CT ABDOMEN AND PELVIS WITH CONTRAST TECHNIQUE: Multidetector CT imaging of the abdomen and pelvis was performed using the standard protocol following bolus administration of intravenous contrast. CONTRAST:  124mL OMNIPAQUE IOHEXOL 300 MG/ML  SOLN COMPARISON:  Chest CT 08/19/2014 FINDINGS: Lower chest: Few densities at lung bases are most compatible with atelectasis. Hepatobiliary: The gallbladder has been removed. Normal appearance of the liver and portal venous system is patent. Pancreas: Normal appearance of the pancreas without inflammation or duct dilatation. Spleen: Normal appearance of the spleen without enlargement. Adrenals/Urinary Tract:  Normal appearance of the adrenal glands. Normal appearance of both kidneys and the urinary bladder. No hydronephrosis. Stomach/Bowel: Normal appearance of the stomach and duodenum. Oral contrast in small bowel without dilatation and no evidence for obstruction. Few colonic diverticula without acute colonic inflammation. The appendix is located at the mid abdomen without acute inflammatory changes. Vascular/Lymphatic: Small lymph nodes in the right retrocrural area. Right retrocrural node measures up to 0.6 cm in the short axis. Small lymph nodes in the gastrohepatic ligament. Prominent node just above the pancreatic neck measures 1.1 cm in the short axis on sequence 2, image 22. There is also a prominent lymph node near the porta hepatis measuring 1.4 cm in short axis on sequence 2, image 25. No evidence for an aortic aneurysm. Reproductive: Uterus has been removed. Ovarian tissue is not visualized. Other: No free fluid.  No free air. Musculoskeletal: No acute bone abnormality. Broad-based disc bulge at L4-L5. IMPRESSION: No acute abnormality in the abdomen or pelvis. No findings to explain patient's symptoms. Few prominent lymph nodes in the upper abdomen are nonspecific. Electronically Signed: By: Markus Daft M.D. On: 04/11/2015 18:04   I have personally reviewed and evaluated these images and lab results as part of my medical decision-making.   EKG Interpretation None      MDM   Final diagnoses:  Flank pain    Patient with 3 day history of right back and right flank right lower quadrant abdominal pain. Associated with nausea but no vomiting. Not made worse by anything. Pain was 8 out of 10. Extensive workup here to include CT of the abdomen without any significant findings. Mild leukocytosis otherwise labs were normal. Urinalysis was normal. Symptoms may very well be muscular skeletal in nature. We'll treat symptomatically.     Fredia Sorrow, MD 04/11/15 [phone]

## 2015-08-14 ENCOUNTER — Emergency Department (HOSPITAL_COMMUNITY)
Admission: EM | Admit: 2015-08-14 | Discharge: 2015-08-15 | Disposition: A | Discharge status: Home / Self Care | Payer: 59 | Attending: Emergency Medicine | Admitting: Emergency Medicine

## 2015-08-14 ENCOUNTER — Encounter (HOSPITAL_COMMUNITY): Payer: Self-pay | Admitting: Emergency Medicine

## 2015-08-14 DIAGNOSIS — F329 Major depressive disorder, single episode, unspecified: Secondary | ICD-10-CM | POA: Insufficient documentation

## 2015-08-14 DIAGNOSIS — F17210 Nicotine dependence, cigarettes, uncomplicated: Secondary | ICD-10-CM | POA: Insufficient documentation

## 2015-08-14 DIAGNOSIS — N760 Acute vaginitis: Secondary | ICD-10-CM | POA: Insufficient documentation

## 2015-08-14 DIAGNOSIS — B9689 Other specified bacterial agents as the cause of diseases classified elsewhere: Secondary | ICD-10-CM

## 2015-08-14 LAB — URINALYSIS, ROUTINE W REFLEX MICROSCOPIC
Bilirubin Urine: NEGATIVE
GLUCOSE, UA: NEGATIVE mg/dL
Hgb urine dipstick: NEGATIVE
KETONES UR: NEGATIVE mg/dL
LEUKOCYTES UA: NEGATIVE
Nitrite: NEGATIVE
PH: 6 (ref 5.0–8.0)
PROTEIN: NEGATIVE mg/dL
Specific Gravity, Urine: 1.02 (ref 1.005–1.030)

## 2015-08-14 NOTE — ED Notes (Signed)
Started having pain and pressure in lower abd with painful urination that started 4 days ago

## 2015-08-14 NOTE — ED Provider Notes (Signed)
CSN: [id number]     Arrival date & time 08/14/15  2022 History  By signing my name below, I, Soijett Blue, attest that this documentation has been prepared under the direction and in the presence of Rolland Porter, MD at 2318. Electronically Signed: Soijett Blue, ED Scribe. 08/14/2015. 11:29 PM.   Chief Complaint  Patient presents with  . Dysuria      The history is provided by the patient. No language interpreter was used.    HPI Comments: Jennifer Rollins is a 45 y.o. female who presents to the Emergency Department complaining of progressively worsening painful urination x 4 days. She notes that she initially noticed lower abdominal pressure in the suprapubic area and she had just began a new diet where she stopped drinking sodas. Pt thinks that she may be dehydrated. She states that she is having associated symptoms of lower abdominal pressure, nausea, lower back pain, fever yesterday of 100, and chills. Denies any worsening or alleviating factors for her abdominal pressure. She states that she has tried AZO with no relief for her symptoms. She denies hematuria, frequency, vomiting, vaginal discharge, and any other symptoms. Denies PCP. Pt currently smokes 1.5 PPD. Pt notes that she used to work at [employer]. Pt takes trazodone, estrogen, and celexa daily that are Rx by [facility] and Dr. Elonda Husky.   PCP none  Past Medical History  Diagnosis Date  . Sciatica of right side   . PTSD (post-traumatic stress disorder)   . Depression   . Abnormal Pap smear of cervix   . HPV in female   . Anxiety   . Anemia   . Chronic pelvic pain in female   . Chronic back pain    Past Surgical History  Procedure Laterality Date  . Cesarean section    . Cholecystectomy    . Tubal ligation    . Colposcopy vulva w/ biopsy    . Abdominal hysterectomy N/A 09/14/2014    Procedure: HYSTERECTOMY ABDOMINAL;  Surgeon: Florian Buff, MD;  Location: [facility];  Service: Gynecology;  Laterality: N/A;  .  Salpingoophorectomy Bilateral 09/14/2014    Procedure: SALPINGO OOPHORECTOMY;  Surgeon: Florian Buff, MD;  Location: [facility];  Service: Gynecology;  Laterality: Bilateral;   Family History  Problem Relation Age of Onset  . Diabetes Mother   . Heart attack Mother   . COPD Mother    Social History  Substance Use Topics  . Smoking status: Current Every Day Smoker -- 1.50 packs/day for 30 years    Types: Cigarettes  . Smokeless tobacco: Never Used  . Alcohol Use: No   unemployed  OB History    Gravida Para Term Preterm AB TAB SAB Ectopic Multiple Living   4 2 2  2     2      Review of Systems  Constitutional: Positive for fever and chills.  Gastrointestinal: Positive for nausea and abdominal pain. Negative for vomiting.  Genitourinary: Positive for dysuria. Negative for frequency, hematuria and vaginal discharge.  Musculoskeletal: Positive for back pain.  All other systems reviewed and are negative.     Allergies  Flagyl and Latex  Home Medications   Prior to Admission medications   Medication Sig Start Date End Date Taking? Authorizing Provider  Cholecalciferol (VITAMIN D PO) Take 1 tablet by mouth once a week. On Monday's    Historical Provider, MD  citalopram (CELEXA) 40 MG tablet Take 40 mg by mouth daily.    Historical Provider, MD  clindamycin (  CLEOCIN) 150 MG capsule Take 2 capsules (300 mg total) by mouth 2 (two) times daily. 08/15/15   Rolland Porter, MD  cyclobenzaprine (FLEXERIL) 10 MG tablet Take 1 tablet (10 mg total) by mouth 2 (two) times daily as needed for muscle spasms. 04/11/15   Fredia Sorrow, MD  estradiol (ESTRACE) 2 MG tablet Take 1 tablet (2 mg total) by mouth daily. 09/26/14   Florian Buff, MD  HYDROcodone-acetaminophen (NORCO/VICODIN) 5-325 MG per tablet Take 1 tablet by mouth every 4 (four) hours as needed. Patient not taking: Reported on 04/11/2015 10/04/14   Evalee Jefferson, PA-C  HYDROcodone-acetaminophen (NORCO/VICODIN) 5-325 MG tablet Take 1-2 tablets by  mouth every 6 (six) hours as needed. 04/11/15   Fredia Sorrow, MD  ibuprofen (ADVIL,MOTRIN) 200 MG tablet Take 800 mg by mouth every 6 (six) hours as needed for mild pain or moderate pain.    Historical Provider, MD  naproxen (NAPROSYN) 500 MG tablet Take 1 tablet (500 mg total) by mouth 2 (two) times daily. 04/11/15   Fredia Sorrow, MD  predniSONE (DELTASONE) 20 MG tablet Take 2 tablets (40 mg total) by mouth daily. Start 01/16/15 Patient not taking: Reported on 04/11/2015 01/15/15   Francine Graven, DO  traZODone (DESYREL) 100 MG tablet Take 100-200 mg by mouth at bedtime.     Historical Provider, MD   BP 110/73 mmHg  Pulse 57  Temp(Src) 98.2 F (36.8 C) (Oral)  Resp 18  Ht 5\' 5"  (1.651 m)  Wt 180 lb (81.647 kg)  BMI 29.95 kg/m2  SpO2 97%  LMP 08/19/2014  Vital signs normal except bradycardia  Physical Exam  Constitutional: She is oriented to person, place, and time. She appears well-developed and well-nourished.  Non-toxic appearance. She does not appear ill. No distress.  HENT:  Head: Normocephalic and atraumatic.  Right Ear: External ear normal.  Left Ear: External ear normal.  Nose: Nose normal. No mucosal edema or rhinorrhea.  Mouth/Throat: Oropharynx is clear and moist and mucous membranes are normal. No dental abscesses or uvula swelling.  Eyes: Conjunctivae and EOM are normal. Pupils are equal, round, and reactive to light.  Neck: Normal range of motion and full passive range of motion without pain. Neck supple.  Cardiovascular: Normal rate, regular rhythm and normal heart sounds.  Exam reveals no gallop and no friction rub.   No murmur heard. Pulmonary/Chest: Effort normal and breath sounds normal. No respiratory distress. She has no wheezes. She has no rhonchi. She has no rales. She exhibits no tenderness and no crepitus.  Abdominal: Soft. Normal appearance and bowel sounds are normal. She exhibits no distension. There is tenderness in the suprapubic area. There is no  rebound and no guarding.  No pain with knee capping or no psaos sign.   Genitourinary:  No CVAT. White discharge in the vault, no uterus palpated, nontender over adenexa, which I do not appreciate.   Musculoskeletal: Normal range of motion. She exhibits no edema or tenderness.  Moves all extremities well.   Neurological: She is alert and oriented to person, place, and time. She has normal strength. No cranial nerve deficit.  Skin: Skin is warm, dry and intact. No rash noted. No erythema. No pallor.  Psychiatric: She has a normal mood and affect. Her speech is normal and behavior is normal. Her mood appears not anxious.  Nursing note and vitals reviewed.   ED Course  Procedures (including critical care time) DIAGNOSTIC STUDIES: Oxygen Saturation is 100% on RA, nl by my interpretation.  COORDINATION OF CARE: 11:27 PM Discussed treatment plan with pt at bedside which includes UA and pelvic and pt agreed to plan.  Patient was allergic to Flagyl, on review of the [facility] website alternative regimen was clindamycin 300 mg orally twice a day for 7 days or clindamycin cream intravaginally at bedtime for 7 days.   Results for orders placed or performed during the hospital encounter of 08/14/15  Wet prep, genital  Result Value Ref Range   Yeast Wet Prep HPF POC NONE SEEN NONE SEEN   Trich, Wet Prep NONE SEEN NONE SEEN   Clue Cells Wet Prep HPF POC PRESENT (A) NONE SEEN   WBC, Wet Prep HPF POC NONE SEEN NONE SEEN   Sperm NONE SEEN   Urinalysis, Routine w reflex microscopic (not at [facility])  Result Value Ref Range   Color, Urine ORANGE (A) YELLOW   APPearance CLEAR CLEAR   Specific Gravity, Urine 1.020 1.005 - 1.030   pH 6.0 5.0 - 8.0   Glucose, UA NEGATIVE NEGATIVE mg/dL   Hgb urine dipstick NEGATIVE NEGATIVE   Bilirubin Urine NEGATIVE NEGATIVE   Ketones, ur NEGATIVE NEGATIVE mg/dL   Protein, ur NEGATIVE NEGATIVE mg/dL   Nitrite NEGATIVE NEGATIVE   Leukocytes, UA NEGATIVE NEGATIVE    Laboratory interpretation all normal except for BV     MDM   Final diagnoses:  BV (bacterial vaginosis)    New Prescriptions   CLINDAMYCIN (CLEOCIN) 150 MG CAPSULE    Take 2 capsules (300 mg total) by mouth 2 (two) times daily.    Plan discharge  Rolland Porter, MD, FACEP   I personally performed the services described in this documentation, which was scribed in my presence. The recorded information has been reviewed and considered.  Rolland Porter, MD, Barbette Or, MD 08/15/15 [phone]

## 2015-08-15 LAB — WET PREP, GENITAL
SPERM: NONE SEEN
Trich, Wet Prep: NONE SEEN
WBC, Wet Prep HPF POC: NONE SEEN
Yeast Wet Prep HPF POC: NONE SEEN

## 2015-08-15 MED ORDER — CLINDAMYCIN HCL 150 MG PO CAPS: 300.0000 mg | ORAL_CAPSULE | Freq: Two times a day (BID) | ORAL | Status: DC

## 2015-08-15 NOTE — Discharge Instructions (Signed)
Drink plenty of fluids. Take the antibiotics until gone (it will be less expensive at [facility]). Continue the Azo for your symptoms. Follow up with Dr Elonda Husky if you symptoms aren't improving.    Bacterial Vaginosis Bacterial vaginosis is an infection of the vagina. It happens when too many germs (bacteria) grow in the vagina. Having this infection puts you at risk for getting other infections from sex. Treating this infection can help lower your risk for other infections, such as:   Chlamydia.  Gonorrhea.  HIV.  Herpes. HOME CARE  Take your medicine as told by your doctor.  Finish your medicine even if you start to feel better.  Tell your sex partner that you have an infection. They should see their doctor for treatment.  During treatment:  Avoid sex or use condoms correctly.  Do not douche.  Do not drink alcohol unless your doctor tells you it is ok.  Do not breastfeed unless your doctor tells you it is ok. GET HELP IF:  You are not getting better after 3 days of treatment.  You have more grey fluid (discharge) coming from your vagina than before.  You have more pain than before.  You have a fever. MAKE SURE YOU:   Understand these instructions.  Will watch your condition.  Will get help right away if you are not doing well or get worse.   This information is not intended to replace advice given to you by your health care provider. Make sure you discuss any questions you have with your health care provider.   Document Released: 02/20/2008 Document Revised: 06/03/2014 Document Reviewed: 12/23/2012 Elsevier Interactive Patient Education [employer].

## 2015-08-16 LAB — GC/CHLAMYDIA PROBE AMP (~~LOC~~) NOT AT ARMC
Chlamydia: NEGATIVE
Neisseria Gonorrhea: NEGATIVE

## 2015-08-16 LAB — HIV ANTIBODY (ROUTINE TESTING W REFLEX): HIV Screen 4th Generation wRfx: NONREACTIVE

## 2015-09-14 ENCOUNTER — Encounter (HOSPITAL_COMMUNITY): Payer: Self-pay | Admitting: *Deleted

## 2015-09-14 ENCOUNTER — Emergency Department (HOSPITAL_COMMUNITY): Payer: Self-pay

## 2015-09-14 ENCOUNTER — Emergency Department (HOSPITAL_COMMUNITY)
Admission: EM | Admit: 2015-09-14 | Discharge: 2015-09-14 | Disposition: A | Discharge status: Home / Self Care | Payer: Self-pay | Attending: Emergency Medicine | Admitting: Emergency Medicine

## 2015-09-14 DIAGNOSIS — F329 Major depressive disorder, single episode, unspecified: Secondary | ICD-10-CM | POA: Insufficient documentation

## 2015-09-14 DIAGNOSIS — F17210 Nicotine dependence, cigarettes, uncomplicated: Secondary | ICD-10-CM | POA: Insufficient documentation

## 2015-09-14 DIAGNOSIS — Z79899 Other long term (current) drug therapy: Secondary | ICD-10-CM | POA: Insufficient documentation

## 2015-09-14 DIAGNOSIS — K529 Noninfective gastroenteritis and colitis, unspecified: Secondary | ICD-10-CM

## 2015-09-14 DIAGNOSIS — R197 Diarrhea, unspecified: Secondary | ICD-10-CM | POA: Insufficient documentation

## 2015-09-14 LAB — COMPREHENSIVE METABOLIC PANEL
ALT: 46 U/L (ref 14–54)
AST: 45 U/L — ABNORMAL HIGH (ref 15–41)
Albumin: 3.3 g/dL — ABNORMAL LOW (ref 3.5–5.0)
Alkaline Phosphatase: 178 U/L — ABNORMAL HIGH (ref 38–126)
Anion gap: 8 (ref 5–15)
BUN: 11 mg/dL (ref 6–20)
CHLORIDE: 106 mmol/L (ref 101–111)
CO2: 26 mmol/L (ref 22–32)
CREATININE: 1.07 mg/dL — AB (ref 0.44–1.00)
Calcium: 8.9 mg/dL (ref 8.9–10.3)
GFR calc Af Amer: >60 mL/min (ref >60)
GFR calc non Af Amer: >60 mL/min (ref >60)
Glucose, Bld: 92 mg/dL (ref 65–99)
POTASSIUM: 3.7 mmol/L (ref 3.5–5.1)
SODIUM: 140 mmol/L (ref 135–145)
Total Bilirubin: 0.2 mg/dL — ABNORMAL LOW (ref 0.3–1.2)
Total Protein: 7.5 g/dL (ref 6.5–8.1)

## 2015-09-14 LAB — URINALYSIS, ROUTINE W REFLEX MICROSCOPIC
Bilirubin Urine: NEGATIVE
Glucose, UA: NEGATIVE mg/dL
HGB URINE DIPSTICK: NEGATIVE
Ketones, ur: NEGATIVE mg/dL
LEUKOCYTES UA: NEGATIVE
Nitrite: NEGATIVE
PH: 5.5 (ref 5.0–8.0)
PROTEIN: NEGATIVE mg/dL

## 2015-09-14 LAB — CBC
HEMATOCRIT: 39.9 % (ref 36.0–46.0)
HEMOGLOBIN: 13.1 g/dL (ref 12.0–15.0)
MCH: 29.9 pg (ref 26.0–34.0)
MCHC: 32.8 g/dL (ref 30.0–36.0)
MCV: 91.1 fL (ref 78.0–100.0)
PLATELETS: 352 10*3/uL (ref 150–400)
RBC: 4.38 MIL/uL (ref 3.87–5.11)
RDW: 13.8 % (ref 11.5–15.5)
WBC: 9 10*3/uL (ref 4.0–10.5)

## 2015-09-14 LAB — TROPONIN I

## 2015-09-14 LAB — LIPASE, BLOOD: LIPASE: 29 U/L (ref 11–51)

## 2015-09-14 MED ORDER — ONDANSETRON HCL 4 MG/2ML IJ SOLN
4.0000 mg | Freq: Once | INTRAMUSCULAR | Status: AC
  Administered 2015-09-14: 4 mg via INTRAVENOUS
  Filled 2015-09-14: qty 2

## 2015-09-14 MED ORDER — ONDANSETRON 4 MG PO TBDP: ORAL_TABLET | ORAL | Status: DC

## 2015-09-14 MED ORDER — DICYCLOMINE HCL 20 MG PO TABS: ORAL_TABLET | ORAL | Status: DC

## 2015-09-14 MED ORDER — SODIUM CHLORIDE 0.9 % IV BOLUS (SEPSIS)
1000.0000 mL | Freq: Once | INTRAVENOUS | Status: AC
  Administered 2015-09-14: 1000 mL via INTRAVENOUS

## 2015-09-14 MED ORDER — CIPROFLOXACIN HCL 500 MG PO TABS: ORAL_TABLET | ORAL | Status: DC

## 2015-09-14 NOTE — ED Notes (Signed)
Verbal order for troponin add on.

## 2015-09-14 NOTE — ED Provider Notes (Signed)
CSN: [id number]     Arrival date & time 09/14/15  [id number] History  By signing my name below, I, Eustaquio Maize, attest that this documentation has been prepared under the direction and in the presence of Milton Ferguson, MD. Electronically Signed: Eustaquio Maize, ED Scribe. 8:03 AM.  09/14/2015  Chief Complaint  Patient presents with  . Abdominal Pain   Patient is a 45 y.o. female presenting with abdominal pain. The history is provided by the patient. No language interpreter was used.  Abdominal Pain Pain location:  RUQ and RLQ Pain quality: cramping   Pain radiates to:  Does not radiate Pain severity:  Moderate Onset quality:  Gradual Duration:  3 days Timing:  Constant Progression:  Unchanged Chronicity:  New Context: not sick contacts   Associated symptoms: chills, diarrhea, fever and nausea   Associated symptoms: no chest pain, no cough, no fatigue, no hematuria and no vomiting      HPI Comments: Jennifer Rollins is a 44 y.o. female who presents to the Emergency Department complaining of gradual onset, constant, cramping, lower abdominal pain x 3 days. Pt also complains of fever, chills, diarrhea, abdominal bloating, and nausea. She states that she noticed bright red blood in her stools yesterday but attributes it to a hemorrhoid. No recent sick contact with similar symptoms. Denies vomiting or any other associated symptoms.   Past Medical History  Diagnosis Date  . Sciatica of right side   . PTSD (post-traumatic stress disorder)   . Depression   . Abnormal Pap smear of cervix   . HPV in female   . Anxiety   . Anemia   . Chronic pelvic pain in female   . Chronic back pain    Past Surgical History  Procedure Laterality Date  . Cesarean section    . Cholecystectomy    . Tubal ligation    . Colposcopy vulva w/ biopsy    . Abdominal hysterectomy N/A 09/14/2014    Procedure: HYSTERECTOMY ABDOMINAL;  Surgeon: Florian Buff, MD;  Location: [facility];  Service: Gynecology;  Laterality:  N/A;  . Salpingoophorectomy Bilateral 09/14/2014    Procedure: SALPINGO OOPHORECTOMY;  Surgeon: Florian Buff, MD;  Location: [facility];  Service: Gynecology;  Laterality: Bilateral;   Family History  Problem Relation Age of Onset  . Diabetes Mother   . Heart attack Mother   . COPD Mother    Social History  Substance Use Topics  . Smoking status: Current Every Day Smoker -- 1.50 packs/day for 30 years    Types: Cigarettes  . Smokeless tobacco: Never Used  . Alcohol Use: No   OB History    Gravida Para Term Preterm AB TAB SAB Ectopic Multiple Living   4 2 2  2     2      Review of Systems  Constitutional: Positive for fever and chills. Negative for appetite change and fatigue.  HENT: Negative for congestion, ear discharge and sinus pressure.   Eyes: Negative for discharge.  Respiratory: Negative for cough.   Cardiovascular: Negative for chest pain.  Gastrointestinal: Positive for nausea, abdominal pain, diarrhea, blood in stool and abdominal distention. Negative for vomiting.  Genitourinary: Negative for frequency and hematuria.  Musculoskeletal: Negative for back pain.  Skin: Negative for rash.  Neurological: Negative for seizures and headaches.  Psychiatric/Behavioral: Negative for hallucinations.   Allergies  Flagyl and Latex  Home Medications   Prior to Admission medications   Medication Sig Start Date End Date Taking? Authorizing Provider  Cholecalciferol (VITAMIN D PO) Take 1 tablet by mouth once a week. On Monday's    Historical Provider, MD  citalopram (CELEXA) 40 MG tablet Take 40 mg by mouth daily.    Historical Provider, MD  clindamycin (CLEOCIN) 150 MG capsule Take 2 capsules (300 mg total) by mouth 2 (two) times daily. 08/15/15   Rolland Porter, MD  cyclobenzaprine (FLEXERIL) 10 MG tablet Take 1 tablet (10 mg total) by mouth 2 (two) times daily as needed for muscle spasms. 04/11/15   Fredia Sorrow, MD  estradiol (ESTRACE) 2 MG tablet Take 1 tablet (2 mg total) by  mouth daily. 09/26/14   Florian Buff, MD  HYDROcodone-acetaminophen (NORCO/VICODIN) 5-325 MG per tablet Take 1 tablet by mouth every 4 (four) hours as needed. Patient not taking: Reported on 04/11/2015 10/04/14   Evalee Jefferson, PA-C  HYDROcodone-acetaminophen (NORCO/VICODIN) 5-325 MG tablet Take 1-2 tablets by mouth every 6 (six) hours as needed. 04/11/15   Fredia Sorrow, MD  ibuprofen (ADVIL,MOTRIN) 200 MG tablet Take 800 mg by mouth every 6 (six) hours as needed for mild pain or moderate pain.    Historical Provider, MD  naproxen (NAPROSYN) 500 MG tablet Take 1 tablet (500 mg total) by mouth 2 (two) times daily. 04/11/15   Fredia Sorrow, MD  predniSONE (DELTASONE) 20 MG tablet Take 2 tablets (40 mg total) by mouth daily. Start 01/16/15 Patient not taking: Reported on 04/11/2015 01/15/15   Francine Graven, DO  traZODone (DESYREL) 100 MG tablet Take 100-200 mg by mouth at bedtime.     Historical Provider, MD   BP 133/77 mmHg  Pulse 55  Temp(Src) 97.6 F (36.4 C) (Oral)  Resp 16  Ht 5\' 5"  (1.651 m)  Wt 190 lb (86.183 kg)  BMI 31.62 kg/m2  SpO2 100%  LMP 08/19/2014   Physical Exam  Constitutional: She is oriented to person, place, and time. She appears well-developed.  HENT:  Head: Normocephalic.  Eyes: Conjunctivae and EOM are normal. No scleral icterus.  Neck: Neck supple. No thyromegaly present.  Cardiovascular: Normal rate and regular rhythm.  Exam reveals no gallop and no friction rub.   No murmur heard. Pulmonary/Chest: No stridor. She has no wheezes. She has no rales. She exhibits no tenderness.  Abdominal: She exhibits no distension. There is generalized tenderness. There is no rebound.  Mild tenderness throughout abdomen  Musculoskeletal: Normal range of motion. She exhibits no edema.  Lymphadenopathy:    She has no cervical adenopathy.  Neurological: She is oriented to person, place, and time. She exhibits normal muscle tone. Coordination normal.  Skin: No rash noted. No  erythema.  Psychiatric: She has a normal mood and affect. Her behavior is normal.    ED Course  Procedures (including critical care time)  DIAGNOSTIC STUDIES: Oxygen Saturation is 100% on RA, normal by my interpretation.    COORDINATION OF CARE: 8:04 AM-Discussed treatment plan which includes DG Abdominal series, Lipase, CMP, CBC, and UA with pt at bedside and pt agreed to plan.   Labs Review Labs Reviewed  LIPASE, BLOOD  COMPREHENSIVE METABOLIC PANEL  CBC  URINALYSIS, ROUTINE W REFLEX MICROSCOPIC (NOT AT [facility])    Imaging Review No results found. I have personally reviewed and evaluated these images and lab results as part of my medical decision-making.   EKG Interpretation None      MDM   Final diagnoses:  None    Patient bloody diarrhea possible colitis will treat with Cipro and Bentyl and Zofran. Patient will follow-up with  her PCP next week.  The chart was scribed for me under my direct supervision.  I personally performed the history, physical, and medical decision making and all procedures in the evaluation of this patient.Milton Ferguson, MD 09/14/15 [phone]

## 2015-09-14 NOTE — ED Notes (Signed)
Pt verbalizes she is having chest pain and some shortness of breath. Pt placed on the heart monitor and EKG taken. Oxygen saturation 97-98% on RA. No breathing difficulties noted.

## 2015-09-14 NOTE — ED Notes (Signed)
Pt states she began having lower abdominal pain starting on the 18th. Pt has had diarrhea in addition to this. Yesterday pt had 2 episodes of bright red bloody stools yesterday with none today. Pt alert and oriented x4.

## 2015-09-14 NOTE — Discharge Instructions (Signed)
Follow up with your md next week.  Drink plenty of fluids °

## 2015-09-21 ENCOUNTER — Other Ambulatory Visit: Payer: Self-pay | Admitting: Obstetrics & Gynecology

## 2015-09-30 ENCOUNTER — Encounter (HOSPITAL_COMMUNITY): Payer: Self-pay | Admitting: Emergency Medicine

## 2015-09-30 ENCOUNTER — Emergency Department (HOSPITAL_COMMUNITY)
Admission: EM | Admit: 2015-09-30 | Discharge: 2015-09-30 | Disposition: A | Discharge status: Home / Self Care | Payer: Self-pay | Attending: Emergency Medicine | Admitting: Emergency Medicine

## 2015-09-30 DIAGNOSIS — F17210 Nicotine dependence, cigarettes, uncomplicated: Secondary | ICD-10-CM | POA: Insufficient documentation

## 2015-09-30 DIAGNOSIS — Z79899 Other long term (current) drug therapy: Secondary | ICD-10-CM | POA: Insufficient documentation

## 2015-09-30 DIAGNOSIS — B370 Candidal stomatitis: Secondary | ICD-10-CM

## 2015-09-30 DIAGNOSIS — Z791 Long term (current) use of non-steroidal anti-inflammatories (NSAID): Secondary | ICD-10-CM | POA: Insufficient documentation

## 2015-09-30 DIAGNOSIS — F329 Major depressive disorder, single episode, unspecified: Secondary | ICD-10-CM | POA: Insufficient documentation

## 2015-09-30 MED ORDER — BENZONATATE 100 MG PO CAPS: 100.0000 mg | ORAL_CAPSULE | Freq: Three times a day (TID) | ORAL | Status: DC | PRN

## 2015-09-30 MED ORDER — ALBUTEROL SULFATE HFA 108 (90 BASE) MCG/ACT IN AERS
2.0000 | INHALATION_SPRAY | Freq: Once | RESPIRATORY_TRACT | Status: AC
  Administered 2015-09-30: 2 via RESPIRATORY_TRACT
  Filled 2015-09-30: qty 6.7

## 2015-09-30 MED ORDER — FLUCONAZOLE 200 MG PO TABS: 200.0000 mg | ORAL_TABLET | Freq: Every day | ORAL | Status: DC

## 2015-09-30 MED ORDER — FLUCONAZOLE 100 MG PO TABS
200.0000 mg | ORAL_TABLET | Freq: Once | ORAL | Status: AC
  Administered 2015-09-30: 200 mg via ORAL
  Filled 2015-09-30: qty 2

## 2015-09-30 NOTE — ED Notes (Signed)
Pt states she took an antibiotic for colitis 2 weeks ago and has developed white patches in her mouth with a sore throat.

## 2015-09-30 NOTE — ED Notes (Signed)
Possible thrush to mouth-white patches throughout mouth and sore per pt, sore throat, productive cough of green phlegm per pt as well, denies N/V or fevers

## 2015-09-30 NOTE — ED Provider Notes (Signed)
CSN: [id number]     Arrival date & time 09/30/15  1356 History   First MD Initiated Contact with Patient 09/30/15 1439     Chief Complaint  Patient presents with  . Sore Throat     (Consider location/radiation/quality/duration/timing/severity/associated sxs/prior Treatment) Patient is a 45 y.o. female presenting with pharyngitis.  Sore Throat This is a new problem. The problem occurs constantly. The problem has not changed since onset.Pertinent negatives include no chest pain, no headaches and no shortness of breath. Nothing aggravates the symptoms.    Past Medical History  Diagnosis Date  . Sciatica of right side   . PTSD (post-traumatic stress disorder)   . Depression   . Abnormal Pap smear of cervix   . HPV in female   . Anxiety   . Anemia   . Chronic pelvic pain in female   . Chronic back pain    Past Surgical History  Procedure Laterality Date  . Cesarean section    . Cholecystectomy    . Tubal ligation    . Colposcopy vulva w/ biopsy    . Abdominal hysterectomy N/A 09/14/2014    Procedure: HYSTERECTOMY ABDOMINAL;  Surgeon: Florian Buff, MD;  Location: [facility];  Service: Gynecology;  Laterality: N/A;  . Salpingoophorectomy Bilateral 09/14/2014    Procedure: SALPINGO OOPHORECTOMY;  Surgeon: Florian Buff, MD;  Location: [facility];  Service: Gynecology;  Laterality: Bilateral;   Family History  Problem Relation Age of Onset  . Diabetes Mother   . Heart attack Mother   . COPD Mother    Social History  Substance Use Topics  . Smoking status: Current Every Day Smoker -- 0.50 packs/day for 30 years    Types: Cigarettes  . Smokeless tobacco: Never Used  . Alcohol Use: No   OB History    Gravida Para Term Preterm AB TAB SAB Ectopic Multiple Living   4 2 2  2     2      Review of Systems  Constitutional: Negative for fever, chills and fatigue.  HENT: Positive for sore throat.   Respiratory: Negative for shortness of breath.   Cardiovascular: Negative for chest pain.   Neurological: Negative for headaches.  All other systems reviewed and are negative.     Allergies  Flagyl and Latex  Home Medications   Prior to Admission medications   Medication Sig Start Date End Date Taking? Authorizing Provider  citalopram (CELEXA) 40 MG tablet Take 40 mg by mouth daily.   Yes Historical Provider, MD  estradiol (ESTRACE) 2 MG tablet TAKE ONE TABLET BY MOUTH ONCE DAILY 09/22/15  Yes Florian Buff, MD  ibuprofen (ADVIL,MOTRIN) 200 MG tablet Take 800 mg by mouth every 6 (six) hours as needed for mild pain or moderate pain.   Yes Historical Provider, MD  traZODone (DESYREL) 100 MG tablet Take 100-200 mg by mouth at bedtime.    Yes Historical Provider, MD  benzonatate (TESSALON) 100 MG capsule Take 1 capsule (100 mg total) by mouth 3 (three) times daily as needed for cough. 09/30/15   Merrily Pew, MD  fluconazole (DIFLUCAN) 200 MG tablet Take 1 tablet (200 mg total) by mouth daily. 09/30/15   Merrily Pew, MD   BP 135/77 mmHg  Pulse 69  Temp(Src) 97.6 F (36.4 C) (Temporal)  Resp 18  Ht 5\' 5"  (1.651 m)  Wt 190 lb (86.183 kg)  BMI 31.62 kg/m2  SpO2 100%  LMP 08/19/2014 Physical Exam  Constitutional: She is oriented to person, place,  and time. She appears well-developed and well-nourished.  HENT:  Head: Normocephalic and atraumatic.  Multiple white plaques in multiple areas of mouth.    Neck: Normal range of motion.  Cardiovascular: Normal rate and regular rhythm.   Pulmonary/Chest: Effort normal. No stridor. No respiratory distress. She has no wheezes. She has no rales.  Abdominal: Soft. She exhibits no distension. There is no tenderness.  Musculoskeletal: Normal range of motion. She exhibits no edema or tenderness.  Neurological: She is alert and oriented to person, place, and time.  Skin: Skin is warm and dry. No rash noted.  Nursing note and vitals reviewed.   ED Course  Procedures (including critical care time) Labs Review Labs Reviewed - No data to  display  Imaging Review No results found. I have personally reviewed and evaluated these images and lab results as part of my medical decision-making.   EKG Interpretation None      MDM   Final diagnoses:  Thrush, oral    Likely thrush from being on antibioti8cs. Can't afford magic mouthwash, will use fluconazole. Also with cough and likely acute sinusitis, viral. Doesn't want abx. D/W her quitting smoking, however has no desire. Also discussed with her that sometimes thrush can be a sign of a systemic disease so if it is not improving, she needs to fu w/ PCP.   New Prescriptions: New Prescriptions   BENZONATATE (TESSALON) 100 MG CAPSULE    Take 1 capsule (100 mg total) by mouth 3 (three) times daily as needed for cough.   FLUCONAZOLE (DIFLUCAN) 200 MG TABLET    Take 1 tablet (200 mg total) by mouth daily.     I have personally and contemperaneously reviewed labs and imaging and used in my decision making as above.   A medical screening exam was performed and I feel the patient has had an appropriate workup for their chief complaint at this time and likelihood of emergent condition existing is low and thus workup can continue on an outpatient basis.. Their vital signs are stable. They have been counseled on decision, discharge, follow up and which symptoms necessitate immediate return to the emergency department.  They verbally stated understanding and agreement with plan and discharged in stable condition.      Merrily Pew, MD 09/30/15 [phone]

## 2016-04-16 IMAGING — CT CT CHEST W/ CM
2 of 4 series · 15 of 36 positions shown, 18 images · IV contrast (Omnipaque 300)
Comparison: No priors.

CLINICAL DATA: 43-year-old female with right-sided pleuritic chest
pain for the past several months, with left-sided chest pain for the
past 3 days. History of cervical cancer.

EXAM:
CT CHEST WITH CONTRAST
TECHNIQUE: Multidetector CT imaging of the chest was performed during
intravenous contrast administration.
CONTRAST:  80mL OMNIPAQUE IOHEXOL 300 MG/ML  SOLN

[Series 2: chestroutine 5.0 b40f · axial · 0.66mm/px · z∈[-299,-29]mm · 12 of 64 slices shown, 15 images]
[im 5/64  mediastinal]
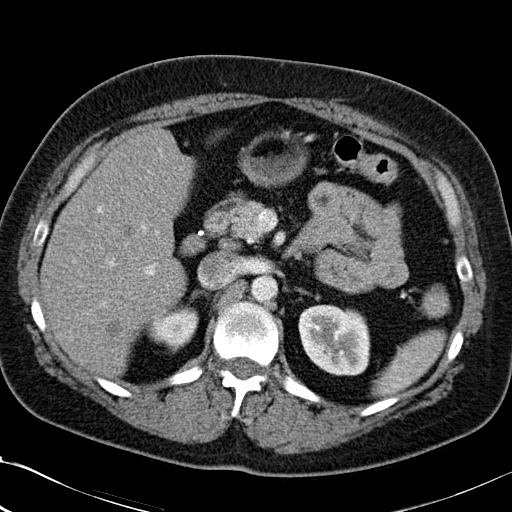
[im 5/64  lung]
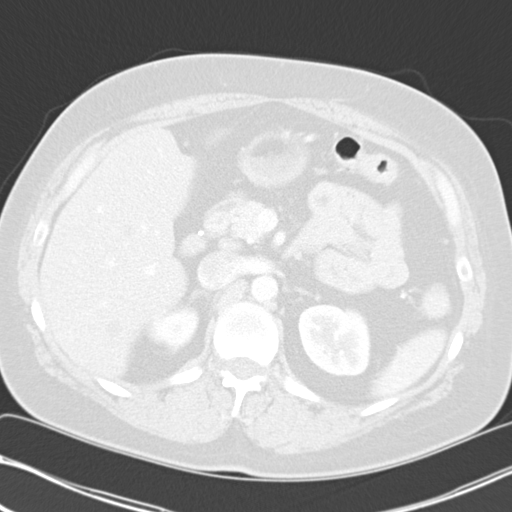
[im 10/64  lung]
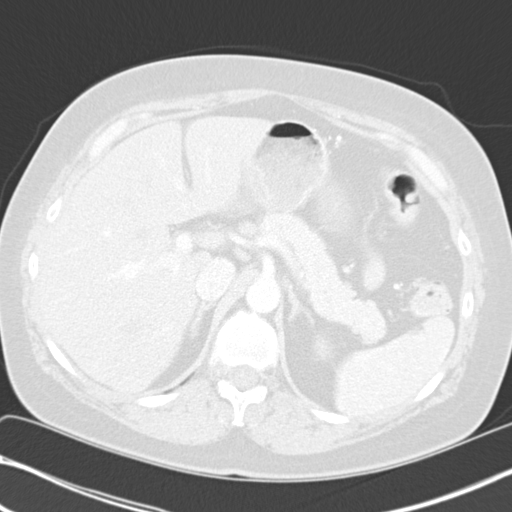
[im 14/64  lung]
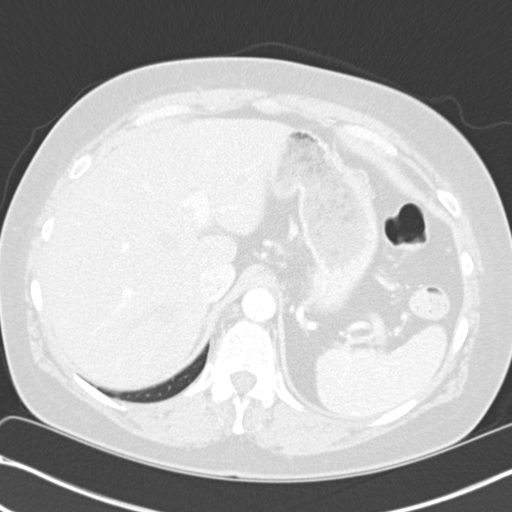
[im 19/64  lung]
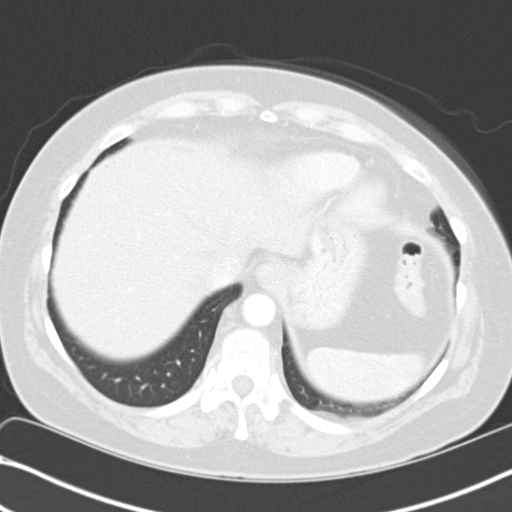
[im 23/64  mediastinal]
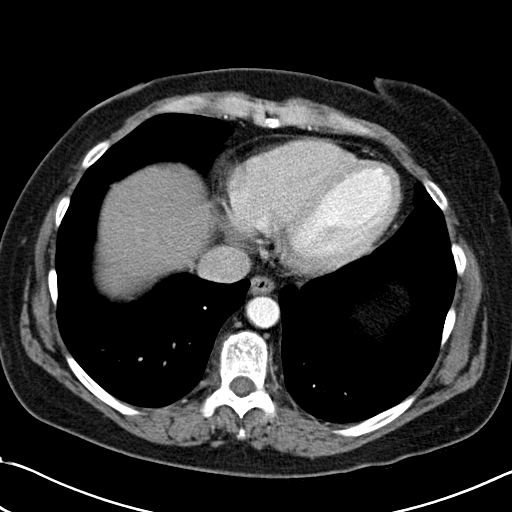
[im 23/64  lung]
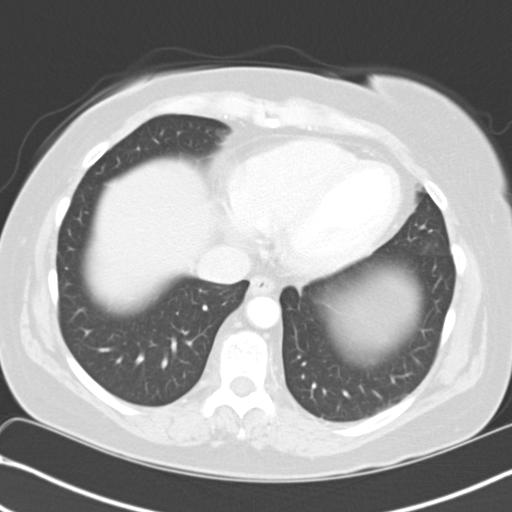
[im 28/64  lung]
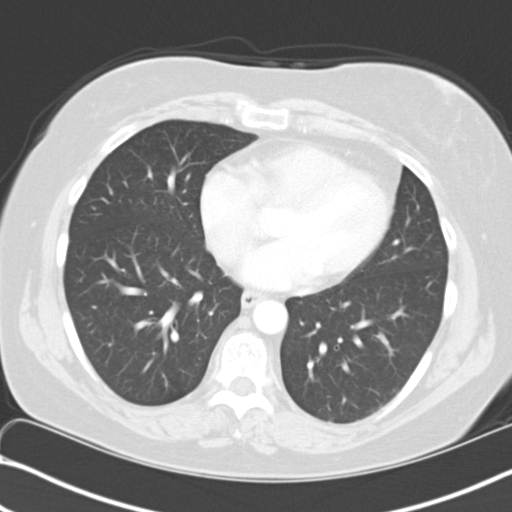
[im 37/64  lung]
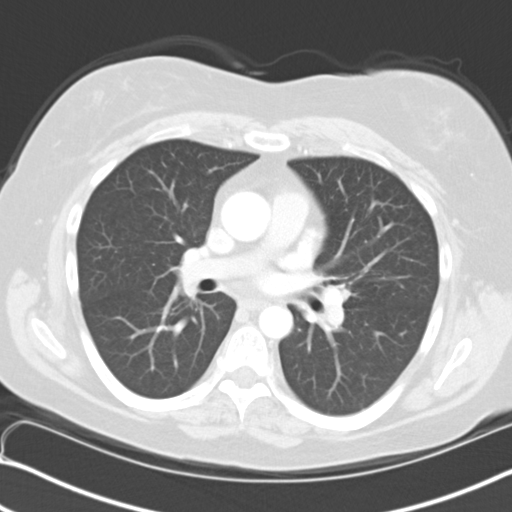
[im 41/64  lung]
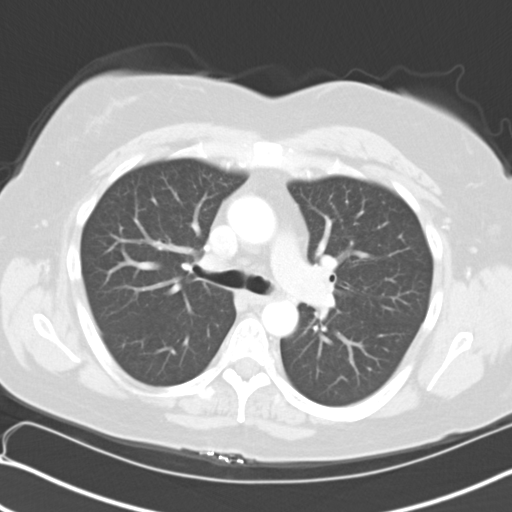
[im 46/64  mediastinal]
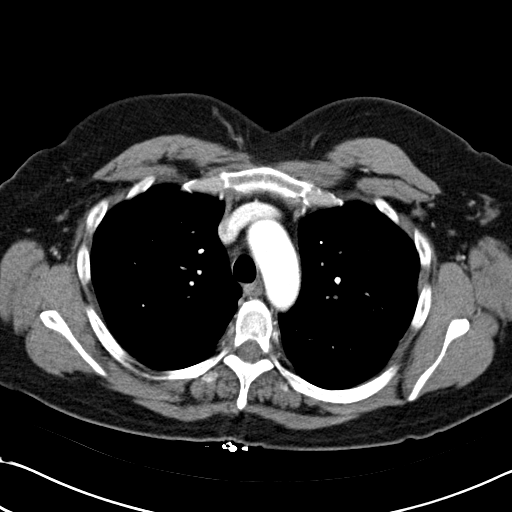
[im 46/64  lung]
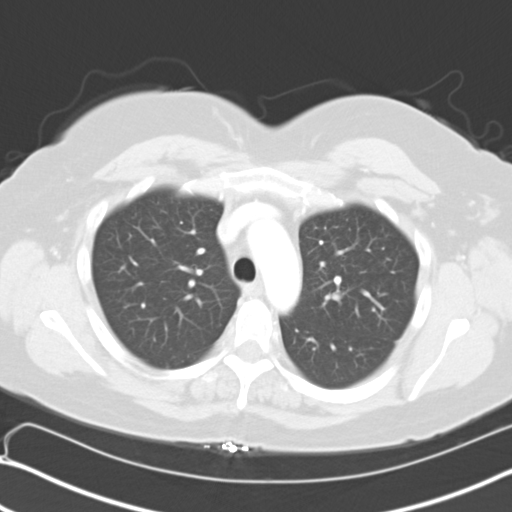
[im 50/64  lung]
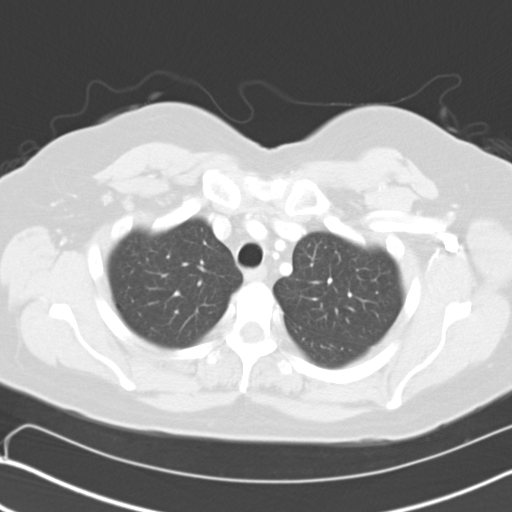
[im 55/64  lung]
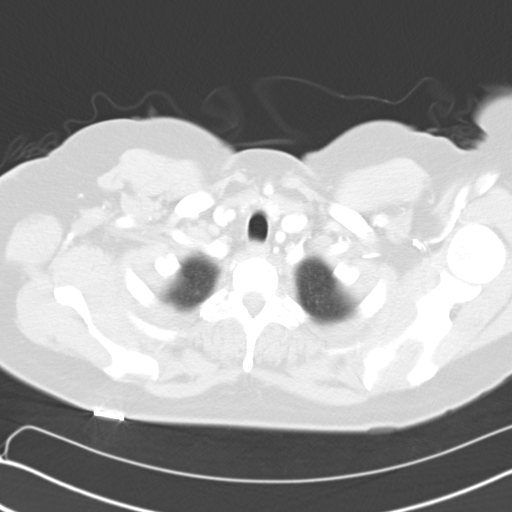
[im 59/64  lung]
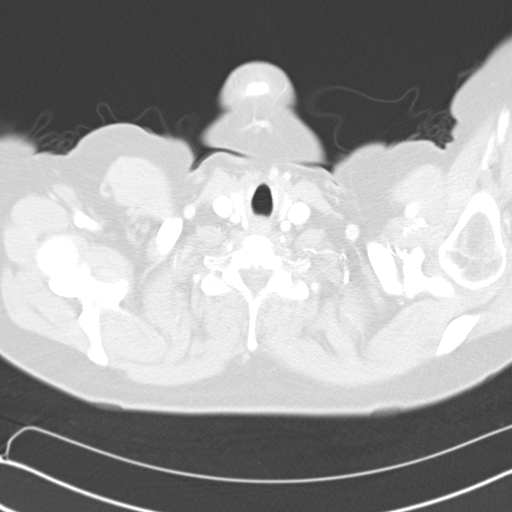

[Series 4: mpr coronal chest 3mm · coronal · 0.63mm/px · 3 of 98 slices shown]
[im 20/98  lung]
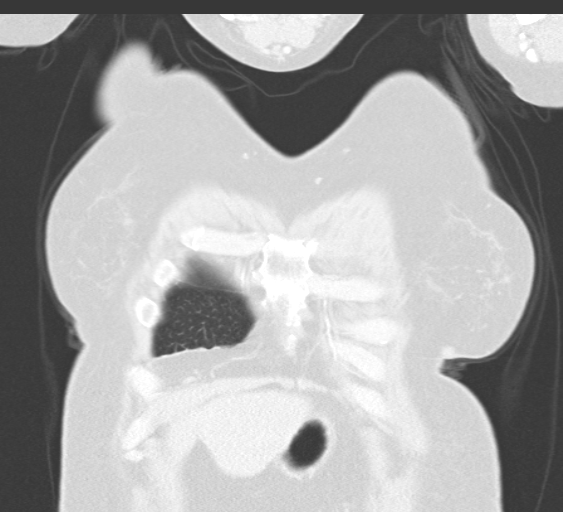
[im 39/98  lung]
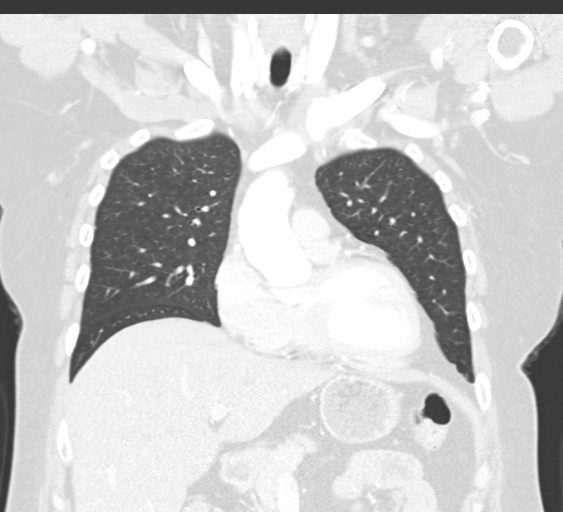
[im 59/98  lung]
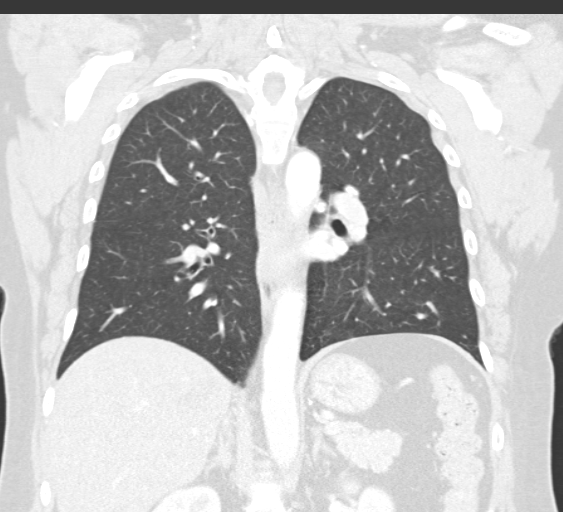

[15 of 36 positions shown; findings below may reference images not displayed]

FINDINGS: Mediastinum/Lymph Nodes: Heart size is normal. There is no
significant pericardial fluid, thickening or pericardial
calcification. No pathologically enlarged mediastinal or hilar lymph
nodes. Multiple calcified left hilar lymph nodes. Esophagus is
unremarkable in appearance. No axillary lymphadenopathy.

Lungs/Pleura: 6 mm left lower lobe pulmonary nodule (image 32 of
series 3). No other suspicious appearing pulmonary nodules or masses
are noted. No acute consolidative airspace disease. No pleural
effusions.

Upper Abdomen: Status post cholecystectomy.

Musculoskeletal/Soft Tissues: There are no aggressive appearing
lytic or blastic lesions noted in the visualized portions of the
skeleton.
IMPRESSION: 1. No acute findings in the thorax to account for the patient's
symptoms.
2. 6 mm pulmonary nodule in the lateral aspect of the left lower
lobe (image 32 of series 3). If the patient is at high risk for
bronchogenic carcinoma, follow-up chest CT at 6-12 months is
recommended. If the patient is at low risk for bronchogenic
carcinoma, follow-up chest CT at 12 months is recommended. This
recommendation follows the consensus statement: Guidelines for
Management of Small Pulmonary Nodules Detected on CT Scans: A
Statement from the [HOSPITAL] as published in Radiology

## 2018-12-15 DIAGNOSIS — K5792 Diverticulitis of intestine, part unspecified, without perforation or abscess without bleeding: Secondary | ICD-10-CM | POA: Insufficient documentation

## 2019-07-27 DIAGNOSIS — R748 Abnormal levels of other serum enzymes: Secondary | ICD-10-CM | POA: Insufficient documentation

## 2019-07-27 DIAGNOSIS — K5900 Constipation, unspecified: Secondary | ICD-10-CM | POA: Insufficient documentation

## 2020-02-25 DIAGNOSIS — E039 Hypothyroidism, unspecified: Secondary | ICD-10-CM

## 2020-02-25 DIAGNOSIS — R490 Dysphonia: Secondary | ICD-10-CM | POA: Insufficient documentation

## 2020-03-10 DIAGNOSIS — G4733 Obstructive sleep apnea (adult) (pediatric): Secondary | ICD-10-CM | POA: Insufficient documentation

## 2020-05-05 DIAGNOSIS — F17200 Nicotine dependence, unspecified, uncomplicated: Secondary | ICD-10-CM | POA: Insufficient documentation

## 2020-06-14 DIAGNOSIS — I10 Essential (primary) hypertension: Secondary | ICD-10-CM

## 2020-06-14 DIAGNOSIS — E669 Obesity, unspecified: Secondary | ICD-10-CM | POA: Insufficient documentation

## 2020-06-14 DIAGNOSIS — I83819 Varicose veins of unspecified lower extremities with pain: Secondary | ICD-10-CM | POA: Insufficient documentation

## 2020-06-14 DIAGNOSIS — I872 Venous insufficiency (chronic) (peripheral): Secondary | ICD-10-CM | POA: Insufficient documentation

## 2020-06-14 HISTORY — DX: Essential (primary) hypertension: I10

## 2020-08-01 DIAGNOSIS — K219 Gastro-esophageal reflux disease without esophagitis: Secondary | ICD-10-CM | POA: Insufficient documentation

## 2021-04-10 LAB — HM COLONOSCOPY

## 2021-10-09 DIAGNOSIS — H269 Unspecified cataract: Secondary | ICD-10-CM | POA: Insufficient documentation

## 2021-10-09 DIAGNOSIS — C801 Malignant (primary) neoplasm, unspecified: Secondary | ICD-10-CM | POA: Insufficient documentation

## 2022-01-21 DIAGNOSIS — M1990 Unspecified osteoarthritis, unspecified site: Secondary | ICD-10-CM | POA: Insufficient documentation

## 2022-01-21 DIAGNOSIS — K754 Autoimmune hepatitis: Secondary | ICD-10-CM | POA: Insufficient documentation

## 2022-08-01 ENCOUNTER — Other Ambulatory Visit: Payer: Self-pay

## 2022-08-01 ENCOUNTER — Ambulatory Visit
Admission: EM | Admit: 2022-08-01 | Discharge: 2022-08-01 | Disposition: A | Discharge status: Home / Self Care | Payer: Medicare Other

## 2022-08-01 ENCOUNTER — Ambulatory Visit: Payer: Medicare Other | Admitting: Family Medicine

## 2022-08-01 ENCOUNTER — Encounter: Payer: Self-pay | Admitting: Emergency Medicine

## 2022-08-01 DIAGNOSIS — Z760 Encounter for issue of repeat prescription: Secondary | ICD-10-CM | POA: Diagnosis not present

## 2022-08-01 DIAGNOSIS — M797 Fibromyalgia: Secondary | ICD-10-CM | POA: Diagnosis not present

## 2022-08-01 HISTORY — DX: Irritable bowel syndrome, unspecified: K58.9

## 2022-08-01 HISTORY — DX: Nonalcoholic steatohepatitis (NASH): K75.81

## 2022-08-01 HISTORY — DX: Chronic obstructive pulmonary disease, unspecified: J44.9

## 2022-08-01 HISTORY — DX: Fibromyalgia: M79.7

## 2022-08-01 HISTORY — DX: Unspecified osteoarthritis, unspecified site: M19.90

## 2022-08-01 MED ORDER — IBUPROFEN 800 MG PO TABS: 800.0000 mg | ORAL_TABLET | Freq: Three times a day (TID) | ORAL | 0 refills | Status: DC

## 2022-08-01 MED ORDER — KETOROLAC TROMETHAMINE 60 MG/2ML IM SOLN
30.0000 mg | Freq: Once | INTRAMUSCULAR | Status: AC
  Administered 2022-08-01: 30 mg via INTRAMUSCULAR

## 2022-08-01 NOTE — ED Provider Notes (Signed)
[facility]    CSN: [id number] Arrival date & time: 08/01/22  [id number]      History   Chief Complaint Chief Complaint  Patient presents with   Joint Pain    HPI Jennifer Rollins is a 52 y.o. female.   The history is provided by the patient.   Patient presents requesting a medication refill for oxycodone for history of fibromyalgia.  Patient states she has been out of the medication for several days, stating that she has recently moved to the area.  She denies fever, chills, chest pain, abdominal pain, nausea, vomiting, or diarrhea.  Patient states she did have an appointment with her primary care physician in this area, but the office called to reschedule the patient's appointment until next week.  Past Medical History:  Diagnosis Date   Abnormal Pap smear of cervix    Anemia    Anxiety    Arthritis    Chronic back pain    Chronic pelvic pain in female    COPD (chronic obstructive pulmonary disease) (HCC)    Depression    Fibromyalgia    HPV in female    IBS (irritable bowel syndrome)    NASH (nonalcoholic steatohepatitis)    PTSD (post-traumatic stress disorder)    Sciatica of right side     Patient Active Problem List   Diagnosis Date Noted   S/P total hysterectomy and bilateral salpingo-oophorectomy 09/15/2014   S/P total hysterectomy and BSO (bilateral salpingo-oophorectomy) 09/14/2014    Past Surgical History:  Procedure Laterality Date   ABDOMINAL HYSTERECTOMY N/A 09/14/2014   Procedure: HYSTERECTOMY ABDOMINAL;  Surgeon: Florian Buff, MD;  Location: [facility];  Service: Gynecology;  Laterality: N/A;   CESAREAN SECTION     CHOLECYSTECTOMY     COLPOSCOPY VULVA W/ BIOPSY     right forearm surgery Right    SALPINGOOPHORECTOMY Bilateral 09/14/2014   Procedure: SALPINGO OOPHORECTOMY;  Surgeon: Florian Buff, MD;  Location: [facility];  Service: Gynecology;  Laterality: Bilateral;   TUBAL LIGATION      OB History     Gravida  4   Para  2   Term  2    Preterm      AB  2   Living  2      SAB      IAB      Ectopic      Multiple      Live Births               Home Medications    Prior to Admission medications   Medication Sig Start Date End Date Taking? Authorizing Provider  ibuprofen (ADVIL) 800 MG tablet Take 1 tablet (800 mg total) by mouth 3 (three) times daily. 08/01/22  Yes Ita Fritzsche-Warren, Alda Lea, NP  oxyCODONE (OXY IR/ROXICODONE) 5 MG immediate release tablet Take 2.5 mg by mouth as needed for severe pain. 1.2 tablet, once daily as needed   Yes [provider]  benzonatate (TESSALON) 100 MG capsule Take 1 capsule (100 mg total) by mouth 3 (three) times daily as needed for cough. 09/30/15   Mesner, Corene Cornea, MD  citalopram (CELEXA) 40 MG tablet Take 40 mg by mouth daily.    [provider]  estradiol (ESTRACE) 2 MG tablet TAKE ONE TABLET BY MOUTH ONCE DAILY 09/22/15   Florian Buff, MD  fluconazole (DIFLUCAN) 200 MG tablet Take 1 tablet (200 mg total) by mouth daily. 09/30/15   Mesner, Corene Cornea, MD  traZODone (DESYREL) 100  MG tablet Take 100-200 mg by mouth at bedtime.     [provider]  dicyclomine (BENTYL) 20 MG tablet Take one every 6 hours for abd cramps Patient not taking: Reported on 09/30/2015 09/14/15 09/30/15  Milton Ferguson, MD    Family History Family History  Problem Relation Age of Onset   Diabetes Mother    Heart attack Mother    COPD Mother     Social History Social History   Tobacco Use   Smoking status: Every Day    Packs/day: 0.50    Years: 30.00    Total pack years: 15.00    Types: Cigarettes   Smokeless tobacco: Never  Substance Use Topics   Alcohol use: No   Drug use: Not Currently    Types: Marijuana    Comment: occassional use -last used "months ago".     Allergies   Flagyl [metronidazole] and Latex   Review of Systems Review of Systems Per HPI  Physical Exam Triage Vital Signs ED Triage Vitals  Enc Vitals Group     BP 08/01/22 0841 113/79      Pulse Rate 08/01/22 0841 94     Resp 08/01/22 0841 20     Temp 08/01/22 0841 97.8 F (36.6 C)     Temp Source 08/01/22 0841 Oral     SpO2 08/01/22 0841 96 %     Weight --      Height --      Head Circumference --      Peak Flow --      Pain Score 08/01/22 0836 8     Pain Loc --      Pain Edu? --      Excl. in [place]? --    No data found.  Updated Vital Signs BP 113/79 (BP Location: Right Arm)   Pulse 94   Temp 97.8 F (36.6 C) (Oral)   Resp 20   LMP 08/19/2014   SpO2 96%   Visual Acuity Right Eye Distance:   Left Eye Distance:   Bilateral Distance:    Right Eye Near:   Left Eye Near:    Bilateral Near:     Physical Exam Vitals and nursing note reviewed.  Constitutional:      General: She is not in acute distress.    Appearance: Normal appearance. She is well-developed.  HENT:     Head: Normocephalic.     Nose: Nose normal.     Mouth/Throat:     Mouth: Mucous membranes are moist.  Eyes:     Extraocular Movements: Extraocular movements intact.     Pupils: Pupils are equal, round, and reactive to light.  Cardiovascular:     Rate and Rhythm: Normal rate and regular rhythm.     Pulses: Normal pulses.     Heart sounds: Normal heart sounds.  Pulmonary:     Effort: Pulmonary effort is normal.     Breath sounds: Normal breath sounds.  Abdominal:     General: Bowel sounds are normal. There is no distension.     Palpations: Abdomen is soft.     Tenderness: There is no abdominal tenderness. There is no guarding or rebound.  Genitourinary:    Vagina: Normal. No vaginal discharge.  Musculoskeletal:     Cervical back: Normal range of motion.  Skin:    General: Skin is warm and dry.     Findings: No erythema or rash.  Neurological:     General: No focal deficit present.  Mental Status: She is alert and oriented to person, place, and time.     Cranial Nerves: No cranial nerve deficit.  Psychiatric:        Mood and Affect: Mood normal.        Behavior: Behavior  normal.      UC Treatments / Results  Labs (all labs ordered are listed, but only abnormal results are displayed) Labs Reviewed - No data to display  EKG   Radiology No results found.  Procedures Procedures (including critical care time)  Medications Ordered in UC Medications - No data to display  Initial Impression / Assessment and Plan / UC Course  I have reviewed the triage vital signs and the nursing notes.  Pertinent labs & imaging results that were available during my care of the patient were reviewed by me and considered in my medical decision making (see chart for details).  The patient is well-appearing, she is in no acute distress, vital signs are stable.  Patient requesting refill of oxycodone.  Advised patient that this provider does not feel comfortable providing prescription for oxycodone that would bridge her until next week.  Also unsure as to why patient is taking oxycodone for fibromyalgia, patient's previous provider is out of state.  Patient was offered Toradol 30 mg IM and to provide prescription for ibuprofen until she can be seen.  Patient was in agreement.  Patient was prescribed ibuprofen 800 mg to take every 8 hours.  Patient was advised to follow-up in the emergency department for worsening or more severe pain.  Patient is in agreement with this plan of care and verbalizes understanding.  All questions were answered.  Patient stable for discharge.  Final Clinical Impressions(s) / UC Diagnoses   Final diagnoses:  Fibromyalgia  Medication refill     Discharge Instructions      Take medication as prescribed. Increase fluids and allow for plenty of rest. If medication does not help with your pain, recommend following up in the emergency department for further evaluation. Follow-up as needed.     ED Prescriptions     Medication Sig Dispense Auth. Provider   ibuprofen (ADVIL) 800 MG tablet Take 1 tablet (800 mg total) by mouth 3 (three) times  daily. 21 tablet Amadu Schlageter-Warren, Alda Lea, NP      PDMP not reviewed this encounter.   Tish Men, NP 08/01/22 [phone]

## 2022-08-01 NOTE — ED Triage Notes (Signed)
Pt reports is out of fibromyalgia medication x2 days and reports was scheduled to see pcp this am for first visit and reports was called this morning and told appt was cancelled and rescheduled for next Tuesday.   Pt reports is out of oxycodone 5 mg and inquiring about refill.

## 2022-08-01 NOTE — Discharge Instructions (Signed)
Take medication as prescribed. Increase fluids and allow for plenty of rest. If medication does not help with your pain, recommend following up in the emergency department for further evaluation. Follow-up as needed.

## 2022-08-06 ENCOUNTER — Encounter: Payer: Self-pay | Admitting: Internal Medicine

## 2022-08-06 ENCOUNTER — Ambulatory Visit (HOSPITAL_COMMUNITY)
Admission: RE | Admit: 2022-08-06 | Discharge: 2022-08-06 | Disposition: A | Discharge status: Home / Self Care | Payer: Medicare Other | Source: Ambulatory Visit | Attending: Internal Medicine | Admitting: Internal Medicine

## 2022-08-06 ENCOUNTER — Ambulatory Visit (INDEPENDENT_AMBULATORY_CARE_PROVIDER_SITE_OTHER): Payer: Medicare Other | Admitting: Internal Medicine

## 2022-08-06 VITALS — BP 122/74 | HR 78 | Resp 16 | Ht 65.0 in | Wt 187.0 lb

## 2022-08-06 DIAGNOSIS — J449 Chronic obstructive pulmonary disease, unspecified: Secondary | ICD-10-CM | POA: Diagnosis not present

## 2022-08-06 DIAGNOSIS — G4700 Insomnia, unspecified: Secondary | ICD-10-CM

## 2022-08-06 DIAGNOSIS — M797 Fibromyalgia: Secondary | ICD-10-CM | POA: Diagnosis not present

## 2022-08-06 DIAGNOSIS — R748 Abnormal levels of other serum enzymes: Secondary | ICD-10-CM

## 2022-08-06 DIAGNOSIS — F3341 Major depressive disorder, recurrent, in partial remission: Secondary | ICD-10-CM

## 2022-08-06 DIAGNOSIS — Z0001 Encounter for general adult medical examination with abnormal findings: Secondary | ICD-10-CM | POA: Diagnosis not present

## 2022-08-06 DIAGNOSIS — M25531 Pain in right wrist: Secondary | ICD-10-CM | POA: Diagnosis present

## 2022-08-06 MED ORDER — ALBUTEROL SULFATE HFA 108 (90 BASE) MCG/ACT IN AERS: 2.0000 | INHALATION_SPRAY | Freq: Four times a day (QID) | RESPIRATORY_TRACT | 0 refills | Status: DC | PRN

## 2022-08-06 MED ORDER — GABAPENTIN 300 MG PO CAPS: 300.0000 mg | ORAL_CAPSULE | Freq: Three times a day (TID) | ORAL | 1 refills | Status: DC

## 2022-08-06 MED ORDER — TRAZODONE HCL 150 MG PO TABS: 150.0000 mg | ORAL_TABLET | Freq: Every day | ORAL | 3 refills | Status: DC

## 2022-08-06 MED ORDER — DULOXETINE HCL 60 MG PO CPEP: 60.0000 mg | ORAL_CAPSULE | Freq: Every day | ORAL | 3 refills | Status: DC

## 2022-08-06 MED ORDER — BREZTRI AEROSPHERE 160-9-4.8 MCG/ACT IN AERO: 2.0000 | INHALATION_SPRAY | Freq: Two times a day (BID) | RESPIRATORY_TRACT | 11 refills | Status: DC

## 2022-08-06 NOTE — Progress Notes (Unsigned)
Referring Provider: Lyndal Pulley, MD  Primary Care Physician:  Juanda Chance, [place] Primary Gastroenterologist:  Dr. Abbey Chatters  Chief Complaint  Patient presents with   Elevated Hepatic Enzymes    Blood work showed elevated enzymes.     HPI:   Jennifer Rollins is a 52 y.o. female presenting today at the request of Lyndal Pulley, MD for elevate liver enzymes. Referral states patient was undergoing workup in [place] but recently moved to [place].  History of liver biopsy recently.  Reviewed labs 08/06/2022: Alk phos elevated at 272  AST and ALT within normal limits.  Bilirubin within normal limits. Platelets elevated at 457. TSH normal. Cholesterol elevated. Vitamin D low at 22   Today: Patient reports alk phos has been elevated for 2 years. Previously up to 400 range. Was following with [facility] gastroenterology in [place].  States she underwent multiple laboratory tests, imaging test including an MRI of her abdomen, and liver biopsy last year with no definitive diagnosis.  States they mention cirrhosis to her.   ETOH: None. Occasional use previously.  Tylenol: Occasional. Not daily.  NSAIDs: Occasional.  Illicit drug use: Marijuana only.  Tattoos: Some home tattoos.  Hepatitis Exposures: None that she is aware of. OTC supplements/herbal teas: None.  Personal Hx of autoimmune conditions: None.  Fhx liver disease or autoimmune conditions: Maternal Grandfather with alcoholic cirrhosis.   No swelling in abdomen or LE, yellowing of eyes or skin, or mental status changes.   Reports tight sensation across the upper abdomen. Has been present for more than a year. Tender in the RUQ chronically. Some postprandial epigastric and LUQ abdominal pain for about 1 year. No real change.  Described as cramping. Last 30 minutes. Occurs with anything. Unintentional weight loss over the last year. Was 215 lbs, now 185 lbs today. Eats once a day. Gets full quickly. No  nausea or vomiting. Has a hiatal hernia. Occasional reflux. Not very often. 3-4 times a month. No dysphagia.   Borderline diabetes. Reports upper GI symptoms started after getting very sick 1 year ago with pneumonia, strep, collapsed lung, double ear infection/sinus infection.   Also with chronic constipation. Takes stool softener- 2 in the morning and 2 at night. Bowels moving well since starting gabapentin. Previously Linzess 290 prn. Worked well, but then it didn't. No brbpr or melena.   TCS- polyps on last colonoscopy 1 year ago or so. Was told to come back in 1 year. Not sure why. Maternal Grandfather with colon cancer.  EGD 1 year or so ago. Pain in the upper abdomen. Not sure of result.   Has chronic fatigue. Also with dry skin.   Chronic fatigue. Dry skin. Hasn't started Vitamin D.     Past Medical History:  Diagnosis Date   Abnormal Pap smear of cervix    Allergy    Anemia    Anxiety    Arthritis    Cancer ([place]) 2016   Cervical   Cataract 2022   Chronic back pain    Chronic pelvic pain in female    COPD (chronic obstructive pulmonary disease) (HCC)    Depression    Fibromyalgia    GERD (gastroesophageal reflux disease) 1990   HPV in female    IBS (irritable bowel syndrome)    NASH (nonalcoholic steatohepatitis)    PTSD (post-traumatic stress disorder)    Sciatica of right side    Sleep apnea 2022    Past Surgical History:  Procedure Laterality Date  ABDOMINAL HYSTERECTOMY N/A 09/14/2014   Procedure: HYSTERECTOMY ABDOMINAL;  Surgeon: Florian Buff, MD;  Location: [facility];  Service: Gynecology;  Laterality: N/A;   CESAREAN SECTION     CHOLECYSTECTOMY     COLPOSCOPY VULVA W/ BIOPSY     FRACTURE SURGERY  2022   Right forearm   right forearm surgery Right    SALPINGOOPHORECTOMY Bilateral 09/14/2014   Procedure: SALPINGO OOPHORECTOMY;  Surgeon: Florian Buff, MD;  Location: [facility];  Service: Gynecology;  Laterality: Bilateral;   TUBAL LIGATION      Current  Outpatient Medications  Medication Sig Dispense Refill   albuterol (VENTOLIN HFA) 108 (90 Base) MCG/ACT inhaler Inhale 2 puffs into the lungs every 6 (six) hours as needed for wheezing or shortness of breath. 8 g 0   Budeson-Glycopyrrol-Formoterol (BREZTRI AEROSPHERE) 160-9-4.8 MCG/ACT AERO Inhale 2 puffs into the lungs 2 (two) times daily. 10.7 g 11   Docusate Sodium (DSS) 100 MG CAPS TAKE 1 CAPSULE BY MOUTH TWICE DAILY AS NEEDED FOR CONSTIPATION     DULoxetine (CYMBALTA) 60 MG capsule Take 1 capsule (60 mg total) by mouth daily. 90 capsule 3   gabapentin (NEURONTIN) 300 MG capsule Take 1 capsule (300 mg total) by mouth 3 (three) times daily. 270 capsule 1   traZODone (DESYREL) 150 MG tablet Take 1-2 tablets (150-300 mg total) by mouth at bedtime. 90 tablet 3   Cholecalciferol (VITAMIN D3) 20 MCG (800 UNIT) TABS Take 1 tablet by mouth daily. (Patient not taking: Reported on 08/08/2022) 75 tablet 1   No current facility-administered medications for this visit.    Allergies as of 08/08/2022 - Review Complete 08/08/2022  Allergen Reaction Noted   Flagyl [metronidazole] Itching 01/15/2015   Latex Other (See Comments) 02/08/2013   Lithium  08/01/2022    Family History  Problem Relation Age of Onset   Diabetes Mother    Heart attack Mother    COPD Mother    Anxiety disorder Mother    Arthritis Mother    Cancer Mother    Depression Mother    Hearing loss Mother    Kidney disease Mother    60 / [place] Mother    Alcohol abuse Father    ADD / ADHD Brother    Depression Brother    Drug abuse Brother    Cirrhosis Maternal Grandfather        ETOH   Colon cancer Maternal Grandfather    Learning disabilities Son     Social History   Socioeconomic History   Marital status: Married    Spouse name: Not on file   Number of children: Not on file   Years of education: Not on file   Highest education level: Not on file  Occupational History   Not on file  Tobacco Use    Smoking status: Every Day    Packs/day: 1.50    Years: 30.00    Additional pack years: 0.00    Total pack years: 45.00    Types: Cigarettes   Smokeless tobacco: Never  Substance and Sexual Activity   Alcohol use: Not Currently    Comment: Never a heavy drinker   Drug use: Never    Types: Marijuana    Comment: occassional use -last used "months ago".   Sexual activity: Yes    Birth control/protection: Surgical  Other Topics Concern   Not on file  Social History Narrative   Not on file   Social Determinants of Health   Financial Resource Strain:  Not on file  Food Insecurity: Not on file  Transportation Needs: Not on file  Physical Activity: Not on file  Stress: Not on file  Social Connections: Not on file  Intimate Partner Violence: Not on file    Review of Systems: Gen: Denies any fever, chills, cold or flulike symptoms, presyncope, syncope. CV: Denies chest pain, heart palpitations. Resp: Denies shortness of breath, cough. GI: See HPI GU : Denies urinary burning, urinary frequency, urinary hesitancy MS: Denies joint pain. Derm: Denies rash. Psych: Denies depression, anxiety. Heme: See HPI  Physical Exam: BP 113/75 (BP Location: Right Arm, Patient Position: Sitting, Cuff Size: Normal)   Pulse 82   Temp (!) 97.4 F (36.3 C) (Temporal)   Ht '5\' 5"'$  (1.651 m)   Wt 185 lb 12.8 oz (84.3 kg)   LMP 08/19/2014   SpO2 100%   BMI 30.92 kg/m  General:   Alert and oriented. Pleasant and cooperative. Well-nourished and well-developed.  Head:  Normocephalic and atraumatic. Eyes:  Without icterus, sclera clear and conjunctiva pink.  Ears:  Normal auditory acuity. Lungs:  Clear to auscultation bilaterally. No wheezes, rales, or rhonchi. No distress.  Heart:  S1, S2 present without murmurs appreciated.  Abdomen:  +BS, soft, non-tender and non-distended. No HSM noted. No guarding or rebound. No masses appreciated.  Rectal:  Deferred  Msk:  Symmetrical without gross  deformities. Normal posture. Extremities:  Without edema. Neurologic:  Alert and  oriented x4;  grossly normal neurologically. Skin:  Intact without significant lesions or rashes. Psych:  Normal mood and affect.    Assessment:  52 year old female with history of anxiety, arthritis, cervical cancer, COPD, depression, fibromyalgia, IBS, GERD, presenting today for further evaluation of elevated alkaline phosphatase.  Elevated alkaline phosphatase: Patient reports 2-year history of elevated alkaline phosphatase with extensive evaluation including multiple labs, abdominal imaging including MRI, and liver biopsy last year with [facility] in [place].  She states there has been no definitive diagnosis.  She is not currently on any medications for elevated alkaline phosphatase.  States there was mention of cirrhosis, but she is not sure about this.  Clinically, she has no symptoms of decompensated liver disease.  Most recent labs 08/06/2022 with alkaline phosphatase elevated at 272, AST, ALT, bilirubin within normal limits.  Platelets elevated.    At this time, etiology is not clear. We will request records from [facility] for review.  Additional recommendations thereafter.  Weight loss: Patient reports unintentional 30 pound weight loss over the last year.  Possibly secondary to upper GI symptoms of postprandial upper abdominal pain for the last year and early satiety discussed below. She also reports history of colon polyps and states she is due for colonoscopy at this time.  Reports her last colonoscopy was last year, but was advised to repeat in 1 year.  No significant lower GI symptoms.   To further evaluate her weight loss and upper GI symptoms, I have recommended an EGD.  I also recommended updating colonoscopy as well due to significant weight loss and history of colon polyps.  Upper abdominal pain/early satiety: Chronic tenderness in the right upper  quadrant, but over the last year has had postprandial epigastric and LUQ abdominal pain, associated early satiety without nausea or vomiting.  Rare reflux.  History of cholecystectomy.  Recent labs completed 08/06/2022 with alk phos elevated at 272 which is chronic, AST, ALT, bilirubin, hemoglobin within normal limits.   Etiology is unclear.  Differentials include gastritis, duodenitis, PUD, H. pylori,  gastroparesis.  Notably, patient reports her upper GI symptoms started after having significant illness 1 year ago.   History of colon polyps: Patient reports history of colon polyps with polyps on her colonoscopy last year in [place] with recommendations to repeat in 1 year.  States she has received notification that she is due for colonoscopy.  We are requesting records for review; however, due to significant unintentional weight loss discussed above, we are going to go ahead and update her colonoscopy at this time.   Hyperlipidemia: PCP recently wanted to start patient on statin medication which I am agreeable with.   Hyperkalemia: Potassium 5.9 on labs 08/06/2022.  PCP was going to have [place] recheck potassium if we were drawing labs today, but no labs are being ordered.  Advised patient to follow-up with PCP on this.    Plan:  Proceed with EGD and colonoscopy with propofol by Dr. Abbey Chatters in near future. The risks, benefits, and alternatives have been discussed with the patient in detail. The patient states understanding and desires to proceed.  ASA 3 Request records including labs, abdominal imaging, liver biopsy, colonoscopy, EGD from [facility] in [place].  Further recommendations to follow. No more than 2000 mg of tylenol daily.  Okay to start statin medication for hyperlipidemia. Follow-up with PCP on hyperkalemia. Follow-up in our office after procedures.   Jennifer Altes, PA-C [facility] 08/08/2022

## 2022-08-06 NOTE — Progress Notes (Addendum)
HPI:JenniferJennifer Rollins is a 52 y.o. female who presents for evaluation of wrist pain and to establish care. For the details of today's visit, please refer to the assessment and plan.   Past Medical History:  Diagnosis Date   Abnormal Pap smear of cervix    Allergy    Anemia    Anxiety    Arthritis    Cancer (HCC) 2016   Cervical   Cataract 2022   Chronic back pain    Chronic pelvic pain in female    COPD (chronic obstructive pulmonary disease) (HCC)    Depression    Fibromyalgia    GERD (gastroesophageal reflux disease) 1990   HPV in female    IBS (irritable bowel syndrome)    NASH (nonalcoholic steatohepatitis)    PTSD (post-traumatic stress disorder)    Sciatica of right side    Sleep apnea 2022    Past Surgical History:  Procedure Laterality Date   ABDOMINAL HYSTERECTOMY N/A 09/14/2014   Procedure: HYSTERECTOMY ABDOMINAL;  Surgeon: Lazaro Arms, MD;  Location: [facility];  Service: Gynecology;  Laterality: N/A;   CESAREAN SECTION     CHOLECYSTECTOMY     COLPOSCOPY VULVA W/ BIOPSY     FRACTURE SURGERY  2022   Right forearm   right forearm surgery Right    SALPINGOOPHORECTOMY Bilateral 09/14/2014   Procedure: SALPINGO OOPHORECTOMY;  Surgeon: Lazaro Arms, MD;  Location: [facility];  Service: Gynecology;  Laterality: Bilateral;   TUBAL LIGATION      Family History  Problem Relation Age of Onset   Diabetes Mother    Heart attack Mother    COPD Mother    Anxiety disorder Mother    Arthritis Mother    Cancer Mother    Depression Mother    Hearing loss Mother    Kidney disease Mother    Miscarriages / [place] Mother    Alcohol abuse Father    ADD / ADHD Brother    Depression Brother    Drug abuse Brother    Learning disabilities Son     Social History   Tobacco Use   Smoking status: Every Day    Packs/day: 1.50    Years: 30.00    Total pack years: 45.00    Types: Cigarettes   Smokeless tobacco: Never  Substance Use Topics   Alcohol use: Never    Drug use: Never    Types: Marijuana    Comment: occassional use -last used "months ago".    Review of Systems:    ROS   Physical Exam: Vitals:   08/06/22 0808  BP: 122/74  Pulse: 78  Resp: 16  SpO2: 97%  Weight: 187 lb (84.8 kg)  Height: 5\' 5"  (1.651 m)     Physical Exam Constitutional:      General: She is not in acute distress.    Appearance: She is not ill-appearing.  Cardiovascular:     Rate and Rhythm: Normal rate and regular rhythm.  Pulmonary:     Effort: Pulmonary effort is normal. No respiratory distress.     Breath sounds: No wheezing.  Musculoskeletal:        General: Tenderness (tenderness to palpation of left distal arm over scar) present. No swelling.  Skin:    Findings: No erythema.     Comments: Macule on extensor side of distal forearm. Scar over flexor side of distal arm      Assessment & Plan:   Chronic obstructive pulmonary disease, unspecified COPD  type [facility]) Assessment & Plan: Patient is a current everyday smoker with 45 pack years.  Has history of COPD, no PFTs in our system. Currently on Breztri and as needed albuterol.  Orders: -     Breztri Aerosphere; Inhale 2 puffs into the lungs 2 (two) times daily.  Dispense: 10.7 g; Refill: 11 -     Albuterol Sulfate HFA; Inhale 2 puffs into the lungs every 6 (six) hours as needed for wheezing or shortness of breath.  Dispense: 8 g; Refill: 0  Encounter for general adult medical examination with abnormal findings Assessment & Plan: Patient here to establish care in our clinic. She was previously followed in [place].  Will obtain baseline labs today.   Orders: -     TSH -     CBC with Differential/Platelet -     CMP14+EGFR -     Lipid panel -     VITAMIN D 25 Hydroxy (Vit-D Deficiency, Fractures) -     Hemoglobin A1c  Right wrist pain Assessment & Plan: Patient presents with right wrist pain after having a box fall on her right arm.  She estimates approximately 150 pounds.  She has  history of right forearm surgery. Will send patient for x-ray of right wrist.   Orders: -     DG Wrist Complete Right; Future  Fibromyalgia Assessment & Plan: Chronic problem. Recently went to urgent care for refill of oxycodone and given toradol shot.. Toradol helped with pain.  Patient takes Cymbalta which works well for her depression, but does not control her chronic pain.  She has also been prescribed oxycodone in [place], cannot review this in [place]. She has not taken in the last month.  Discussed I do not recommend this is a treatment for fibromyalgia.  We will start gabapentin today.   Orders: -     DULoxetine HCl; Take 1 capsule (60 mg total) by mouth daily.  Dispense: 90 capsule; Refill: 3 -     Gabapentin; Take 1 capsule (300 mg total) by mouth 3 (three) times daily.  Dispense: 270 capsule; Refill: 1  Elevated liver enzymes Assessment & Plan: Patient reports a history of elevated liver enzymes.  She was undergoing a workup with a gastroenterologist in [place].  She mention she had a liver biopsy and it was unclear of her diagnosis.  She was told she possibly had hepatitis but then it was ruled out.  Plan to have patient sign for records today and will refer patient to GI.   Orders: -     Ambulatory referral to Gastroenterology  Insomnia, unspecified type Assessment & Plan: Chronic problem Refilled Trazadone  Orders: -     traZODone HCl; Take 1-2 tablets (150-300 mg total) by mouth at bedtime.  Dispense: 90 tablet; Refill: 3  MDD (major depressive disorder), recurrent, in partial remission [facility]) Assessment & Plan: Patient has PHQ-9 of 17 today, but reports her depression is much better controlled today than normal on Cymbalta.  She has also diagnosed with fibromyalgia and deals with chronic pain which is making her feel worse today.  Refilled Cymbalta   Orders: -     DULoxetine HCl; Take 1 capsule (60 mg total) by mouth daily.  Dispense: 90 capsule; Refill:  3      Milus Banister, MD

## 2022-08-06 NOTE — Patient Instructions (Signed)
Thank you, Ms.Anqi Robicheaux for allowing [place] to provide your care today.   I have ordered the following labs for you:   Lab Orders         TSH         CBC with Differential/Platelet         CMP14+EGFR         Lipid panel         VITAMIN D 25 Hydroxy (Vit-D Deficiency, Fractures)         Hemoglobin A1c         Reminders: Start Gabapentin. Follow up in 3 months or sooner if this medication is not helping with symptoms. We can refer you to pain management if you need to take oxycodone to control pain. Go to [facility] for xray and I will follow up with results. Go by the lab and I will follow up with results. Please sign form for your records from [place]. I have referred you to GI for your Liver disease.     Tamsen Snider, M.D.

## 2022-08-06 NOTE — H&P (View-Only) (Signed)
Referring Provider: Lyndal Pulley, MD  Primary Care Physician:  Juanda Chance, [place] Primary Gastroenterologist:  Dr. Abbey Chatters  Chief Complaint  Patient presents with   Elevated Hepatic Enzymes    Blood work showed elevated enzymes.     HPI:   Jennifer Rollins is a 52 y.o. female presenting today at the request of Lyndal Pulley, MD for elevate liver enzymes. Referral states patient was undergoing workup in [place] but recently moved to [place].  History of liver biopsy recently.  Reviewed labs 08/06/2022: Alk phos elevated at 272  AST and ALT within normal limits.  Bilirubin within normal limits. Platelets elevated at 457. TSH normal. Cholesterol elevated. Vitamin D low at 22   Today: Patient reports alk phos has been elevated for 2 years. Previously up to 400 range. Was following with [facility] gastroenterology in [place].  States she underwent multiple laboratory tests, imaging test including an MRI of her abdomen, and liver biopsy last year with no definitive diagnosis.  States they mention cirrhosis to her.   ETOH: None. Occasional use previously.  Tylenol: Occasional. Not daily.  NSAIDs: Occasional.  Illicit drug use: Marijuana only.  Tattoos: Some home tattoos.  Hepatitis Exposures: None that she is aware of. OTC supplements/herbal teas: None.  Personal Hx of autoimmune conditions: None.  Fhx liver disease or autoimmune conditions: Maternal Grandfather with alcoholic cirrhosis.   No swelling in abdomen or LE, yellowing of eyes or skin, or mental status changes.   Reports tight sensation across the upper abdomen. Has been present for more than a year. Tender in the RUQ chronically. Some postprandial epigastric and LUQ abdominal pain for about 1 year. No real change.  Described as cramping. Last 30 minutes. Occurs with anything. Unintentional weight loss over the last year. Was 215 lbs, now 185 lbs today. Eats once a day. Gets full quickly. No  nausea or vomiting. Has a hiatal hernia. Occasional reflux. Not very often. 3-4 times a month. No dysphagia.   Borderline diabetes. Reports upper GI symptoms started after getting very sick 1 year ago with pneumonia, strep, collapsed lung, double ear infection/sinus infection.   Also with chronic constipation. Takes stool softener- 2 in the morning and 2 at night. Bowels moving well since starting gabapentin. Previously Linzess 290 prn. Worked well, but then it didn't. No brbpr or melena.   TCS- polyps on last colonoscopy 1 year ago or so. Was told to come back in 1 year. Not sure why. Maternal Grandfather with colon cancer.  EGD 1 year or so ago. Pain in the upper abdomen. Not sure of result.   Has chronic fatigue. Also with dry skin.   Chronic fatigue. Dry skin. Hasn't started Vitamin D.     Past Medical History:  Diagnosis Date   Abnormal Pap smear of cervix    Allergy    Anemia    Anxiety    Arthritis    Cancer ([place]) 2016   Cervical   Cataract 2022   Chronic back pain    Chronic pelvic pain in female    COPD (chronic obstructive pulmonary disease) (HCC)    Depression    Fibromyalgia    GERD (gastroesophageal reflux disease) 1990   HPV in female    IBS (irritable bowel syndrome)    NASH (nonalcoholic steatohepatitis)    PTSD (post-traumatic stress disorder)    Sciatica of right side    Sleep apnea 2022    Past Surgical History:  Procedure Laterality Date  ABDOMINAL HYSTERECTOMY N/A 09/14/2014   Procedure: HYSTERECTOMY ABDOMINAL;  Surgeon: Florian Buff, MD;  Location: [facility];  Service: Gynecology;  Laterality: N/A;   CESAREAN SECTION     CHOLECYSTECTOMY     COLPOSCOPY VULVA W/ BIOPSY     FRACTURE SURGERY  2022   Right forearm   right forearm surgery Right    SALPINGOOPHORECTOMY Bilateral 09/14/2014   Procedure: SALPINGO OOPHORECTOMY;  Surgeon: Florian Buff, MD;  Location: [facility];  Service: Gynecology;  Laterality: Bilateral;   TUBAL LIGATION      Current  Outpatient Medications  Medication Sig Dispense Refill   albuterol (VENTOLIN HFA) 108 (90 Base) MCG/ACT inhaler Inhale 2 puffs into the lungs every 6 (six) hours as needed for wheezing or shortness of breath. 8 g 0   Budeson-Glycopyrrol-Formoterol (BREZTRI AEROSPHERE) 160-9-4.8 MCG/ACT AERO Inhale 2 puffs into the lungs 2 (two) times daily. 10.7 g 11   Docusate Sodium (DSS) 100 MG CAPS TAKE 1 CAPSULE BY MOUTH TWICE DAILY AS NEEDED FOR CONSTIPATION     DULoxetine (CYMBALTA) 60 MG capsule Take 1 capsule (60 mg total) by mouth daily. 90 capsule 3   gabapentin (NEURONTIN) 300 MG capsule Take 1 capsule (300 mg total) by mouth 3 (three) times daily. 270 capsule 1   traZODone (DESYREL) 150 MG tablet Take 1-2 tablets (150-300 mg total) by mouth at bedtime. 90 tablet 3   Cholecalciferol (VITAMIN D3) 20 MCG (800 UNIT) TABS Take 1 tablet by mouth daily. (Patient not taking: Reported on 08/08/2022) 75 tablet 1   No current facility-administered medications for this visit.    Allergies as of 08/08/2022 - Review Complete 08/08/2022  Allergen Reaction Noted   Flagyl [metronidazole] Itching 01/15/2015   Latex Other (See Comments) 02/08/2013   Lithium  08/01/2022    Family History  Problem Relation Age of Onset   Diabetes Mother    Heart attack Mother    COPD Mother    Anxiety disorder Mother    Arthritis Mother    Cancer Mother    Depression Mother    Hearing loss Mother    Kidney disease Mother    60 / [place] Mother    Alcohol abuse Father    ADD / ADHD Brother    Depression Brother    Drug abuse Brother    Cirrhosis Maternal Grandfather        ETOH   Colon cancer Maternal Grandfather    Learning disabilities Son     Social History   Socioeconomic History   Marital status: Married    Spouse name: Not on file   Number of children: Not on file   Years of education: Not on file   Highest education level: Not on file  Occupational History   Not on file  Tobacco Use    Smoking status: Every Day    Packs/day: 1.50    Years: 30.00    Additional pack years: 0.00    Total pack years: 45.00    Types: Cigarettes   Smokeless tobacco: Never  Substance and Sexual Activity   Alcohol use: Not Currently    Comment: Never a heavy drinker   Drug use: Never    Types: Marijuana    Comment: occassional use -last used "months ago".   Sexual activity: Yes    Birth control/protection: Surgical  Other Topics Concern   Not on file  Social History Narrative   Not on file   Social Determinants of Health   Financial Resource Strain:  Not on file  Food Insecurity: Not on file  Transportation Needs: Not on file  Physical Activity: Not on file  Stress: Not on file  Social Connections: Not on file  Intimate Partner Violence: Not on file    Review of Systems: Gen: Denies any fever, chills, cold or flulike symptoms, presyncope, syncope. CV: Denies chest pain, heart palpitations. Resp: Denies shortness of breath, cough. GI: See HPI GU : Denies urinary burning, urinary frequency, urinary hesitancy MS: Denies joint pain. Derm: Denies rash. Psych: Denies depression, anxiety. Heme: See HPI  Physical Exam: BP 113/75 (BP Location: Right Arm, Patient Position: Sitting, Cuff Size: Normal)   Pulse 82   Temp (!) 97.4 F (36.3 C) (Temporal)   Ht '5\' 5"'$  (1.651 m)   Wt 185 lb 12.8 oz (84.3 kg)   LMP 08/19/2014   SpO2 100%   BMI 30.92 kg/m  General:   Alert and oriented. Pleasant and cooperative. Well-nourished and well-developed.  Head:  Normocephalic and atraumatic. Eyes:  Without icterus, sclera clear and conjunctiva pink.  Ears:  Normal auditory acuity. Lungs:  Clear to auscultation bilaterally. No wheezes, rales, or rhonchi. No distress.  Heart:  S1, S2 present without murmurs appreciated.  Abdomen:  +BS, soft, non-tender and non-distended. No HSM noted. No guarding or rebound. No masses appreciated.  Rectal:  Deferred  Msk:  Symmetrical without gross  deformities. Normal posture. Extremities:  Without edema. Neurologic:  Alert and  oriented x4;  grossly normal neurologically. Skin:  Intact without significant lesions or rashes. Psych:  Normal mood and affect.    Assessment:  52 year old female with history of anxiety, arthritis, cervical cancer, COPD, depression, fibromyalgia, IBS, GERD, presenting today for further evaluation of elevated alkaline phosphatase.  Elevated alkaline phosphatase: Patient reports 2-year history of elevated alkaline phosphatase with extensive evaluation including multiple labs, abdominal imaging including MRI, and liver biopsy last year with [facility] in [place].  She states there has been no definitive diagnosis.  She is not currently on any medications for elevated alkaline phosphatase.  States there was mention of cirrhosis, but she is not sure about this.  Clinically, she has no symptoms of decompensated liver disease.  Most recent labs 08/06/2022 with alkaline phosphatase elevated at 272, AST, ALT, bilirubin within normal limits.  Platelets elevated.    At this time, etiology is not clear. We will request records from [facility] for review.  Additional recommendations thereafter.  Weight loss: Patient reports unintentional 30 pound weight loss over the last year.  Possibly secondary to upper GI symptoms of postprandial upper abdominal pain for the last year and early satiety discussed below. She also reports history of colon polyps and states she is due for colonoscopy at this time.  Reports her last colonoscopy was last year, but was advised to repeat in 1 year.  No significant lower GI symptoms.   To further evaluate her weight loss and upper GI symptoms, I have recommended an EGD.  I also recommended updating colonoscopy as well due to significant weight loss and history of colon polyps.  Upper abdominal pain/early satiety: Chronic tenderness in the right upper  quadrant, but over the last year has had postprandial epigastric and LUQ abdominal pain, associated early satiety without nausea or vomiting.  Rare reflux.  History of cholecystectomy.  Recent labs completed 08/06/2022 with alk phos elevated at 272 which is chronic, AST, ALT, bilirubin, hemoglobin within normal limits.   Etiology is unclear.  Differentials include gastritis, duodenitis, PUD, H. pylori,  gastroparesis.  Notably, patient reports her upper GI symptoms started after having significant illness 1 year ago.   History of colon polyps: Patient reports history of colon polyps with polyps on her colonoscopy last year in [place] with recommendations to repeat in 1 year.  States she has received notification that she is due for colonoscopy.  We are requesting records for review; however, due to significant unintentional weight loss discussed above, we are going to go ahead and update her colonoscopy at this time.   Hyperlipidemia: PCP recently wanted to start patient on statin medication which I am agreeable with.   Hyperkalemia: Potassium 5.9 on labs 08/06/2022.  PCP was going to have [place] recheck potassium if we were drawing labs today, but no labs are being ordered.  Advised patient to follow-up with PCP on this.    Plan:  Proceed with EGD and colonoscopy with propofol by Dr. Abbey Chatters in near future. The risks, benefits, and alternatives have been discussed with the patient in detail. The patient states understanding and desires to proceed.  ASA 3 Request records including labs, abdominal imaging, liver biopsy, colonoscopy, EGD from [facility] in [place].  Further recommendations to follow. No more than 2000 mg of tylenol daily.  Okay to start statin medication for hyperlipidemia. Follow-up with PCP on hyperkalemia. Follow-up in our office after procedures.   Aliene Altes, PA-C [facility] 08/08/2022

## 2022-08-07 ENCOUNTER — Other Ambulatory Visit: Payer: Self-pay | Admitting: Internal Medicine

## 2022-08-07 DIAGNOSIS — M797 Fibromyalgia: Secondary | ICD-10-CM | POA: Insufficient documentation

## 2022-08-07 DIAGNOSIS — G4700 Insomnia, unspecified: Secondary | ICD-10-CM | POA: Insufficient documentation

## 2022-08-07 DIAGNOSIS — F3341 Major depressive disorder, recurrent, in partial remission: Secondary | ICD-10-CM | POA: Insufficient documentation

## 2022-08-07 DIAGNOSIS — J449 Chronic obstructive pulmonary disease, unspecified: Secondary | ICD-10-CM | POA: Insufficient documentation

## 2022-08-07 DIAGNOSIS — R748 Abnormal levels of other serum enzymes: Secondary | ICD-10-CM | POA: Insufficient documentation

## 2022-08-07 DIAGNOSIS — M25531 Pain in right wrist: Secondary | ICD-10-CM | POA: Insufficient documentation

## 2022-08-07 DIAGNOSIS — Z0001 Encounter for general adult medical examination with abnormal findings: Secondary | ICD-10-CM | POA: Insufficient documentation

## 2022-08-07 LAB — CBC WITH DIFFERENTIAL/PLATELET
Basophils Absolute: 0.1 10*3/uL (ref 0.0–0.2)
Basos: 1 %
EOS (ABSOLUTE): 0.1 10*3/uL (ref 0.0–0.4)
Eos: 1 %
Hematocrit: 46.6 % (ref 34.0–46.6)
Hemoglobin: 14.9 g/dL (ref 11.1–15.9)
Immature Grans (Abs): 0 10*3/uL (ref 0.0–0.1)
Immature Granulocytes: 0 %
Lymphocytes Absolute: 2 10*3/uL (ref 0.7–3.1)
Lymphs: 25 %
MCH: 28 pg (ref 26.6–33.0)
MCHC: 32 g/dL (ref 31.5–35.7)
MCV: 88 fL (ref 79–97)
Monocytes Absolute: 0.8 10*3/uL (ref 0.1–0.9)
Monocytes: 10 %
Neutrophils Absolute: 5.1 10*3/uL (ref 1.4–7.0)
Neutrophils: 63 %
Platelets: 457 10*3/uL — ABNORMAL HIGH (ref 150–450)
RBC: 5.32 x10E6/uL — ABNORMAL HIGH (ref 3.77–5.28)
RDW: 13 % (ref 11.7–15.4)
WBC: 8.1 10*3/uL (ref 3.4–10.8)

## 2022-08-07 LAB — CMP14+EGFR
ALT: 16 IU/L (ref 0–32)
AST: 20 IU/L (ref 0–40)
Albumin/Globulin Ratio: 1.3 (ref 1.2–2.2)
Albumin: 4.2 g/dL (ref 3.8–4.9)
Alkaline Phosphatase: 272 IU/L — ABNORMAL HIGH (ref 44–121)
BUN/Creatinine Ratio: 11 (ref 9–23)
BUN: 11 mg/dL (ref 6–24)
Bilirubin Total: 0.3 mg/dL (ref 0.0–1.2)
CO2: 22 mmol/L (ref 20–29)
Calcium: 10.1 mg/dL (ref 8.7–10.2)
Chloride: 101 mmol/L (ref 96–106)
Creatinine, Ser: 0.99 mg/dL (ref 0.57–1.00)
Globulin, Total: 3.3 g/dL (ref 1.5–4.5)
Glucose: 95 mg/dL (ref 70–99)
Potassium: 5.9 mmol/L — ABNORMAL HIGH (ref 3.5–5.2)
Sodium: 139 mmol/L (ref 134–144)
Total Protein: 7.5 g/dL (ref 6.0–8.5)
eGFR: 68 mL/min/{1.73_m2} (ref >59)

## 2022-08-07 LAB — VITAMIN D 25 HYDROXY (VIT D DEFICIENCY, FRACTURES): Vit D, 25-Hydroxy: 22 ng/mL — ABNORMAL LOW (ref 30.0–100.0)

## 2022-08-07 LAB — TSH: TSH: 2.53 u[IU]/mL (ref 0.450–4.500)

## 2022-08-07 LAB — LIPID PANEL
Chol/HDL Ratio: 5.5 ratio — ABNORMAL HIGH (ref 0.0–4.4)
Cholesterol, Total: 231 mg/dL — ABNORMAL HIGH (ref 100–199)
HDL: 42 mg/dL (ref >39)
LDL Chol Calc (NIH): 144 mg/dL — ABNORMAL HIGH (ref 0–99)
Triglycerides: 248 mg/dL — ABNORMAL HIGH (ref 0–149)
VLDL Cholesterol Cal: 45 mg/dL — ABNORMAL HIGH (ref 5–40)

## 2022-08-07 LAB — HEMOGLOBIN A1C
Est. average glucose Bld gHb Est-mCnc: 120 mg/dL
Hgb A1c MFr Bld: 5.8 % — ABNORMAL HIGH (ref 4.8–5.6)

## 2022-08-07 MED ORDER — VITAMIN D3 20 MCG (800 UNIT) PO TABS: 1.0000 | ORAL_TABLET | Freq: Every day | ORAL | 1 refills | Status: DC

## 2022-08-07 NOTE — Assessment & Plan Note (Signed)
Patient presents with right wrist pain after having a box fall on her right arm.  She estimates approximately 150 pounds.  She has history of right forearm surgery. Will send patient for x-ray of right wrist.

## 2022-08-07 NOTE — Progress Notes (Signed)
Called patient and she is finding improvement with her fibromyalgia since starting gabapentin.  She has mixed hyperlipidemia. The 10-year ASCVD risk score (Arnett DK, et al., 2019) is: 8.1%.    She has elevated alk phos and records are being obtained to find out prior workup and history of liver disease.  I do not see a contraindication at this time to starting statin. She has follow-up with gastroenterology tomorrow and would like to hold off on starting a statin until she has seen them.  Discussed diet and exercise recommendations.  Vitamin D is low and will send supplementation to pharmacy.  Potassium is 5.9.  Patient will ask for blood work to be drawn at her appointment tomorrow with gastroenterology and I will follow-up with potassium.  If no blood work is needed tomorrow she will call and schedule appointment to have it done at our office.  Medications reviewed and I do not see a specific medication which would be causing mild hyperkalemia.

## 2022-08-07 NOTE — Assessment & Plan Note (Signed)
Patient reports a history of elevated liver enzymes.  She was undergoing a workup with a gastroenterologist in [place].  She mention she had a liver biopsy and it was unclear of her diagnosis.  She was told she possibly had hepatitis but then it was ruled out.  Plan to have patient sign for records today and will refer patient to GI.

## 2022-08-07 NOTE — Assessment & Plan Note (Signed)
Patient has PHQ-9 of 17 today, but reports her depression is much better controlled today than normal on Cymbalta.  She has also diagnosed with fibromyalgia and deals with chronic pain which is making her feel worse today.  Refilled Cymbalta

## 2022-08-07 NOTE — Assessment & Plan Note (Signed)
Patient here to establish care in our clinic. She was previously followed in [place].  Will obtain baseline labs today.

## 2022-08-07 NOTE — Assessment & Plan Note (Signed)
Chronic problem. Recently went to urgent care for refill of oxycodone and given toradol shot.. Toradol helped with pain.  Patient takes Cymbalta which works well for her depression, but does not control her chronic pain.  She has also been prescribed oxycodone in [place], cannot review this in [place]. She has not taken in the last month.  Discussed I do not recommend this is a treatment for fibromyalgia.  We will start gabapentin today.

## 2022-08-07 NOTE — Assessment & Plan Note (Signed)
Patient is a current everyday smoker with 45 pack years.  Has history of COPD, no PFTs in our system. Currently on Breztri and as needed albuterol.

## 2022-08-07 NOTE — Assessment & Plan Note (Signed)
Chronic problem Refilled Trazadone

## 2022-08-08 ENCOUNTER — Telehealth: Payer: Self-pay | Admitting: Family Medicine

## 2022-08-08 ENCOUNTER — Other Ambulatory Visit: Payer: Self-pay

## 2022-08-08 ENCOUNTER — Encounter: Payer: Self-pay | Admitting: *Deleted

## 2022-08-08 ENCOUNTER — Encounter: Payer: Self-pay | Admitting: Gastroenterology

## 2022-08-08 ENCOUNTER — Ambulatory Visit: Payer: Medicare Other | Admitting: Gastroenterology

## 2022-08-08 ENCOUNTER — Telehealth: Payer: Self-pay | Admitting: *Deleted

## 2022-08-08 VITALS — BP 113/75 | HR 82 | Temp 97.4°F | Ht 65.0 in | Wt 185.8 lb

## 2022-08-08 DIAGNOSIS — Z860100 Personal history of colon polyps, unspecified: Secondary | ICD-10-CM | POA: Insufficient documentation

## 2022-08-08 DIAGNOSIS — Z0001 Encounter for general adult medical examination with abnormal findings: Secondary | ICD-10-CM

## 2022-08-08 DIAGNOSIS — R748 Abnormal levels of other serum enzymes: Secondary | ICD-10-CM

## 2022-08-08 DIAGNOSIS — R6881 Early satiety: Secondary | ICD-10-CM | POA: Diagnosis not present

## 2022-08-08 DIAGNOSIS — R634 Abnormal weight loss: Secondary | ICD-10-CM | POA: Diagnosis not present

## 2022-08-08 DIAGNOSIS — R1010 Upper abdominal pain, unspecified: Secondary | ICD-10-CM

## 2022-08-08 NOTE — Patient Instructions (Signed)
We will arrange for you to have an upper endoscopy and colonoscopy at [facility] with Dr. Abbey Chatters.  We are requesting all of your records regarding your elevated alkaline phosphatase from your primary gastroenterologist in [place].  Once I have reviewed these records, we will call you with additional recommendations.  It is okay to start the medication for your cholesterol that your primary care provider wanted to prescribe.  Please follow-up with your primary care doctor on your elevated potassium as we are not ordering any blood work today.  It is okay to take Tylenol if needed.  No more than 2000 mg per 24 hours.  Will plan to see you back in the office after your procedures, but will be in touch with you soon once I have reviewed your records.  It was nice to meet you today!  Aliene Altes, PA-C [facility]

## 2022-08-08 NOTE — Telephone Encounter (Signed)
Yes that's okay

## 2022-08-08 NOTE — Telephone Encounter (Signed)
[employer] PA:  Notification or Prior Authorization is not required for the requested services Decision ID #: [id number]

## 2022-08-08 NOTE — Telephone Encounter (Signed)
Pt needs orders in for potassium check . Wants a call when entered

## 2022-08-08 NOTE — Telephone Encounter (Signed)
Lab ordered, patient notified

## 2022-08-09 ENCOUNTER — Emergency Department (HOSPITAL_COMMUNITY): Payer: Medicare Other

## 2022-08-09 ENCOUNTER — Emergency Department (HOSPITAL_COMMUNITY)
Admission: EM | Admit: 2022-08-09 | Discharge: 2022-08-09 | Disposition: A | Discharge status: Home / Self Care | Payer: Medicare Other | Attending: Emergency Medicine | Admitting: Emergency Medicine

## 2022-08-09 DIAGNOSIS — R0602 Shortness of breath: Secondary | ICD-10-CM | POA: Insufficient documentation

## 2022-08-09 DIAGNOSIS — Z91040 Latex allergy status: Secondary | ICD-10-CM | POA: Insufficient documentation

## 2022-08-09 DIAGNOSIS — R079 Chest pain, unspecified: Secondary | ICD-10-CM | POA: Diagnosis present

## 2022-08-09 DIAGNOSIS — Z87891 Personal history of nicotine dependence: Secondary | ICD-10-CM | POA: Diagnosis not present

## 2022-08-09 DIAGNOSIS — R0789 Other chest pain: Secondary | ICD-10-CM | POA: Insufficient documentation

## 2022-08-09 DIAGNOSIS — J449 Chronic obstructive pulmonary disease, unspecified: Secondary | ICD-10-CM | POA: Insufficient documentation

## 2022-08-09 DIAGNOSIS — R42 Dizziness and giddiness: Secondary | ICD-10-CM | POA: Insufficient documentation

## 2022-08-09 DIAGNOSIS — R531 Weakness: Secondary | ICD-10-CM | POA: Insufficient documentation

## 2022-08-09 LAB — CBC
HCT: 43.1 % (ref 36.0–46.0)
Hemoglobin: 13.5 g/dL (ref 12.0–15.0)
MCH: 28.7 pg (ref 26.0–34.0)
MCHC: 31.3 g/dL (ref 30.0–36.0)
MCV: 91.7 fL (ref 80.0–100.0)
Platelets: 400 10*3/uL (ref 150–400)
RBC: 4.7 MIL/uL (ref 3.87–5.11)
RDW: 13.9 % (ref 11.5–15.5)
WBC: 8.5 10*3/uL (ref 4.0–10.5)
nRBC: 0 % (ref 0.0–0.2)

## 2022-08-09 LAB — TROPONIN I (HIGH SENSITIVITY)
Troponin I (High Sensitivity): 3 ng/L (ref <18)
Troponin I (High Sensitivity): 4 ng/L (ref <18)

## 2022-08-09 LAB — BASIC METABOLIC PANEL
Anion gap: 11 (ref 5–15)
BUN: 12 mg/dL (ref 6–20)
CO2: 25 mmol/L (ref 22–32)
Calcium: 9.1 mg/dL (ref 8.9–10.3)
Chloride: 99 mmol/L (ref 98–111)
Creatinine, Ser: 1.07 mg/dL — ABNORMAL HIGH (ref 0.44–1.00)
GFR, Estimated: >60 mL/min (ref >60)
Glucose, Bld: 92 mg/dL (ref 70–99)
Potassium: 3.5 mmol/L (ref 3.5–5.1)
Sodium: 135 mmol/L (ref 135–145)

## 2022-08-09 NOTE — ED Provider Triage Note (Signed)
Emergency Medicine Provider Triage Evaluation Note  Jennifer Rollins , a 52 y.o. female  was evaluated in triage.  Pt complains of dizziness and weakness.  Pt saw her MD and  had blood work that showed an elevated potassium.  Pt recently had medications changed   Review of Systems  Positive: weakness Negative: fever  Physical Exam  LMP 08/19/2014  Gen:   Awake, no distress   Resp:  Normal effort  MSK:   Moves extremities without difficulty  Other:    Medical Decision Making  Medically screening exam initiated at 5:14 PM.  Appropriate orders placed.  Jennifer Rollins was informed that the remainder of the evaluation will be completed by another provider, this initial triage assessment does not replace that evaluation, and the importance of remaining in the ED until their evaluation is complete.     Fransico Meadow, [place] 08/09/22 1715

## 2022-08-09 NOTE — ED Provider Notes (Addendum)
[facility] Provider Note   CSN: [id number] Arrival date & time: 08/09/22  1619     History  No chief complaint on file.   Jennifer Rollins is a 52 y.o. female.  HPI   This patient is a 52 year old female who reports that she has had a couple of days of chest discomfort, it has been rather persistent throughout the day today with some associated dizziness weakness and shortness of breath.  She actually states that she feels fine at this time without any symptoms.  She has recently had blood work which showed a possible elevation in her potassium but no cardiac history, no pulmonary history, no history of DVT or pulmonary embolism and no risk for the same.  She denies exertional symptoms  Home Medications Prior to Admission medications   Medication Sig Start Date End Date Taking? Authorizing Provider  albuterol (VENTOLIN HFA) 108 (90 Base) MCG/ACT inhaler Inhale 2 puffs into the lungs every 6 (six) hours as needed for wheezing or shortness of breath. 08/06/22   Lyndal Pulley, MD  Budeson-Glycopyrrol-Formoterol (BREZTRI AEROSPHERE) 160-9-4.8 MCG/ACT AERO Inhale 2 puffs into the lungs 2 (two) times daily. 08/06/22   Lyndal Pulley, MD  Cholecalciferol (VITAMIN D3) 20 MCG (800 UNIT) TABS Take 1 tablet by mouth daily. Patient not taking: Reported on 08/08/2022 08/07/22   Lyndal Pulley, MD  Docusate Sodium (DSS) 100 MG CAPS TAKE 1 CAPSULE BY MOUTH TWICE DAILY AS NEEDED FOR CONSTIPATION    [provider]  DULoxetine (CYMBALTA) 60 MG capsule Take 1 capsule (60 mg total) by mouth daily. 08/06/22   Lyndal Pulley, MD  gabapentin (NEURONTIN) 300 MG capsule Take 1 capsule (300 mg total) by mouth 3 (three) times daily. 08/06/22   Lyndal Pulley, MD  traZODone (DESYREL) 150 MG tablet Take 1-2 tablets (150-300 mg total) by mouth at bedtime. 08/06/22   Lyndal Pulley, MD  dicyclomine (BENTYL) 20 MG tablet Take one every 6 hours for abd cramps Patient not  taking: Reported on 09/30/2015 09/14/15 09/30/15  Milton Ferguson, MD      Allergies    Flagyl [metronidazole], Latex, and Lithium    Review of Systems   Review of Systems  All other systems reviewed and are negative.   Physical Exam Updated Vital Signs BP 113/61 (BP Location: Right Arm)   Pulse (!) 56   Temp 98.1 F (36.7 C) (Oral)   Resp 16   LMP 08/19/2014   SpO2 100%  Physical Exam Vitals and nursing note reviewed.  Constitutional:      General: She is not in acute distress.    Appearance: She is well-developed.  HENT:     Head: Normocephalic and atraumatic.     Mouth/Throat:     Pharynx: No oropharyngeal exudate.  Eyes:     General: No scleral icterus.       Right eye: No discharge.        Left eye: No discharge.     Conjunctiva/sclera: Conjunctivae normal.     Pupils: Pupils are equal, round, and reactive to light.  Neck:     Thyroid: No thyromegaly.     Vascular: No JVD.  Cardiovascular:     Rate and Rhythm: Normal rate and regular rhythm.     Heart sounds: Normal heart sounds. No murmur heard.    No friction rub. No gallop.  Pulmonary:     Effort: Pulmonary effort is normal. No respiratory distress.  Breath sounds: Normal breath sounds. No wheezing or rales.  Abdominal:     General: Bowel sounds are normal. There is no distension.     Palpations: Abdomen is soft. There is no mass.     Tenderness: There is no abdominal tenderness.  Musculoskeletal:        General: No tenderness. Normal range of motion.     Cervical back: Normal range of motion and neck supple.  Lymphadenopathy:     Cervical: No cervical adenopathy.  Skin:    General: Skin is warm and dry.     Findings: No erythema or rash.  Neurological:     Mental Status: She is alert.     Coordination: Coordination normal.  Psychiatric:        Behavior: Behavior normal.     ED Results / Procedures / Treatments   Labs (all labs ordered are listed, but only abnormal results are displayed) Labs  Reviewed  BASIC METABOLIC PANEL - Abnormal; Notable for the following components:      Result Value   Creatinine, Ser 1.07 (*)    All other components within normal limits  CBC  POC URINE PREG, ED  TROPONIN I (HIGH SENSITIVITY)  TROPONIN I (HIGH SENSITIVITY)    EKG EKG Interpretation  Date/Time:  Friday August 09 2022 16:46:27 EDT Ventricular Rate:  71 PR Interval:  178 QRS Duration: 88 QT Interval:  416 QTC Calculation: 452 R Axis:   82 Text Interpretation: Normal sinus rhythm Low voltage QRS Nonspecific ST abnormality Abnormal ECG When compared with ECG of 14-Sep-2015 09:22, PREVIOUS ECG IS PRESENT Confirmed by Noemi Chapel [phone]) on 08/09/2022 5:04:12 PM  Radiology DG Chest Port 1 View  Result Date: 08/09/2022 CLINICAL DATA:  Shortness of breath EXAM: PORTABLE CHEST 1 VIEW COMPARISON:  09/14/2015 FINDINGS: The heart size and mediastinal contours are within normal limits. Both lungs are clear. The visualized skeletal structures are unremarkable. IMPRESSION: No active disease. Electronically Signed   By: Donavan Foil M.D.   On: 08/09/2022 17:12    Procedures Procedures    Medications Ordered in ED Medications - No data to display  ED Course/ Medical Decision Making/ A&P                             Medical Decision Making Amount and/or Complexity of Data Reviewed Labs: ordered. Radiology: ordered.   This patient presents to the ED for concern of chest pain and shortness of breath with dizziness differential diagnosis includes cardiac disease, pulmonary embolism, pneumothorax, pneumonia, pericarditis    Additional history obtained:  Additional history obtained from electronic medical record External records from outside source obtained and reviewed including history of fibromyalgia followed by family doctor, history of COPD, she is not wheezing, history of colonoscopy which is coming up in April, no history of cardiac disease No prior echocardiograms or cardiac  workup    Lab Tests:  I Ordered, and personally interpreted labs.  The pertinent results include: CBC and basic metabolic panel unremarkable, troponin negative at 3   Imaging Studies ordered:  I ordered imaging studies including chest x-ray I independently visualized and interpreted imaging which showed no acute findings I agree with the radiologist interpretation   Medicines ordered and prescription drug management:  I ordered medication including aspirin for chest pain for home Reevaluation of the patient after these medicines showed that the patient resolved I have reviewed the patients home medicines and have made adjustments as  needed   Problem List / ED Course:  Patient is well-appearing, low risk for cardiac disease, EKG unremarkable, labs unremarkable, patient will be discharged home in stable condition   Social Determinants of Health:  COPD, counseled on tobacco use           Final Clinical Impression(s) / ED Diagnoses Final diagnoses:  Chest pain at rest    Rx / DC Orders ED Discharge Orders     None         Noemi Chapel, MD 08/09/22 1950    Noemi Chapel, MD 08/09/22 [phone]

## 2022-08-09 NOTE — Discharge Instructions (Signed)
Your testing has been normal, no signs of lung disease causing symptoms today, no signs of heart disease, EKG was reassuring, blood work was normal  Take a baby aspirin daily, follow-up with cardiology this week, ER for worsening symptoms

## 2022-08-10 LAB — POTASSIUM: Potassium: 4.4 mmol/L (ref 3.5–5.2)

## 2022-08-11 ENCOUNTER — Telehealth: Payer: Self-pay | Admitting: Gastroenterology

## 2022-08-11 DIAGNOSIS — R748 Abnormal levels of other serum enzymes: Secondary | ICD-10-CM

## 2022-08-11 DIAGNOSIS — K7400 Hepatic fibrosis, unspecified: Secondary | ICD-10-CM

## 2022-08-11 NOTE — Telephone Encounter (Signed)
Received records from [facility] gastroenterology. No labs included regarding workup for elevated alkaline phosphatase.  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  Comment states differential includes nonhepatic tropic viral infection, drug or toxin induced liver injury, exposure to chemicals or solvents.  MRI abdomen with and without contrast 08/17/2021: Mildly enlarged liver, portions of the liver capsule slightly nodular, cannot exclude early changes of cirrhosis.  Normal spleen size.  No ascites.  No adenopathy.  No suspicious enhancing liver mass.  Variant cecal anatomy (located in the midline)  EGD 04/10/2021: Irregularity in the Z-line and GE junction biopsied, congestion and erythema in the antrum biopsied, normal examined duodenum biopsied.  Z-line/GE junction pathology with mild chronic inflammation, gastric biopsy with minimal chronic gastritis, negative for H. pylori, duodenal biopsy with intact villous architecture.  Colonoscopy 04/10/2021: Normal colonic and ileal mucosa s/p multiple biopsies including biopsy of TI, grade 1 internal hemorrhoids.  TI biopsy with focal active colitis with granuloma formation, favor involvement by Crohn's, biopsy of the ascending colon, transverse colon, descending colon, sigmoid colon, normal.   Courtney: Please let patient know we received records from [facility] gastroenterology.  No laboratory evaluation was received, but liver biopsy, prior EGD, and colonoscopy records were reviewed.  No specific diagnosis was given on liver biopsy.  At this point, I do recommend that we obtain some additional lab work as well as updating an ultrasound to take another look at her liver.  Regarding her prior EGD and colonoscopy, one of the biopsies on her colonoscopy favored Crohn's  disease.  Does she remember ever being told she had a diagnosis of Crohn's disease?  Has she ever been on any medications for Crohn's disease?   Please arrange the following labs: Alkaline phosphatase isoenzymes, ANA, AMA, ASMA, immunoglobulins, hepatitis A antibody total, hepatitis B surface antibody, hepatitis B core antibody, hepatitis B surface antigen, hepatitis C antibody.  Dx: Elevated alkaline phosphatase.    Tammy/Mindy: Please arrange abdominal ultrasound with elastography. Dx: Elevated alkaline phosphatase, liver fibrosis    Dr. Abbey Chatters, [facility] as patient is scheduled for procedures with you 08/29/22. Prior colonoscopy with TI biopsy concerning for possible Crohn's thought mucosa appeared normal.

## 2022-08-12 ENCOUNTER — Other Ambulatory Visit: Payer: Self-pay | Admitting: Internal Medicine

## 2022-08-12 ENCOUNTER — Telehealth: Payer: Self-pay | Admitting: Internal Medicine

## 2022-08-12 ENCOUNTER — Other Ambulatory Visit: Payer: Self-pay | Admitting: *Deleted

## 2022-08-12 ENCOUNTER — Encounter: Payer: Self-pay | Admitting: *Deleted

## 2022-08-12 DIAGNOSIS — R748 Abnormal levels of other serum enzymes: Secondary | ICD-10-CM

## 2022-08-12 MED ORDER — DULOXETINE HCL 30 MG PO CPEP: 60.0000 mg | ORAL_CAPSULE | Freq: Every day | ORAL | 0 refills | Status: DC

## 2022-08-12 NOTE — Telephone Encounter (Signed)
Patient called said this medicine DULoxetine (CYMBALTA) 60 MG capsule ID:[id number]  is not working needs for you to change it back to what she was taken before DULoxetine (CYMBALTA) 30 MG capsule  Pharmacy  [place], [place] - [address] [place], [place] [address] Phone: [phone]  Fax: [phone]

## 2022-08-12 NOTE — Progress Notes (Unsigned)
Cre

## 2022-08-12 NOTE — Telephone Encounter (Signed)
Pt request old dose of cymbalta. Pls advise

## 2022-08-12 NOTE — Telephone Encounter (Signed)
Can this be change back? Per pt request

## 2022-08-12 NOTE — Telephone Encounter (Signed)
Sent pt a MyChart message and spoke to her. Informed her of results and recommendations. Pt voiced understanding. Pt states no she was never told she had Crohn's disease. Labs entered into Epic.

## 2022-08-12 NOTE — Telephone Encounter (Signed)
Please ask Dr.Steen he increased her dose- Thank you

## 2022-08-12 NOTE — Telephone Encounter (Signed)
Pt has 3 days left of meds Previous messages

## 2022-08-13 ENCOUNTER — Other Ambulatory Visit: Payer: Self-pay | Admitting: Gastroenterology

## 2022-08-13 DIAGNOSIS — R748 Abnormal levels of other serum enzymes: Secondary | ICD-10-CM

## 2022-08-13 NOTE — Addendum Note (Signed)
Addended by: Inda Castle on: 08/13/2022 08:21 AM   Modules accepted: Orders

## 2022-08-13 NOTE — Addendum Note (Signed)
Addended by: Cheron Every on: 08/13/2022 09:04 AM   Modules accepted: Orders

## 2022-08-15 LAB — SPECIMEN STATUS REPORT

## 2022-08-15 LAB — IGG, IGA, IGM
IgA/Immunoglobulin A, Serum: 555 mg/dL — ABNORMAL HIGH (ref 87–352)
IgG (Immunoglobin G), Serum: 1281 mg/dL (ref 586–1602)
IgM (Immunoglobulin M), Srm: 97 mg/dL (ref 26–217)

## 2022-08-15 LAB — HEPATITIS B CORE ANTIBODY, TOTAL: Hep B Core Total Ab: NEGATIVE

## 2022-08-15 NOTE — Telephone Encounter (Signed)
Noted  

## 2022-08-17 LAB — ALKALINE PHOSPHATASE, ISOENZYMES
Alkaline Phosphatase: 244 IU/L — ABNORMAL HIGH (ref 44–121)
BONE FRACTION: 30 % (ref 14–68)
INTESTINAL FRAC.: 0 % (ref 0–18)
LIVER FRACTION: 70 % (ref 18–85)

## 2022-08-17 LAB — IMMUNOGLOBULINS A/E/G/M, SERUM
IgA/Immunoglobulin A, Serum: 567 mg/dL — ABNORMAL HIGH (ref 87–352)
IgE (Immunoglobulin E), Serum: 41 IU/mL (ref 6–495)
IgG (Immunoglobin G), Serum: 1263 mg/dL (ref 586–1602)
IgM (Immunoglobulin M), Srm: 85 mg/dL (ref 26–217)

## 2022-08-17 LAB — HEPATITIS B SURFACE ANTIBODY, QUANTITATIVE: Hepatitis B Surf Ab Quant: <3.1 m[IU]/mL — ABNORMAL LOW (ref >9.9)

## 2022-08-17 LAB — MITOCHONDRIAL ANTIBODIES: Mitochondrial Ab: <20 Units (ref 0.0–20.0)

## 2022-08-17 LAB — ANA W/REFLEX: Anti Nuclear Antibody (ANA): NEGATIVE

## 2022-08-17 LAB — HEPATITIS B SURFACE ANTIBODY,QUALITATIVE: Hep B Surface Ab, Qual: NONREACTIVE

## 2022-08-17 LAB — HEPATITIS B CORE ANTIBODY, IGM: Hep B C IgM: NEGATIVE

## 2022-08-17 LAB — ANTI-SMOOTH MUSCLE ANTIBODY, IGG: Smooth Muscle Ab: 6 Units (ref 0–19)

## 2022-08-17 LAB — HEPATITIS A ANTIBODY, TOTAL: hep A Total Ab: NEGATIVE

## 2022-08-17 LAB — HEPATITIS C ANTIBODY: Hep C Virus Ab: NONREACTIVE

## 2022-08-21 ENCOUNTER — Telehealth: Payer: Self-pay | Admitting: *Deleted

## 2022-08-21 NOTE — Telephone Encounter (Signed)
Pt called and said she "may" have to cancel [place] and tcs/egd due to insurance reasons. She will try to find out more and give [place] a call back tomorrow.

## 2022-08-26 ENCOUNTER — Ambulatory Visit (HOSPITAL_COMMUNITY)
Admission: RE | Admit: 2022-08-26 | Discharge: 2022-08-26 | Disposition: A | Discharge status: Home / Self Care | Payer: Medicare Other | Source: Ambulatory Visit | Attending: Gastroenterology | Admitting: Gastroenterology

## 2022-08-26 DIAGNOSIS — K7400 Hepatic fibrosis, unspecified: Secondary | ICD-10-CM | POA: Diagnosis present

## 2022-08-26 DIAGNOSIS — R748 Abnormal levels of other serum enzymes: Secondary | ICD-10-CM | POA: Diagnosis present

## 2022-08-26 NOTE — Patient Instructions (Signed)
Jennifer Rollins  08/26/2022     @PREFPERIOPPHARMACY @   Your procedure is scheduled on  08/29/2022.   Report to [facility] at  0930  A.M.   Call this number if you have problems the morning of surgery:  [phone]  If you experience any cold or flu symptoms such as cough, fever, chills, shortness of breath, etc. between now and your scheduled surgery, please notify us at the above number.   Remember:  Follow the diet and prep instructions given to you by the office.     Take these medicines the morning of surgery with A SIP OF WATER                                      Cymbalta.          DO NOT wear jewelry, make-up or nail polish.  Do not wear lotions, powders, or perfumes, or deodorant.  Do not shave 48 hours prior to surgery.  Men may shave face and neck.  Do not bring valuables to the hospital.  [facility] is not responsible for any belongings or valuables.  Contacts, dentures or bridgework may not be worn into surgery.  Leave your suitcase in the car.  After surgery it may be brought to your room.  For patients admitted to the hospital, discharge time will be determined by your treatment team.  Patients discharged the day of surgery will not be allowed to drive home and must have someone with them for 24 hours.    Special instructions:   DO NOT smoke tobacco or vape for 24 hours before your procedure.  Please read over the following fact sheets that you were given. Anesthesia Post-op Instructions and Care and Recovery After Surgery      Upper Endoscopy, Adult, Care After After the procedure, it is common to have a sore throat. It is also common to have: Mild stomach pain or discomfort. Bloating. Nausea. Follow these instructions at home: The instructions below may help you care for yourself at home. Your health care provider may give you more instructions. If you have questions, ask your health care provider. If you were given a sedative during the  procedure, it can affect you for several hours. Do not drive or operate machinery until your health care provider says that it is safe. If you will be going home right after the procedure, plan to have a responsible adult: Take you home from the hospital or clinic. You will not be allowed to drive. Care for you for the time you are told. Follow instructions from your health care provider about what you may eat and drink. Return to your normal activities as told by your health care provider. Ask your health care provider what activities are safe for you. Take over-the-counter and prescription medicines only as told by your health care provider. Contact a health care provider if you: Have a sore throat that lasts longer than one day. Have trouble swallowing. Have a fever. Get help right away if you: Vomit blood or your vomit looks like coffee grounds. Have bloody, black, or tarry stools. Have a very bad sore throat or you cannot swallow. Have difficulty breathing or very bad pain in your chest or abdomen. These symptoms may be an emergency. Get help right away. Call 911. Do not wait to see if the symptoms will  go away. Do not drive yourself to the hospital. Summary After the procedure, it is common to have a sore throat, mild stomach discomfort, bloating, and nausea. If you were given a sedative during the procedure, it can affect you for several hours. Do not drive until your health care provider says that it is safe. Follow instructions from your health care provider about what you may eat and drink. Return to your normal activities as told by your health care provider. This information is not intended to replace advice given to you by your health care provider. Make sure you discuss any questions you have with your health care provider. Document Revised: 08/22/2021 Document Reviewed: 08/22/2021 Elsevier Patient Education  2023 [employer] Colonoscopy, Adult, Care After The following  information offers guidance on how to care for yourself after your procedure. Your health care provider may also give you more specific instructions. If you have problems or questions, contact your health care provider. What can I expect after the procedure? After the procedure, it is common to have: A small amount of blood in your stool for 24 hours after the procedure. Some gas. Mild cramping or bloating of your abdomen. Follow these instructions at home: Eating and drinking  Drink enough fluid to keep your urine pale yellow. Follow instructions from your health care provider about eating or drinking restrictions. Resume your normal diet as told by your health care provider. Avoid heavy or fried foods that are hard to digest. Activity Rest as told by your health care provider. Avoid sitting for a long time without moving. Get up to take short walks every 1-2 hours. This is important to improve blood flow and breathing. Ask for help if you feel weak or unsteady. Return to your normal activities as told by your health care provider. Ask your health care provider what activities are safe for you. Managing cramping and bloating  Try walking around when you have cramps or feel bloated. If directed, apply heat to your abdomen as told by your health care provider. Use the heat source that your health care provider recommends, such as a moist heat pack or a heating pad. Place a towel between your skin and the heat source. Leave the heat on for 20-30 minutes. Remove the heat if your skin turns bright red. This is especially important if you are unable to feel pain, heat, or cold. You have a greater risk of getting burned. General instructions If you were given a sedative during the procedure, it can affect you for several hours. Do not drive or operate machinery until your health care provider says that it is safe. For the first 24 hours after the procedure: Do not sign important documents. Do  not drink alcohol. Do your regular daily activities at a slower pace than normal. Eat soft foods that are easy to digest. Take over-the-counter and prescription medicines only as told by your health care provider. Keep all follow-up visits. This is important. Contact a health care provider if: You have blood in your stool 2-3 days after the procedure. Get help right away if: You have more than a small spotting of blood in your stool. You have large blood clots in your stool. You have swelling of your abdomen. You have nausea or vomiting. You have a fever. You have increasing pain in your abdomen that is not relieved with medicine. These symptoms may be an emergency. Get help right away. Call 911. Do not wait to see if the symptoms will  go away. Do not drive yourself to the hospital. Summary After the procedure, it is common to have a small amount of blood in your stool. You may also have mild cramping and bloating of your abdomen. If you were given a sedative during the procedure, it can affect you for several hours. Do not drive or operate machinery until your health care provider says that it is safe. Get help right away if you have a lot of blood in your stool, nausea or vomiting, a fever, or increased pain in your abdomen. This information is not intended to replace advice given to you by your health care provider. Make sure you discuss any questions you have with your health care provider. Document Revised: 01/03/2021 Document Reviewed: 01/03/2021 Elsevier Patient Education  [place] After The following information offers guidance on how to care for yourself after your procedure. Your health care provider may also give you more specific instructions. If you have problems or questions, contact your health care provider. What can I expect after the procedure? After the procedure, it is common to have: Tiredness. Little or no memory about what  happened during or after the procedure. Impaired judgment when it comes to making decisions. Nausea or vomiting. Some trouble with balance. Follow these instructions at home: For the time period you were told by your health care provider:  Rest. Do not participate in activities where you could fall or become injured. Do not drive or use machinery. Do not drink alcohol. Do not take sleeping pills or medicines that cause drowsiness. Do not make important decisions or sign legal documents. Do not take care of children on your own. Medicines Take over-the-counter and prescription medicines only as told by your health care provider. If you were prescribed antibiotics, take them as told by your health care provider. Do not stop using the antibiotic even if you start to feel better. Eating and drinking Follow instructions from your health care provider about what you may eat and drink. Drink enough fluid to keep your urine pale yellow. If you vomit: Drink clear fluids slowly and in small amounts as you are able. Clear fluids include water, ice chips, low-calorie sports drinks, and fruit juice that has water added to it (diluted fruit juice). Eat light and bland foods in small amounts as you are able. These foods include bananas, applesauce, rice, lean meats, toast, and crackers. General instructions  Have a responsible adult stay with you for the time you are told. It is important to have someone help care for you until you are awake and alert. If you have sleep apnea, surgery and some medicines can increase your risk for breathing problems. Follow instructions from your health care provider about wearing your sleep device: When you are sleeping. This includes during daytime naps. While taking prescription pain medicines, sleeping medicines, or medicines that make you drowsy. Do not use any products that contain nicotine or tobacco. These products include cigarettes, chewing tobacco, and vaping  devices, such as e-cigarettes. If you need help quitting, ask your health care provider. Contact a health care provider if: You feel nauseous or vomit every time you eat or drink. You feel light-headed. You are still sleepy or having trouble with balance after 24 hours. You get a rash. You have a fever. You have redness or swelling around the IV site. Get help right away if: You have trouble breathing. You have new confusion after you get home. These symptoms may  be an emergency. Get help right away. Call 911. Do not wait to see if the symptoms will go away. Do not drive yourself to the hospital. This information is not intended to replace advice given to you by your health care provider. Make sure you discuss any questions you have with your health care provider. Document Revised: 10/08/2021 Document Reviewed: 10/08/2021 Elsevier Patient Education  [place].

## 2022-08-27 ENCOUNTER — Telehealth: Payer: Self-pay | Admitting: *Deleted

## 2022-08-27 ENCOUNTER — Encounter: Payer: Self-pay | Admitting: *Deleted

## 2022-08-27 ENCOUNTER — Encounter (HOSPITAL_COMMUNITY): Payer: Self-pay

## 2022-08-27 ENCOUNTER — Encounter (HOSPITAL_COMMUNITY)
Admission: RE | Admit: 2022-08-27 | Discharge: 2022-08-27 | Disposition: A | Discharge status: Home / Self Care | Payer: Medicare Other | Source: Ambulatory Visit | Attending: Internal Medicine | Admitting: Internal Medicine

## 2022-08-27 VITALS — BP 125/69 | HR 65 | Temp 98.1°F | Resp 18 | Ht 65.0 in | Wt 185.8 lb

## 2022-08-27 DIAGNOSIS — Z01812 Encounter for preprocedural laboratory examination: Secondary | ICD-10-CM | POA: Diagnosis not present

## 2022-08-27 DIAGNOSIS — K7581 Nonalcoholic steatohepatitis (NASH): Secondary | ICD-10-CM | POA: Insufficient documentation

## 2022-08-27 LAB — PROTIME-INR
INR: 0.9 (ref 0.8–1.2)
Prothrombin Time: 12.4 s (ref 11.4–15.2)

## 2022-08-27 NOTE — Telephone Encounter (Signed)
Got call from short stay and pt was there for her pre-op visit. Pt is allergic to dulcolax and wanted something different. Gave pt sample of Clenpiq and new instructions

## 2022-08-29 ENCOUNTER — Encounter (HOSPITAL_COMMUNITY)
Admission: RE | Disposition: A | Discharge status: Home / Self Care | Payer: Self-pay | Source: Ambulatory Visit | Attending: Internal Medicine

## 2022-08-29 ENCOUNTER — Ambulatory Visit (HOSPITAL_COMMUNITY): Payer: Medicare Other | Admitting: Anesthesiology

## 2022-08-29 ENCOUNTER — Ambulatory Visit (HOSPITAL_BASED_OUTPATIENT_CLINIC_OR_DEPARTMENT_OTHER): Payer: Medicare Other | Admitting: Anesthesiology

## 2022-08-29 ENCOUNTER — Telehealth: Payer: Self-pay

## 2022-08-29 ENCOUNTER — Ambulatory Visit (HOSPITAL_COMMUNITY)
Admission: RE | Admit: 2022-08-29 | Discharge: 2022-08-29 | Disposition: A | Discharge status: Home / Self Care | Payer: Medicare Other | Source: Ambulatory Visit | Attending: Internal Medicine | Admitting: Internal Medicine

## 2022-08-29 ENCOUNTER — Encounter (HOSPITAL_COMMUNITY): Payer: Self-pay

## 2022-08-29 DIAGNOSIS — J449 Chronic obstructive pulmonary disease, unspecified: Secondary | ICD-10-CM | POA: Insufficient documentation

## 2022-08-29 DIAGNOSIS — K5909 Other constipation: Secondary | ICD-10-CM | POA: Insufficient documentation

## 2022-08-29 DIAGNOSIS — G709 Myoneural disorder, unspecified: Secondary | ICD-10-CM | POA: Insufficient documentation

## 2022-08-29 DIAGNOSIS — F17210 Nicotine dependence, cigarettes, uncomplicated: Secondary | ICD-10-CM

## 2022-08-29 DIAGNOSIS — R6881 Early satiety: Secondary | ICD-10-CM | POA: Insufficient documentation

## 2022-08-29 DIAGNOSIS — R634 Abnormal weight loss: Secondary | ICD-10-CM | POA: Insufficient documentation

## 2022-08-29 DIAGNOSIS — K759 Inflammatory liver disease, unspecified: Secondary | ICD-10-CM | POA: Insufficient documentation

## 2022-08-29 DIAGNOSIS — K2970 Gastritis, unspecified, without bleeding: Secondary | ICD-10-CM | POA: Diagnosis not present

## 2022-08-29 DIAGNOSIS — Z6830 Body mass index (BMI) 30.0-30.9, adult: Secondary | ICD-10-CM | POA: Diagnosis not present

## 2022-08-29 DIAGNOSIS — K259 Gastric ulcer, unspecified as acute or chronic, without hemorrhage or perforation: Secondary | ICD-10-CM | POA: Diagnosis not present

## 2022-08-29 DIAGNOSIS — R1013 Epigastric pain: Secondary | ICD-10-CM | POA: Diagnosis present

## 2022-08-29 DIAGNOSIS — E785 Hyperlipidemia, unspecified: Secondary | ICD-10-CM | POA: Insufficient documentation

## 2022-08-29 DIAGNOSIS — K2100 Gastro-esophageal reflux disease with esophagitis, without bleeding: Secondary | ICD-10-CM | POA: Diagnosis not present

## 2022-08-29 DIAGNOSIS — F419 Anxiety disorder, unspecified: Secondary | ICD-10-CM | POA: Diagnosis not present

## 2022-08-29 DIAGNOSIS — F32A Depression, unspecified: Secondary | ICD-10-CM | POA: Insufficient documentation

## 2022-08-29 DIAGNOSIS — K209 Esophagitis, unspecified without bleeding: Secondary | ICD-10-CM

## 2022-08-29 DIAGNOSIS — R1033 Periumbilical pain: Secondary | ICD-10-CM | POA: Diagnosis not present

## 2022-08-29 DIAGNOSIS — R7303 Prediabetes: Secondary | ICD-10-CM | POA: Insufficient documentation

## 2022-08-29 DIAGNOSIS — Z86010 Personal history of colonic polyps: Secondary | ICD-10-CM

## 2022-08-29 DIAGNOSIS — G4730 Sleep apnea, unspecified: Secondary | ICD-10-CM | POA: Insufficient documentation

## 2022-08-29 DIAGNOSIS — E875 Hyperkalemia: Secondary | ICD-10-CM | POA: Diagnosis not present

## 2022-08-29 HISTORY — PX: ESOPHAGOGASTRODUODENOSCOPY (EGD) WITH PROPOFOL: SHX5813

## 2022-08-29 HISTORY — PX: COLONOSCOPY WITH PROPOFOL: SHX5780

## 2022-08-29 HISTORY — PX: BIOPSY: SHX5522

## 2022-08-29 SURGERY — COLONOSCOPY WITH PROPOFOL
Anesthesia: General

## 2022-08-29 MED ORDER — PANTOPRAZOLE SODIUM 40 MG PO TBEC: 40.0000 mg | DELAYED_RELEASE_TABLET | Freq: Two times a day (BID) | ORAL | 11 refills | Status: DC

## 2022-08-29 MED ORDER — LACTATED RINGERS IV SOLN: INTRAVENOUS | Status: DC

## 2022-08-29 MED ORDER — LIDOCAINE HCL (CARDIAC) PF 100 MG/5ML IV SOSY
PREFILLED_SYRINGE | INTRAVENOUS | Status: DC | PRN
  Administered 2022-08-29: 50 mg via INTRAVENOUS

## 2022-08-29 MED ORDER — PROPOFOL 10 MG/ML IV BOLUS
INTRAVENOUS | Status: DC | PRN
  Administered 2022-08-29: 100 mg via INTRAVENOUS

## 2022-08-29 MED ORDER — NYSTATIN 100000 UNIT/ML MT SUSP: 5.0000 mL | Freq: Three times a day (TID) | OROMUCOSAL | 0 refills | Status: AC

## 2022-08-29 MED ORDER — PROPOFOL 500 MG/50ML IV EMUL
INTRAVENOUS | Status: DC | PRN
  Administered 2022-08-29: 150 ug/kg/min via INTRAVENOUS

## 2022-08-29 NOTE — Transfer of Care (Signed)
Immediate Anesthesia Transfer of Care Note  Patient: Jennifer Rollins  Procedure(s) Performed: COLONOSCOPY WITH PROPOFOL ESOPHAGOGASTRODUODENOSCOPY (EGD) WITH PROPOFOL BIOPSY  Patient Location: Short Stay  Anesthesia Type:General  Level of Consciousness: drowsy  Airway & Oxygen Therapy: Patient Spontanous Breathing  Post-op Assessment: Report given to RN and Post -op Vital signs reviewed and stable  Post vital signs: Reviewed and stable  Last Vitals:  Vitals Value Taken Time  BP 99/58 08/29/22 1127  Temp 36.8 C 08/29/22 1127  Pulse 66 08/29/22 1127  Resp 22 08/29/22 1127  SpO2 98 % 08/29/22 1127    Last Pain:  Vitals:   08/29/22 1127  TempSrc: Axillary  PainSc: 0-No pain         Complications: No notable events documented.

## 2022-08-29 NOTE — Discharge Instructions (Signed)
EGD Discharge instructions Please read the instructions outlined below and refer to this sheet in the next few weeks. These discharge instructions provide you with general information on caring for yourself after you leave the hospital. Your doctor may also give you specific instructions. While your treatment has been planned according to the most current medical practices available, unavoidable complications occasionally occur. If you have any problems or questions after discharge, please call your doctor. ACTIVITY You may resume your regular activity but move at a slower pace for the next 24 hours.  Take frequent rest periods for the next 24 hours.  Walking will help expel (get rid of) the air and reduce the bloated feeling in your abdomen.  No driving for 24 hours (because of the anesthesia (medicine) used during the test).  You may shower.  Do not sign any important legal documents or operate any machinery for 24 hours (because of the anesthesia used during the test).  NUTRITION Drink plenty of fluids.  You may resume your normal diet.  Begin with a light meal and progress to your normal diet.  Avoid alcoholic beverages for 24 hours or as instructed by your caregiver.  MEDICATIONS You may resume your normal medications unless your caregiver tells you otherwise.  WHAT YOU CAN EXPECT TODAY You may experience abdominal discomfort such as a feeling of fullness or "gas" pains.  FOLLOW-UP Your doctor will discuss the results of your test with you.  SEEK IMMEDIATE MEDICAL ATTENTION IF ANY OF THE FOLLOWING OCCUR: Excessive nausea (feeling sick to your stomach) and/or vomiting.  Severe abdominal pain and distention (swelling).  Trouble swallowing.  Temperature over 101 F (37.8 C).  Rectal bleeding or vomiting of blood.     Colonoscopy Discharge Instructions  Read the instructions outlined below and refer to this sheet in the next few weeks. These discharge instructions provide you with  general information on caring for yourself after you leave the hospital. Your doctor may also give you specific instructions. While your treatment has been planned according to the most current medical practices available, unavoidable complications occasionally occur.   ACTIVITY You may resume your regular activity, but move at a slower pace for the next 24 hours.  Take frequent rest periods for the next 24 hours.  Walking will help get rid of the air and reduce the bloated feeling in your belly (abdomen).  No driving for 24 hours (because of the medicine (anesthesia) used during the test).   Do not sign any important legal documents or operate any machinery for 24 hours (because of the anesthesia used during the test).  NUTRITION Drink plenty of fluids.  You may resume your normal diet as instructed by your doctor.  Begin with a light meal and progress to your normal diet. Heavy or fried foods are harder to digest and may make you feel sick to your stomach (nauseated).  Avoid alcoholic beverages for 24 hours or as instructed.  MEDICATIONS You may resume your normal medications unless your doctor tells you otherwise.  WHAT YOU CAN EXPECT TODAY Some feelings of bloating in the abdomen.  Passage of more gas than usual.  Spotting of blood in your stool or on the toilet paper.  IF YOU HAD POLYPS REMOVED DURING THE COLONOSCOPY: No aspirin products for 7 days or as instructed.  No alcohol for 7 days or as instructed.  Eat a soft diet for the next 24 hours.  FINDING OUT THE RESULTS OF YOUR TEST Not all test results are  available during your visit. If your test results are not back during the visit, make an appointment with your caregiver to find out the results. Do not assume everything is normal if you have not heard from your caregiver or the medical facility. It is important for you to follow up on all of your test results.  SEEK IMMEDIATE MEDICAL ATTENTION IF: You have more than a spotting of  blood in your stool.  Your belly is swollen (abdominal distention).  You are nauseated or vomiting.  You have a temperature over 101.  You have abdominal pain or discomfort that is severe or gets worse throughout the day.   Your EGD revealed mild amount inflammation in your stomach.  1 small ulcer.  I took biopsies of this to rule out infection with a bacteria called H. pylori.    You also had evidence of reflux esophagitis which I also took samples of.  I am going to start you on pantoprazole 40 mg twice daily for the next 12 weeks at which point you can decrease down to once daily.  I will send in nystatin swish and swallow for your thrush.  Await pathology results, my office will contact you.  Overall your colon was normal.  I did not find any polyps or evidence of colon cancer.  The end portion of your small bowel also appeared normal.  Follow-up in GI clinic in 2 to 3 months.   I hope you have a great rest of your week!  Elon Alas. Abbey Chatters, D.O. Gastroenterology and Hepatology [facility]

## 2022-08-29 NOTE — Telephone Encounter (Signed)
Call from [facility] in [place] regarding the pt's Rx for [employer] and that they did not do it there. Advised them to send it to [facility] in [place]. Call the pt's Sister-in -law advised that the Rx for the mouthwash had been transferred to [facility] so please answer the phone if they call. She expressed understanding.

## 2022-08-29 NOTE — Op Note (Signed)
[facility] Patient Name: Jennifer Rollins Procedure Date: 08/29/2022 10:50 AM MRN: [id number] Date of Birth: 03-27-1971 Attending MD: Elon Alas. Abbey Chatters , [place], [id number] CSN: [id number] Age: 52 Admit Type: Outpatient Procedure:                Colonoscopy Indications:              Periumbilical abdominal pain Providers:                Elon Alas. Abbey Chatters, DO, Janeece Riggers, RN, Ladoris Gene                            Technician, Technician Referring MD:              Medicines:                See the Anesthesia note for documentation of the                            administered medications Complications:            No immediate complications. Estimated Blood Loss:     Estimated blood loss: none. Procedure:                Pre-Anesthesia Assessment:                           - The anesthesia plan was to use monitored                            anesthesia care (MAC).                           After obtaining informed consent, the colonoscope                            was passed under direct vision. Throughout the                            procedure, the patient's blood pressure, pulse, and                            oxygen saturations were monitored continuously. The                            [id number]-[id number] scope was introduced through                            the anus and advanced to the the terminal ileum,                            with identification of the appendiceal orifice and                            IC valve. The colonoscopy was performed without                            difficulty. The patient tolerated the procedure  well. The quality of the bowel preparation was                            evaluated using the BBPS [facility] Bowel Preparation                            Scale) with scores of: Right Colon = 3, Transverse                            Colon = 3 and Left Colon = 3 (entire mucosa seen                            well with no  residual staining, small fragments of                            stool or opaque liquid). The total BBPS score                            equals 9. Scope In: 11:09:35 AM Scope Out: 11:22:44 AM Scope Withdrawal Time: 0 hours 9 minutes 2 seconds  Total Procedure Duration: 0 hours 13 minutes 9 seconds  Findings:      The perianal and digital rectal examinations were normal.      The colon (entire examined portion) appeared normal.      The terminal ileum appeared normal. Impression:               - The entire examined colon is normal.                           - The examined portion of the ileum was normal.                           - No specimens collected. Moderate Sedation:      Per Anesthesia Care Recommendation:           - Patient has a contact number available for                            emergencies. The signs and symptoms of potential                            delayed complications were discussed with the                            patient. Return to normal activities tomorrow.                            Written discharge instructions were provided to the                            patient.                           - Resume previous diet.                           -  Continue present medications.                           - Repeat colonoscopy in 10 years for screening                            purposes.                           - Return to GI clinic in 3 months. Procedure Code(s):        --- Professional ---                           [phone], Colonoscopy, flexible; diagnostic, including                            collection of specimen(s) by brushing or washing,                            when performed (separate procedure) Diagnosis Code(s):        --- Professional ---                           [id number], Periumbilical pain CPT copyright 2022 American Medical Association. All rights reserved. The codes documented in this report are preliminary and upon coder review may  be  revised to meet current compliance requirements. Elon Alas. Abbey Chatters, DO [place] Abbey Chatters, DO 08/29/2022 11:28:19 AM This report has been signed electronically. Number of Addenda: 0

## 2022-08-29 NOTE — Anesthesia Preprocedure Evaluation (Signed)
Anesthesia Evaluation  Patient identified by MRN, date of birth, ID band Patient awake    Reviewed: Allergy & Precautions, H&P , NPO status , Patient's Chart, lab work & pertinent test results, reviewed documented beta blocker date and time   Airway Mallampati: II  TM Distance: >3 FB Neck ROM: full    Dental no notable dental hx.    Pulmonary neg pulmonary ROS, sleep apnea , COPD, Current Smoker   Pulmonary exam normal breath sounds clear to auscultation       Cardiovascular Exercise Tolerance: Good negative cardio ROS  Rhythm:regular Rate:Normal     Neuro/Psych  PSYCHIATRIC DISORDERS Anxiety Depression     Neuromuscular disease negative neurological ROS  negative psych ROS   GI/Hepatic negative GI ROS, Neg liver ROS,GERD  ,,(+) Hepatitis -  Endo/Other  negative endocrine ROS    Renal/GU negative Renal ROS  negative genitourinary   Musculoskeletal   Abdominal   Peds  Hematology negative hematology ROS (+) Blood dyscrasia, anemia   Anesthesia Other Findings   Reproductive/Obstetrics negative OB ROS                             Anesthesia Physical Anesthesia Plan  ASA: 3  Anesthesia Plan: General   Post-op Pain Management:    Induction:   PONV Risk Score and Plan: Propofol infusion  Airway Management Planned:   Additional Equipment:   Intra-op Plan:   Post-operative Plan:   Informed Consent: I have reviewed the patients History and Physical, chart, labs and discussed the procedure including the risks, benefits and alternatives for the proposed anesthesia with the patient or authorized representative who has indicated his/her understanding and acceptance.     Dental Advisory Given  Plan Discussed with: CRNA  Anesthesia Plan Comments:        Anesthesia Quick Evaluation

## 2022-08-29 NOTE — Op Note (Signed)
[facility] Patient Name: Jennifer Rollins Procedure Date: 08/29/2022 10:49 AM MRN: [id number] Date of Birth: 01-16-1971 Attending MD: Elon Alas. Abbey Chatters , [place], [id number] CSN: [id number] Age: 52 Admit Type: Outpatient Procedure:                Upper GI endoscopy Indications:              Epigastric abdominal pain Providers:                Elon Alas. Abbey Chatters, DO, Janeece Riggers, RN, Ladoris Gene                            Technician, Technician Referring MD:              Medicines:                See the Anesthesia note for documentation of the                            administered medications Complications:            No immediate complications. Estimated Blood Loss:     Estimated blood loss was minimal. Procedure:                Pre-Anesthesia Assessment:                           - The anesthesia plan was to use monitored                            anesthesia care (MAC).                           After obtaining informed consent, the endoscope was                            passed under direct vision. Throughout the                            procedure, the patient's blood pressure, pulse, and                            oxygen saturations were monitored continuously. The                            [id number]) scope was introduced through the                            mouth, and advanced to the second part of duodenum.                            The upper GI endoscopy was accomplished without                            difficulty. The patient tolerated the procedure                            well. Scope In:  11:00:36 AM Scope Out: 11:04:50 AM Total Procedure Duration: 0 hours 4 minutes 14 seconds  Findings:      Mildly severe esophagitis with no bleeding was found at the       gastroesophageal junction. Biopsies were taken with a cold forceps for       histology.      Patchy mild inflammation characterized by erythema was found in the       gastric body and in the gastric  antrum. Biopsies were taken with a cold       forceps for Helicobacter pylori testing.      One non-bleeding cratered gastric ulcer with no stigmata of bleeding was       found in the gastric antrum. The lesion was 4 mm in largest dimension.      The duodenal bulb, first portion of the duodenum and second portion of       the duodenum were normal. Impression:               - Mildly severe reflux esophagitis with no                            bleeding. Biopsied.                           - Gastritis. Biopsied.                           - Non-bleeding gastric ulcer with no stigmata of                            bleeding.                           - Normal duodenal bulb, first portion of the                            duodenum and second portion of the duodenum. Moderate Sedation:      Per Anesthesia Care Recommendation:           - Patient has a contact number available for                            emergencies. The signs and symptoms of potential                            delayed complications were discussed with the                            patient. Return to normal activities tomorrow.                            Written discharge instructions were provided to the                            patient.                           - Resume previous diet.                           -  Continue present medications.                           - Await pathology results.                           - Return to GI clinic in 3 months.                           - Use Protonix (pantoprazole) 40 mg PO BID for 12                            weeks. Procedure Code(s):        --- Professional ---                           [phone], Esophagogastroduodenoscopy, flexible,                            transoral; with biopsy, single or multiple Diagnosis Code(s):        --- Professional ---                           K21.00, Gastro-esophageal reflux disease with                            esophagitis, without  bleeding                           K29.70, Gastritis, unspecified, without bleeding                           K25.9, Gastric ulcer, unspecified as acute or                            chronic, without hemorrhage or perforation                           R10.13, Epigastric pain CPT copyright 2022 American Medical Association. All rights reserved. The codes documented in this report are preliminary and upon coder review may  be revised to meet current compliance requirements. Elon Alas. Abbey Chatters, DO [place] Abbey Chatters, DO 08/29/2022 11:07:35 AM This report has been signed electronically. Number of Addenda: 0

## 2022-08-29 NOTE — Interval H&P Note (Signed)
History and Physical Interval Note:  08/29/2022 10:33 AM  Jennifer Rollins  has presented today for surgery, with the diagnosis of history of colon polyps,upper abd pain,early satiety.  The various methods of treatment have been discussed with the patient and family. After consideration of risks, benefits and other options for treatment, the patient has consented to  Procedure(s) with comments: COLONOSCOPY WITH PROPOFOL (N/A) - 11:30 am ESOPHAGOGASTRODUODENOSCOPY (EGD) WITH PROPOFOL (N/A) as a surgical intervention.  The patient's history has been reviewed, patient examined, no change in status, stable for surgery.  I have reviewed the patient's chart and labs.  Questions were answered to the patient's satisfaction.     [name]

## 2022-08-30 LAB — SURGICAL PATHOLOGY

## 2022-08-30 NOTE — Anesthesia Postprocedure Evaluation (Signed)
Anesthesia Post Note  Patient: Jennifer Rollins  Procedure(s) Performed: COLONOSCOPY WITH PROPOFOL ESOPHAGOGASTRODUODENOSCOPY (EGD) WITH PROPOFOL BIOPSY  Patient location during evaluation: Phase II Anesthesia Type: General Level of consciousness: awake Pain management: pain level controlled Vital Signs Assessment: post-procedure vital signs reviewed and stable Respiratory status: spontaneous breathing and respiratory function stable Cardiovascular status: blood pressure returned to baseline and stable Postop Assessment: no headache and no apparent nausea or vomiting Anesthetic complications: no Comments: Late entry   No notable events documented.   Last Vitals:  Vitals:   08/29/22 0907 08/29/22 1127  BP: 133/83 (!) 99/58  Pulse: 71 66  Resp: 10 (!) 22  Temp: 36.9 C 36.8 C  SpO2: 99% 98%    Last Pain:  Vitals:   08/29/22 1127  TempSrc: Axillary  PainSc: 0-No pain                 Windell Norfolk

## 2022-09-05 ENCOUNTER — Encounter (HOSPITAL_COMMUNITY): Payer: Self-pay | Admitting: Internal Medicine

## 2022-09-16 ENCOUNTER — Other Ambulatory Visit: Payer: Self-pay | Admitting: Family Medicine

## 2022-09-16 ENCOUNTER — Ambulatory Visit (INDEPENDENT_AMBULATORY_CARE_PROVIDER_SITE_OTHER): Payer: Medicare Other | Admitting: Family Medicine

## 2022-09-16 ENCOUNTER — Other Ambulatory Visit (HOSPITAL_COMMUNITY)
Admission: RE | Admit: 2022-09-16 | Discharge: 2022-09-16 | Disposition: A | Discharge status: Home / Self Care | Payer: Medicare Other | Source: Ambulatory Visit

## 2022-09-16 ENCOUNTER — Ambulatory Visit (HOSPITAL_COMMUNITY)
Admission: RE | Admit: 2022-09-16 | Discharge: 2022-09-16 | Disposition: A | Discharge status: Home / Self Care | Payer: Medicare Other | Source: Ambulatory Visit | Attending: Family Medicine | Admitting: Family Medicine

## 2022-09-16 ENCOUNTER — Encounter: Payer: Self-pay | Admitting: Family Medicine

## 2022-09-16 VITALS — BP 124/75 | HR 70 | Temp 98.9°F | Ht 65.0 in | Wt 185.0 lb

## 2022-09-16 DIAGNOSIS — M25531 Pain in right wrist: Secondary | ICD-10-CM

## 2022-09-16 DIAGNOSIS — M79631 Pain in right forearm: Secondary | ICD-10-CM | POA: Diagnosis not present

## 2022-09-16 DIAGNOSIS — R300 Dysuria: Secondary | ICD-10-CM | POA: Insufficient documentation

## 2022-09-16 NOTE — Assessment & Plan Note (Signed)
Urinalysis, urine culture ordered- Awaiting results will follow up. May take OTC AZO for urinary pain relief. Explained to increase oral fluid intake. Drink 8 glasses of water daily.  Follow up for worsening or persistent symptoms. Patient verbalizes understanding regarding plan of care and all questions answered.

## 2022-09-16 NOTE — Progress Notes (Signed)
Patient Office Visit   Subjective   Patient ID: Jennifer Rollins, female    DOB: 1970/09/16  Age: 52 y.o. MRN: [id number]  CC:  Chief Complaint  Patient presents with   Dysuria    Pt c/o dysuria, vaginal itching x 5 days; pt has tried new scented toilet paper    HPI Jennifer Rollins 51 year old female, presents to the clinic for dysuria and right wrist pain. She  has a past medical history of Abnormal Pap smear of cervix, Allergy, Anemia, Anxiety, Arthritis, Cancer (2016), Cataract (2022), Chronic back pain, Chronic pelvic pain in female, COPD (chronic obstructive pulmonary disease), Depression, Fibromyalgia, GERD (gastroesophageal reflux disease) (1990), HPV in female, IBS (irritable bowel syndrome), NASH (nonalcoholic steatohepatitis), PTSD (post-traumatic stress disorder), Sciatica of right side, and Sleep apnea (2022).  Dysuria The current episode started 4 days ago and has been gradually worsening.The quality of the pain is described as burning and vaginal itching. The pain is at a severity of 4/10. The pain is mild. There has been no fever. She is sexually active. There is a history of pyelonephritis. Pertinent negatives include no discharge, flank pain, frequency, hematuria, hesitancy or urgency. She has tried sitz baths for the symptoms. The treatment provided mild relief. Her past medical history is significant for kidney stones and recurrent UTIs.   Right forearm pain This is a chronic problem,  Patient reported surgery 3 year ago for bone spur repair. Patient stated pain occurs constantly and  has been gradually worsening since onset. The quality of the pain is described as aching. The pain is at a severity of 8/10. The pain is the same all the time. Stiffness is present In the morning. Associated symptoms include weakness, movement with pain. Pertinent negatives include no fever, numbness or tingling. She has tried NSAIDs for the symptoms. The treatment provided no relief.         Outpatient Encounter Medications as of 09/16/2022  Medication Sig   albuterol (VENTOLIN HFA) 108 (90 Base) MCG/ACT inhaler Inhale 2 puffs into the lungs every 6 (six) hours as needed for wheezing or shortness of breath.   Budeson-Glycopyrrol-Formoterol (BREZTRI AEROSPHERE) 160-9-4.8 MCG/ACT AERO Inhale 2 puffs into the lungs 2 (two) times daily. (Patient taking differently: Inhale 2 puffs into the lungs 2 (two) times daily as needed (shortness breath or wheezing).)   Cholecalciferol (VITAMIN D3) 20 MCG (800 UNIT) TABS Take 1 tablet by mouth daily.   Docusate Sodium (DSS) 100 MG CAPS Take 100 mg by mouth daily as needed (constipation).   DULoxetine (CYMBALTA) 30 MG capsule Take 2 capsules (60 mg total) by mouth daily.   ibuprofen (ADVIL) 800 MG tablet Take 800 mg by mouth every 8 (eight) hours as needed for moderate pain.   pantoprazole (PROTONIX) 40 MG tablet Take 1 tablet (40 mg total) by mouth 2 (two) times daily.   traZODone (DESYREL) 150 MG tablet Take 1-2 tablets (150-300 mg total) by mouth at bedtime.   gabapentin (NEURONTIN) 300 MG capsule Take 1 capsule (300 mg total) by mouth 3 (three) times daily. (Patient not taking: Reported on 08/23/2022)   [DISCONTINUED] dicyclomine (BENTYL) 20 MG tablet Take one every 6 hours for abd cramps (Patient not taking: Reported on 09/30/2015)   No facility-administered encounter medications on file as of 09/16/2022.    Past Surgical History:  Procedure Laterality Date   ABDOMINAL HYSTERECTOMY N/A 09/14/2014   Procedure: HYSTERECTOMY ABDOMINAL;  Surgeon: Lazaro Arms, MD;  Location: [facility];  Service: Gynecology;  Laterality: N/A;   BIOPSY  08/29/2022   Procedure: BIOPSY;  Surgeon: Lanelle Bal, DO;  Location: [facility];  Service: Endoscopy;;   CESAREAN SECTION     CHOLECYSTECTOMY     COLONOSCOPY WITH PROPOFOL N/A 08/29/2022   Procedure: COLONOSCOPY WITH PROPOFOL;  Surgeon: Lanelle Bal, DO;  Location: [facility];  Service:  Endoscopy;  Laterality: N/A;  11:30 am   COLPOSCOPY VULVA W/ BIOPSY     ESOPHAGOGASTRODUODENOSCOPY (EGD) WITH PROPOFOL N/A 08/29/2022   Procedure: ESOPHAGOGASTRODUODENOSCOPY (EGD) WITH PROPOFOL;  Surgeon: Lanelle Bal, DO;  Location: [facility];  Service: Endoscopy;  Laterality: N/A;   EXCISIONAL HEMORRHOIDECTOMY     FRACTURE SURGERY  2022   Right forearm   right forearm surgery Right    SALPINGOOPHORECTOMY Bilateral 09/14/2014   Procedure: SALPINGO OOPHORECTOMY;  Surgeon: Lazaro Arms, MD;  Location: [facility];  Service: Gynecology;  Laterality: Bilateral;   TUBAL LIGATION      Review of Systems  Constitutional:  Negative for chills and fever.  HENT:  Negative for hearing loss.   Eyes:  Negative for blurred vision.  Respiratory:  Negative for cough.   Cardiovascular:  Negative for chest pain.  Gastrointestinal:  Negative for abdominal pain and vomiting.  Genitourinary:  Positive for dysuria. Negative for flank pain, frequency, hematuria and urgency.  Musculoskeletal:  Positive for myalgias.  Skin:  Negative for rash.  Neurological:  Negative for dizziness.      Objective    BP 124/75   Pulse 70   Temp 98.9 F (37.2 C) (Oral)   Ht  (1.651 m)   Wt 185 lb (83.9 kg)   LMP 08/19/2014   SpO2 95%   BMI 30.79 kg/m   Physical Exam Vitals reviewed.  Constitutional:      General: She is not in acute distress.    Appearance: Normal appearance. She is not ill-appearing, toxic-appearing or diaphoretic.  HENT:     Head: Normocephalic.  Eyes:     General:        Right eye: No discharge.        Left eye: No discharge.     Conjunctiva/sclera: Conjunctivae normal.  Cardiovascular:     Rate and Rhythm: Normal rate.     Pulses: Normal pulses.     Heart sounds: Normal heart sounds.  Pulmonary:     Effort: Pulmonary effort is normal. No respiratory distress.     Breath sounds: Normal breath sounds.  Musculoskeletal:     Right wrist: Swelling present. Decreased range of  motion. Normal pulse.     Left wrist: Normal. Normal pulse.     Cervical back: Normal range of motion.  Skin:    General: Skin is warm and dry.     Capillary Refill: Capillary refill takes less than 2 seconds.  Neurological:     General: No focal deficit present.     Mental Status: She is alert and oriented to person, place, and time.     Coordination: Coordination normal.     Gait: Gait normal.  Psychiatric:        Mood and Affect: Mood normal.        Thought Content: Thought content normal.       Assessment & Plan:  Right wrist pain -     DG Forearm Right; Future  Dysuria Assessment & Plan: Urinalysis, urine culture ordered- Awaiting results will follow up. May take OTC AZO for urinary pain relief. Explained to increase  oral fluid intake. Drink 8 glasses of water daily.  Follow up for worsening or persistent symptoms. Patient verbalizes understanding regarding plan of care and all questions answered.   Orders: -     Urine Culture -     Urinalysis, Routine w reflex microscopic -     Urine cytology ancillary only  Right forearm pain Assessment & Plan: Xray ordered.  Explained to patient Non pharmacological interventions include the use of ice or heat, rest, recommend range of motion exercises, gentle stretching. The use of NSAIDs for pain management.  Follow up for worsening or persistent symptoms. Patient verbalizes understanding regarding plan of care and all questions answered.      Return in about 3 months (around 12/16/2022), or if symptoms worsen or fail to improve, for chronic follow-up, routine labs.   Cruzita Lederer Newman Nip, FNP

## 2022-09-16 NOTE — Patient Instructions (Addendum)
Return in about 3 months (around 12/16/2022) for chronic follow-up, routine labs.        Great to see you today.  I have refilled the medication(s) we provide.   If labs were collected, we will inform you of lab results once received either by echart message or telephone call.   - echart message- for normal results that have been seen by the patient already.   - telephone call: abnormal results or if patient has not viewed results in their echart.     Urinary Tract Infection, Adult A urinary tract infection (UTI) is an infection of any part of the urinary tract. The urinary tract includes: The kidneys. The ureters. The bladder. The urethra. These organs make, store, and get rid of pee (urine) in the body. What are the causes? This infection is caused by germs (bacteria) in your genital area. These germs grow and cause swelling (inflammation) of your urinary tract. What increases the risk? The following factors may make you more likely to develop this condition: Using a small, thin tube (catheter) to drain pee. Not being able to control when you pee or poop (incontinence). Being female. If you are female, these things can increase the risk: Using these methods to prevent pregnancy: A medicine that kills sperm (spermicide). A device that blocks sperm (diaphragm). Having low levels of a female hormone (estrogen). Being pregnant. You are more likely to develop this condition if: You have genes that add to your risk. You are sexually active. You take antibiotic medicines. You have trouble peeing because of: A prostate that is bigger than normal, if you are female. A blockage in the part of your body that drains pee from the bladder. A kidney stone. A nerve condition that affects your bladder. Not getting enough to drink. Not peeing often enough. You have other conditions, such as: Diabetes. A weak disease-fighting system (immune system). Sickle cell disease. Gout. Injury  of the spine. What are the signs or symptoms? Symptoms of this condition include: Needing to pee right away. Peeing small amounts often. Pain or burning when peeing. Blood in the pee. Pee that smells bad or not like normal. Trouble peeing. Pee that is cloudy. Fluid coming from the vagina, if you are female. Pain in the belly or lower back. Other symptoms include: Vomiting. Not feeling hungry. Feeling mixed up (confused). This may be the first symptom in older adults. Being tired and grouchy (irritable). A fever. Watery poop (diarrhea). How is this treated? Taking antibiotic medicine. Taking other medicines. Drinking enough water. In some cases, you may need to see a specialist. Follow these instructions at home:  Medicines Take over-the-counter and prescription medicines only as told by your doctor. If you were prescribed an antibiotic medicine, take it as told by your doctor. Do not stop taking it even if you start to feel better. General instructions Make sure you: Pee until your bladder is empty. Do not hold pee for a long time. Empty your bladder after sex. Wipe from front to back after peeing or pooping if you are a female. Use each tissue one time when you wipe. Drink enough fluid to keep your pee pale yellow. Keep all follow-up visits. Contact a doctor if: You do not get better after 1-2 days. Your symptoms go away and then come back. Get help right away if: You have very bad back pain. You have very bad pain in your lower belly. You have a fever. You have chills. You feeling like you  will vomit or you vomit. Summary A urinary tract infection (UTI) is an infection of any part of the urinary tract. This condition is caused by germs in your genital area. There are many risk factors for a UTI. Treatment includes antibiotic medicines. Drink enough fluid to keep your pee pale yellow. This information is not intended to replace advice given to you by your health  care provider. Make sure you discuss any questions you have with your health care provider. Document Revised: 12/24/2019 Document Reviewed: 12/24/2019 Elsevier Patient Education  2023 [employer].

## 2022-09-16 NOTE — Assessment & Plan Note (Signed)
Xray ordered.  Explained to patient Non pharmacological interventions include the use of ice or heat, rest, recommend range of motion exercises, gentle stretching. The use of NSAIDs for pain management.  Follow up for worsening or persistent symptoms. Patient verbalizes understanding regarding plan of care and all questions answered.  

## 2022-09-17 LAB — URINALYSIS, ROUTINE W REFLEX MICROSCOPIC
Bilirubin, UA: NEGATIVE
Glucose, UA: NEGATIVE
Ketones, UA: NEGATIVE
Leukocytes,UA: NEGATIVE
Nitrite, UA: NEGATIVE
Protein,UA: NEGATIVE
RBC, UA: NEGATIVE
Specific Gravity, UA: 1.018 (ref 1.005–1.030)
Urobilinogen, Ur: 1 mg/dL (ref 0.2–1.0)
pH, UA: 5.5 (ref 5.0–7.5)

## 2022-09-18 LAB — URINE CULTURE: Organism ID, Bacteria: NO GROWTH

## 2022-09-18 LAB — URINE CYTOLOGY ANCILLARY ONLY
Bacterial Vaginitis-Urine: NEGATIVE
Candida Urine: NEGATIVE
Chlamydia: NEGATIVE
Comment: NEGATIVE
Comment: NEGATIVE
Comment: NORMAL
Neisseria Gonorrhea: NEGATIVE
Trichomonas: NEGATIVE

## 2022-09-25 ENCOUNTER — Encounter: Payer: Self-pay | Admitting: Orthopedic Surgery

## 2022-09-26 ENCOUNTER — Encounter: Payer: Self-pay | Admitting: Orthopedic Surgery

## 2022-09-26 ENCOUNTER — Ambulatory Visit (INDEPENDENT_AMBULATORY_CARE_PROVIDER_SITE_OTHER): Payer: Medicare Other | Admitting: Orthopedic Surgery

## 2022-09-26 VITALS — BP 119/74 | HR 73 | Ht 65.0 in | Wt 187.6 lb

## 2022-09-26 DIAGNOSIS — S52501S Unspecified fracture of the lower end of right radius, sequela: Secondary | ICD-10-CM

## 2022-09-26 DIAGNOSIS — M25531 Pain in right wrist: Secondary | ICD-10-CM
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
## 2022-09-26 NOTE — Addendum Note (Signed)
Addended by: Baird Kay on: 09/26/2022 08:58 AM   Modules accepted: Orders

## 2022-10-01 DIAGNOSIS — S52501S Unspecified fracture of the lower end of right radius, sequela: Secondary | ICD-10-CM | POA: Diagnosis not present

## 2022-10-01 DIAGNOSIS — M25531 Pain in right wrist: Secondary | ICD-10-CM | POA: Diagnosis not present

## 2022-10-01 DIAGNOSIS — M19131 Post-traumatic osteoarthritis, right wrist: Secondary | ICD-10-CM | POA: Diagnosis not present

## 2022-10-01 DIAGNOSIS — S52601S Unspecified fracture of lower end of right ulna, sequela: Secondary | ICD-10-CM | POA: Diagnosis not present

## 2022-10-04 ENCOUNTER — Telehealth: Payer: Self-pay | Admitting: Family Medicine

## 2022-10-04 NOTE — Telephone Encounter (Signed)
Called patient to schedule Medicare Annual Wellness Visit (AWV). Left message for patient to call back and schedule Medicare Annual Wellness Visit (AWV).  Last date of AWV: due 07/25/2021 awvi per [facility]  Please schedule an appointment at any time with Abby, NHA. .  If any questions, please contact me at [phone].  Thank you,  Judeth Cornfield,  AMB Clinical Support [facility] AWV Program Direct Dial ??[phone]

## 2022-10-22 DIAGNOSIS — J449 Chronic obstructive pulmonary disease, unspecified: Secondary | ICD-10-CM | POA: Diagnosis not present

## 2022-10-29 ENCOUNTER — Encounter: Payer: Self-pay | Admitting: Internal Medicine

## 2022-11-06 ENCOUNTER — Other Ambulatory Visit: Payer: Self-pay | Admitting: Family Medicine

## 2022-11-07 ENCOUNTER — Encounter: Payer: Self-pay | Admitting: Family Medicine

## 2022-11-07 ENCOUNTER — Ambulatory Visit: Payer: Medicare Other | Admitting: Internal Medicine

## 2022-11-07 ENCOUNTER — Ambulatory Visit (INDEPENDENT_AMBULATORY_CARE_PROVIDER_SITE_OTHER): Payer: 59 | Admitting: Family Medicine

## 2022-11-07 VITALS — BP 125/78 | HR 79 | Ht 65.0 in | Wt 187.0 lb

## 2022-11-07 DIAGNOSIS — Z131 Encounter for screening for diabetes mellitus: Secondary | ICD-10-CM

## 2022-11-07 DIAGNOSIS — E559 Vitamin D deficiency, unspecified: Secondary | ICD-10-CM

## 2022-11-07 DIAGNOSIS — R5383 Other fatigue: Secondary | ICD-10-CM | POA: Insufficient documentation

## 2022-11-07 DIAGNOSIS — J449 Chronic obstructive pulmonary disease, unspecified: Secondary | ICD-10-CM | POA: Diagnosis not present

## 2022-11-07 DIAGNOSIS — G4700 Insomnia, unspecified: Secondary | ICD-10-CM

## 2022-11-07 DIAGNOSIS — Z1231 Encounter for screening mammogram for malignant neoplasm of breast: Secondary | ICD-10-CM

## 2022-11-07 DIAGNOSIS — Z13220 Encounter for screening for lipoid disorders: Secondary | ICD-10-CM

## 2022-11-07 DIAGNOSIS — R5382 Chronic fatigue, unspecified: Secondary | ICD-10-CM

## 2022-11-07 DIAGNOSIS — E538 Deficiency of other specified B group vitamins: Secondary | ICD-10-CM | POA: Diagnosis not present

## 2022-11-07 DIAGNOSIS — Z122 Encounter for screening for malignant neoplasm of respiratory organs: Secondary | ICD-10-CM

## 2022-11-07 DIAGNOSIS — E669 Obesity, unspecified: Secondary | ICD-10-CM

## 2022-11-07 MED ORDER — LEVOCETIRIZINE DIHYDROCHLORIDE 5 MG PO TABS: 5.0000 mg | ORAL_TABLET | Freq: Every evening | ORAL | 3 refills | Status: DC

## 2022-11-07 MED ORDER — TRAZODONE HCL 150 MG PO TABS
150.0000 mg | ORAL_TABLET | Freq: Every day | ORAL | 3 refills | Status: DC
Start: 2022-11-07 — End: 2023-08-25

## 2022-11-07 MED ORDER — ALBUTEROL SULFATE HFA 108 (90 BASE) MCG/ACT IN AERS
2.0000 | INHALATION_SPRAY | Freq: Four times a day (QID) | RESPIRATORY_TRACT | 3 refills | Status: DC | PRN
Start: 2022-11-07 — End: 2023-08-11

## 2022-11-07 MED ORDER — PANTOPRAZOLE SODIUM 40 MG PO TBEC: 40.0000 mg | DELAYED_RELEASE_TABLET | Freq: Two times a day (BID) | ORAL | 3 refills | Status: DC

## 2022-11-07 MED ORDER — DULOXETINE HCL 30 MG PO CPEP: 60.0000 mg | ORAL_CAPSULE | Freq: Every day | ORAL | 3 refills | Status: DC

## 2022-11-07 NOTE — Assessment & Plan Note (Signed)
Discussed the importance to start eating 3 meals a day including breakfast, drink 8 glasses of water a day ,reduce portion sizes. reduced carbohydrates limit saturated and trans fat, increase servings of vegetables and limit processed foods. Find an activity that you will enjoy and start to be active at least 5 days a week for 30 minutes each day. Keep a food journal or an activity journal to identify triggers that lead to emotional eating  

## 2022-11-07 NOTE — Progress Notes (Signed)
Patient Office Visit   Subjective   Patient ID: Delynda Sheckler, female    DOB: [birth year]  Age: 52 y.o. MRN: [id number]  CC:  Chief Complaint  Patient presents with   Follow-up    Patient is here for chronic f/u. Wants traz, celexa and protonix refilled. Complains of fatigue for a couple weeks.     HPI Eufemia Herlong 52 year old female, presents to the clinic for chronic follow up. She  has a past medical history of Abnormal Pap smear of cervix, Allergy, Anemia, Anxiety, Arthritis, Benign essential hypertension (06/14/2020), Cancer (HCC) (2016), Cataract (2022), Chronic back pain, Chronic pelvic pain in female, COPD (chronic obstructive pulmonary disease) (HCC), Depression, Fibromyalgia, GERD (gastroesophageal reflux disease) (1990), HPV in female, Hypothyroidism (02/25/2020), IBS (irritable bowel syndrome), NASH (nonalcoholic steatohepatitis), PTSD (post-traumatic stress disorder), Sciatica of right side, and Sleep apnea (2022).For the details of today's visit, please refer to assessment and plan.   HPI    Outpatient Encounter Medications as of 11/07/2022  Medication Sig   Budeson-Glycopyrrol-Formoterol (BREZTRI AEROSPHERE) 160-9-4.8 MCG/ACT AERO Inhale 2 puffs into the lungs 2 (two) times daily. (Patient taking differently: Inhale 2 puffs into the lungs 2 (two) times daily as needed (shortness breath or wheezing).)   Cholecalciferol (VITAMIN D3) 20 MCG (800 UNIT) TABS Take 1 tablet by mouth daily.   Docusate Sodium (DSS) 100 MG CAPS Take 100 mg by mouth daily as needed (constipation).   ibuprofen (ADVIL) 800 MG tablet Take 800 mg by mouth every 8 (eight) hours as needed for moderate pain.   levocetirizine (XYZAL) 5 MG tablet Take 1 tablet (5 mg total) by mouth every evening.   [DISCONTINUED] albuterol (VENTOLIN HFA) 108 (90 Base) MCG/ACT inhaler Inhale 2 puffs into the lungs every 6 (six) hours as needed for wheezing or shortness of breath.   [DISCONTINUED] DULoxetine (CYMBALTA) 30 MG  capsule Take 2 capsules (60 mg total) by mouth daily.   [DISCONTINUED] pantoprazole (PROTONIX) 40 MG tablet Take 1 tablet (40 mg total) by mouth 2 (two) times daily.   [DISCONTINUED] traZODone (DESYREL) 150 MG tablet Take 1-2 tablets (150-300 mg total) by mouth at bedtime.   albuterol (VENTOLIN HFA) 108 (90 Base) MCG/ACT inhaler Inhale 2 puffs into the lungs every 6 (six) hours as needed for wheezing or shortness of breath.   DULoxetine (CYMBALTA) 30 MG capsule Take 2 capsules (60 mg total) by mouth daily.   pantoprazole (PROTONIX) 40 MG tablet Take 1 tablet (40 mg total) by mouth 2 (two) times daily.   traZODone (DESYREL) 150 MG tablet Take 1-2 tablets (150-300 mg total) by mouth at bedtime.   [DISCONTINUED] dicyclomine (BENTYL) 20 MG tablet Take one every 6 hours for abd cramps (Patient not taking: Reported on 09/30/2015)   No facility-administered encounter medications on file as of 11/07/2022.    Past Surgical History:  Procedure Laterality Date   ABDOMINAL HYSTERECTOMY N/A 09/14/2014   Procedure: HYSTERECTOMY ABDOMINAL;  Surgeon: Lazaro Arms, MD;  Location: [facility];  Service: Gynecology;  Laterality: N/A;   BIOPSY  08/29/2022   Procedure: BIOPSY;  Surgeon: Lanelle Bal, DO;  Location: [facility];  Service: Endoscopy;;   CESAREAN SECTION     CHOLECYSTECTOMY     COLONOSCOPY WITH PROPOFOL N/A 08/29/2022   Procedure: COLONOSCOPY WITH PROPOFOL;  Surgeon: Lanelle Bal, DO;  Location: [facility];  Service: Endoscopy;  Laterality: N/A;  11:30 am   COLPOSCOPY VULVA W/ BIOPSY     ESOPHAGOGASTRODUODENOSCOPY (EGD) WITH PROPOFOL N/A  08/29/2022   Procedure: ESOPHAGOGASTRODUODENOSCOPY (EGD) WITH PROPOFOL;  Surgeon: Lanelle Bal, DO;  Location: [facility];  Service: Endoscopy;  Laterality: N/A;   EXCISIONAL HEMORRHOIDECTOMY     FRACTURE SURGERY  2022   Right forearm   right forearm surgery Right    SALPINGOOPHORECTOMY Bilateral 09/14/2014   Procedure: SALPINGO OOPHORECTOMY;   Surgeon: Lazaro Arms, MD;  Location: [facility];  Service: Gynecology;  Laterality: Bilateral;   TUBAL LIGATION      Review of Systems  Constitutional:  Negative for chills and fever.  HENT:  Negative for ear pain.   Eyes:  Negative for pain.  Respiratory:  Negative for cough.   Cardiovascular:  Negative for chest pain.  Gastrointestinal:  Negative for diarrhea, nausea and vomiting.  Genitourinary:  Negative for dysuria.  Skin:  Negative for rash.  Neurological:  Negative for dizziness, tingling and speech change.      Objective    BP 125/78   Pulse 79   Ht 5\' 5"  (1.651 m)   Wt 187 lb (84.8 kg)   LMP 08/19/2014   SpO2 97%   BMI 31.12 kg/m   Physical Exam Vitals reviewed.  Constitutional:      General: She is not in acute distress.    Appearance: Normal appearance. She is not ill-appearing, toxic-appearing or diaphoretic.  HENT:     Head: Normocephalic.  Eyes:     General:        Right eye: No discharge.        Left eye: No discharge.     Conjunctiva/sclera: Conjunctivae normal.  Cardiovascular:     Rate and Rhythm: Normal rate.     Pulses: Normal pulses.     Heart sounds: Normal heart sounds.  Pulmonary:     Effort: Pulmonary effort is normal. No respiratory distress.     Breath sounds: Normal breath sounds.  Abdominal:     General: Bowel sounds are normal.     Palpations: Abdomen is soft.     Tenderness: There is no abdominal tenderness. There is no right CVA tenderness, left CVA tenderness or guarding.  Musculoskeletal:        General: Normal range of motion.     Cervical back: Normal range of motion.  Skin:    General: Skin is warm and dry.     Capillary Refill: Capillary refill takes less than 2 seconds.  Neurological:     General: No focal deficit present.     Mental Status: She is alert and oriented to person, place, and time.     Coordination: Coordination normal.     Gait: Gait normal.  Psychiatric:        Mood and Affect: Mood normal.         Behavior: Behavior normal.       Assessment & Plan:  Screening for lung cancer -     Ambulatory Referral for Lung Cancer Scre -     Digital Screening Mammogram, Left and Right; Future  Encounter for screening mammogram for breast cancer -     Digital Screening Mammogram, Left and Right; Future  Screening for diabetes mellitus -     Microalbumin / creatinine urine ratio -     Hemoglobin A1c  Screening for lipid disorders -     Lipid panel -     CMP14+EGFR -     CBC with Differential/Platelet  Vitamin D deficiency -     VITAMIN D 25 Hydroxy (Vit-D Deficiency, Fractures) -  Vitamin B12  Vitamin B12 deficiency  Folate deficiency -     Folate  Insomnia, unspecified type -     traZODone HCl; Take 1-2 tablets (150-300 mg total) by mouth at bedtime.  Dispense: 90 tablet; Refill: 3  Chronic obstructive pulmonary disease, unspecified COPD type (HCC) -     Albuterol Sulfate HFA; Inhale 2 puffs into the lungs every 6 (six) hours as needed for wheezing or shortness of breath.  Dispense: 8 g; Refill: 3  Chronic fatigue Assessment & Plan: Labs Ordered Continued discussion on lifestyle changes, establishing a daily routine, going outdoors, exercise, healthy eating habits, mindfulness and mediatation.    Obesity with body mass index 30 or greater Assessment & Plan: Discussed the importance to start eating 3 meals a day including breakfast, drink 8 glasses of water a day ,reduce portion sizes. reduced carbohydrates limit saturated and trans fat, increase servings of vegetables and limit processed foods. Find an activity that you will enjoy and start to be active at least 5 days a week for 30 minutes each day. Keep a food journal or an activity journal to identify triggers that lead to emotional eating      Other orders -     Pantoprazole Sodium; Take 1 tablet (40 mg total) by mouth 2 (two) times daily.  Dispense: 60 tablet; Refill: 3 -     DULoxetine HCl; Take 2 capsules (60 mg  total) by mouth daily.  Dispense: 180 capsule; Refill: 3 -     Levocetirizine Dihydrochloride; Take 1 tablet (5 mg total) by mouth every evening.  Dispense: 30 tablet; Refill: 3    Return in about 4 months (around 03/09/2023), or Schedule AWV, for chronic follow-up, routine labs.   Cruzita Lederer Newman Nip, FNP

## 2022-11-07 NOTE — Patient Instructions (Signed)

## 2022-11-07 NOTE — Assessment & Plan Note (Signed)
Labs Ordered Continued discussion on lifestyle changes, establishing a daily routine, going outdoors, exercise, healthy eating habits, mindfulness and mediatation.

## 2022-11-08 LAB — CMP14+EGFR
ALT: 16 IU/L (ref 0–32)
AST: 18 IU/L (ref 0–40)
Albumin/Globulin Ratio: 1.4
Albumin: 4.2 g/dL (ref 3.8–4.9)
Alkaline Phosphatase: 222 IU/L — ABNORMAL HIGH (ref 44–121)
BUN/Creatinine Ratio: 10 (ref 9–23)
BUN: 10 mg/dL (ref 6–24)
Bilirubin Total: 0.4 mg/dL (ref 0.0–1.2)
CO2: 24 mmol/L (ref 20–29)
Calcium: 9.6 mg/dL (ref 8.7–10.2)
Chloride: 104 mmol/L (ref 96–106)
Creatinine, Ser: 1.05 mg/dL — ABNORMAL HIGH (ref 0.57–1.00)
Globulin, Total: 3.1 g/dL (ref 1.5–4.5)
Glucose: 83 mg/dL (ref 70–99)
Potassium: 4.7 mmol/L (ref 3.5–5.2)
Sodium: 142 mmol/L (ref 134–144)
Total Protein: 7.3 g/dL (ref 6.0–8.5)
eGFR: 64 mL/min/{1.73_m2} (ref >59)

## 2022-11-08 LAB — CBC WITH DIFFERENTIAL/PLATELET
Basophils Absolute: 0 10*3/uL (ref 0.0–0.2)
Basos: 1 %
EOS (ABSOLUTE): 0.1 10*3/uL (ref 0.0–0.4)
Eos: 1 %
Hematocrit: 43.2 % (ref 34.0–46.6)
Hemoglobin: 14 g/dL (ref 11.1–15.9)
Immature Grans (Abs): 0 10*3/uL (ref 0.0–0.1)
Immature Granulocytes: 0 %
Lymphocytes Absolute: 1.7 10*3/uL (ref 0.7–3.1)
Lymphs: 23 %
MCH: 28.1 pg (ref 26.6–33.0)
MCHC: 32.4 g/dL (ref 31.5–35.7)
MCV: 87 fL (ref 79–97)
Monocytes Absolute: 0.7 10*3/uL (ref 0.1–0.9)
Monocytes: 9 %
Neutrophils Absolute: 5 10*3/uL (ref 1.4–7.0)
Neutrophils: 66 %
Platelets: 386 10*3/uL (ref 150–450)
RBC: 4.99 x10E6/uL (ref 3.77–5.28)
RDW: 13.8 % (ref 11.7–15.4)
WBC: 7.5 10*3/uL (ref 3.4–10.8)

## 2022-11-08 LAB — HEMOGLOBIN A1C
Est. average glucose Bld gHb Est-mCnc: 131 mg/dL
Hgb A1c MFr Bld: 6.2 % — ABNORMAL HIGH (ref 4.8–5.6)

## 2022-11-08 LAB — LIPID PANEL
Chol/HDL Ratio: 4.8 ratio — ABNORMAL HIGH (ref 0.0–4.4)
Cholesterol, Total: 222 mg/dL — ABNORMAL HIGH (ref 100–199)
HDL: 46 mg/dL (ref >39)
LDL Chol Calc (NIH): 133 mg/dL — ABNORMAL HIGH (ref 0–99)
Triglycerides: 240 mg/dL — ABNORMAL HIGH (ref 0–149)
VLDL Cholesterol Cal: 43 mg/dL — ABNORMAL HIGH (ref 5–40)

## 2022-11-08 LAB — VITAMIN B12: Vitamin B-12: 351 pg/mL (ref 232–1245)

## 2022-11-08 LAB — VITAMIN D 25 HYDROXY (VIT D DEFICIENCY, FRACTURES): Vit D, 25-Hydroxy: 27 ng/mL — ABNORMAL LOW (ref 30.0–100.0)

## 2022-11-08 LAB — FOLATE: Folate: 2.5 ng/mL — ABNORMAL LOW (ref >3.0)

## 2022-11-14 ENCOUNTER — Encounter: Payer: Self-pay | Admitting: Family Medicine

## 2022-11-16 ENCOUNTER — Other Ambulatory Visit: Payer: Self-pay | Admitting: Family Medicine

## 2022-11-16 MED ORDER — ROSUVASTATIN CALCIUM 5 MG PO TABS: 5.0000 mg | ORAL_TABLET | Freq: Every day | ORAL | 3 refills | Status: DC

## 2022-11-20 ENCOUNTER — Telehealth: Payer: Self-pay | Admitting: Gastroenterology

## 2022-11-20 NOTE — Telephone Encounter (Signed)
Patient left a message that she was having spasms under her rib cage.  She has an appt 7/8 but wanted to see if she could be seen sooner.  I offered her times for today, 6/26, but she said she couldn't come in this afternoon.  She said she would wait until her appt and if the pain got worse she would call back or go to the emergency room.

## 2022-11-22 ENCOUNTER — Inpatient Hospital Stay (HOSPITAL_COMMUNITY): Admission: RE | Admit: 2022-11-22 | Payer: 59 | Source: Ambulatory Visit

## 2022-11-22 ENCOUNTER — Encounter (HOSPITAL_COMMUNITY): Payer: Self-pay

## 2022-11-22 DIAGNOSIS — J449 Chronic obstructive pulmonary disease, unspecified: Secondary | ICD-10-CM | POA: Diagnosis not present

## 2022-11-25 ENCOUNTER — Other Ambulatory Visit (INDEPENDENT_AMBULATORY_CARE_PROVIDER_SITE_OTHER): Payer: 59

## 2022-11-25 ENCOUNTER — Ambulatory Visit (INDEPENDENT_AMBULATORY_CARE_PROVIDER_SITE_OTHER): Payer: 59 | Admitting: Orthopedic Surgery

## 2022-11-25 VITALS — BP 117/77 | HR 75 | Ht 65.0 in | Wt 186.0 lb

## 2022-11-25 DIAGNOSIS — M25562 Pain in left knee: Secondary | ICD-10-CM

## 2022-11-25 DIAGNOSIS — S82102A Unspecified fracture of upper end of left tibia, initial encounter for closed fracture: Secondary | ICD-10-CM

## 2022-11-25 DIAGNOSIS — G8929 Other chronic pain: Secondary | ICD-10-CM | POA: Diagnosis not present

## 2022-11-25 DIAGNOSIS — S82102D Unspecified fracture of upper end of left tibia, subsequent encounter for closed fracture with routine healing: Secondary | ICD-10-CM

## 2022-11-25 MED ORDER — IBUPROFEN 800 MG PO TABS: 800.0000 mg | ORAL_TABLET | Freq: Three times a day (TID) | ORAL | 1 refills | Status: DC | PRN

## 2022-11-25 NOTE — Patient Instructions (Addendum)
Contact central scheduling [phone]  For pain   Take ibuprofen 800 mg 3 x a day   And   Tylenol 500 mg every 6hr as needed

## 2022-11-25 NOTE — Progress Notes (Unsigned)
Chief Complaint  Patient presents with   Knee Pain    Left, 6 weeks ago lost balance and fell   52 year old history of fall last year fracturing her proximal tibia: [place] about 6 wks ago when someone grabbed her hand trying to run from a mouse.  When they let go of her hand she fell.  She complains of medial knee pain.   Physical Exam Vitals and nursing note reviewed.  Constitutional:      Appearance: Normal appearance.  HENT:     Head: Normocephalic and atraumatic.  Eyes:     General: No scleral icterus.       Right eye: No discharge.        Left eye: No discharge.     Extraocular Movements: Extraocular movements intact.     Conjunctiva/sclera: Conjunctivae normal.     Pupils: Pupils are equal, round, and reactive to light.  Cardiovascular:     Rate and Rhythm: Normal rate.     Pulses: Normal pulses.  Musculoskeletal:     Left knee: No effusion.     Instability Tests: Medial McMurray test negative.  Skin:    General: Skin is warm and dry.     Capillary Refill: Capillary refill takes less than 2 seconds.  Neurological:     General: No focal deficit present.     Mental Status: She is alert and oriented to person, place, and time.  Psychiatric:        Mood and Affect: Mood normal.        Behavior: Behavior normal.        Thought Content: Thought content normal.        Judgment: Judgment normal.    Left Knee Exam   Muscle Strength  The patient has normal left knee strength.  Tenderness  Left knee tenderness location: prox. med. tibia.  Range of Motion  Extension:  normal  Flexion:  normal   Tests  McMurray:  Medial - negative  Drawer:  Anterior - negative     Posterior - negative  Other  Erythema: absent Sensation: normal Pulse: present Swelling: none Effusion: no effusion present      Imaging: 3 v left knee - no acute frx, mild OA   Encounter Diagnoses  Name Primary?   Acute pain of left knee Yes   Closed fracture of proximal end of left tibia,  unspecified fracture morphology, initial encounter/rule out     Plan:  MRI left knee   Take ibuprofen and tylenol for pain

## 2022-11-26 ENCOUNTER — Ambulatory Visit
Admission: RE | Admit: 2022-11-26 | Discharge: 2022-11-26 | Disposition: A | Discharge status: Home / Self Care | Payer: 59 | Source: Ambulatory Visit | Attending: Orthopedic Surgery | Admitting: Orthopedic Surgery

## 2022-11-26 DIAGNOSIS — M7989 Other specified soft tissue disorders: Secondary | ICD-10-CM | POA: Diagnosis not present

## 2022-11-26 DIAGNOSIS — M25562 Pain in left knee: Secondary | ICD-10-CM

## 2022-11-29 ENCOUNTER — Other Ambulatory Visit: Payer: 59

## 2022-11-30 NOTE — H&P (View-Only) (Signed)
Referring Provider: Wylene Men* Primary Care Physician:  Rica Records, FNP Primary GI Physician: Dr. Marletta Lor  Chief Complaint  Patient presents with   Follow-up    Follow up elevated alkaline phosphate and constipation. Stomach is bloated      HPI:   Jennifer Rollins is a 52 y.o. female presenting today with a history of   Last seen in our office at the time of initial consult 08/08/2022 for elevated alkaline phosphatase, upper abdominal pain/early satiety, weight loss.  She reported 2 years of elevated alkaline phosphatase with extensive evaluation with [facility] in [facility] with no definitive diagnosis and not currently on any medications.  Her alk phos was elevated at 272 on recent labs in March.  Plan to request records with additional recommendations to follow.  She also reported 30 pound unintentional weight loss, postprandial epigastric and left upper abdominal pain, early satiety over the last year.  Stated upper GI symptoms started after having significant illness 1 year ago.  She also reported history of colon polyps.  Recommended EGD, colonoscopy, request records.   Records from [facility] gastroenterology. No labs included regarding workup for elevated alkaline phosphatase.   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  Comment states differential includes nonhepatic tropic viral infection, drug or toxin induced liver injury, exposure to chemicals or solvents.   MRI abdomen with and without contrast 08/17/2021: Mildly enlarged liver, portions of the liver capsule slightly nodular, cannot exclude early changes of cirrhosis.  Normal spleen size.  No ascites.  No adenopathy.  No suspicious enhancing liver mass.  Variant cecal anatomy (located in  the midline)   EGD 04/10/2021: Irregularity in the Z-line and GE junction biopsied, congestion and erythema in the antrum biopsied, normal examined duodenum biopsied.  Z-line/GE junction pathology with mild chronic inflammation, gastric biopsy with minimal chronic gastritis, negative for H. pylori, duodenal biopsy with intact villous architecture.   Colonoscopy 04/10/2021: Normal colonic and ileal mucosa s/p multiple biopsies including biopsy of TI, grade 1 internal hemorrhoids.  TI biopsy with focal active colitis with granuloma formation, favor involvement by Crohn's, biopsy of the ascending colon, transverse colon, descending colon, sigmoid colon, normal.    Recommended updating labs and ultrasound.  Completed 08/12/2022: Hepatitis A, B, and C labs were negative.  Recommended vaccination for hepatitis A and B. ANA, AMA, ASMA, IgG, IgM all within normal limits.  Mild elevation of IgA at 555. Alk phos 244 with liver fraction 70%, bone fraction 30%.    Abdominal ultrasound with elastography is 08/26/2022: Increased hepatic echogenicity, no definite nodularity, normal spleen, median K PA 4.3.  Procedures completed 08/29/2022: Colonoscopy: Normal exam.  Repeat in 10 years. EGD: Severe reflux esophagitis biopsied, gastritis biopsied, nonbleeding gastric ulcer with no stigmata of bleeding, normal examined duodenum.  Gastric biopsies benign, no H. pylori.  Esophageal biopsies benign.  She was started on pantoprazole 40 mg twice daily.   Today: Chronic constipation. Continue with postprandial epigastric and LUQ abdominal pain and bloating. Associated nausea without vomiting. Doesn't matter what she eats. Feels like something catching in RUQ. Can't sleep on her right side. This is chronic. Doesn't drink much water. Drinking soda and tea. No typical heartburn symptoms.   Took pantoprazole 40 mg BID x 12 weeks, now taking daily.  States that she did feel  some  better when taking pantoprazole twice  today.  NSAIDs: Ibuprofen- 800 per day 3-4 times per week due to fibromyalgia. No tylenol.   Chronic constipation. Some cramping prior to a BM. BM every 3-4 days. Taking 2 stool softeners BID. Has to strain. Has tried MiraLAX prn, not daily.  Previously on Linzess 290 mcg, but this was too strong to take daily.  Elevated alkaline phosphatase: OTC supplements: Fish oil, folate, vitamin D.   Weight loss: Slowly gaining weight back. Up 3 lbs since her last visit.      Past Medical History:  Diagnosis Date   Abnormal Pap smear of cervix    Allergy    Anemia    Anxiety    Arthritis    Benign essential hypertension 06/14/2020   Cancer (HCC) 2016   Cervical   Cataract 2022   Chronic back pain    Chronic pelvic pain in female    COPD (chronic obstructive pulmonary disease) (HCC)    Depression    Fibromyalgia    GERD (gastroesophageal reflux disease) 1990   HPV in female    Hypothyroidism 02/25/2020   IBS (irritable bowel syndrome)    NASH (nonalcoholic steatohepatitis)    PTSD (post-traumatic stress disorder)    Sciatica of right side    Sleep apnea 2022    Past Surgical History:  Procedure Laterality Date   ABDOMINAL HYSTERECTOMY N/A 09/14/2014   Procedure: HYSTERECTOMY ABDOMINAL;  Surgeon: Lazaro Arms, MD;  Location: [facility];  Service: Gynecology;  Laterality: N/A;   BIOPSY  08/29/2022   Procedure: BIOPSY;  Surgeon: Lanelle Bal, DO;  Location: [facility];  Service: Endoscopy;;   CESAREAN SECTION     CHOLECYSTECTOMY     COLONOSCOPY WITH PROPOFOL N/A 08/29/2022   Procedure: COLONOSCOPY WITH PROPOFOL;  Surgeon: Lanelle Bal, DO;  Location: [facility];  Service: Endoscopy;  Laterality: N/A;  11:30 am   COLPOSCOPY VULVA W/ BIOPSY     ESOPHAGOGASTRODUODENOSCOPY (EGD) WITH PROPOFOL N/A 08/29/2022   Procedure: ESOPHAGOGASTRODUODENOSCOPY (EGD) WITH PROPOFOL;  Surgeon: Lanelle Bal, DO;  Location: [facility];  Service: Endoscopy;  Laterality: N/A;   EXCISIONAL  HEMORRHOIDECTOMY     FRACTURE SURGERY  2022   Right forearm   right forearm surgery Right    SALPINGOOPHORECTOMY Bilateral 09/14/2014   Procedure: SALPINGO OOPHORECTOMY;  Surgeon: Lazaro Arms, MD;  Location: [facility];  Service: Gynecology;  Laterality: Bilateral;   TUBAL LIGATION      Current Outpatient Medications  Medication Sig Dispense Refill   albuterol (VENTOLIN HFA) 108 (90 Base) MCG/ACT inhaler Inhale 2 puffs into the lungs every 6 (six) hours as needed for wheezing or shortness of breath. 8 g 3   Budeson-Glycopyrrol-Formoterol (BREZTRI AEROSPHERE) 160-9-4.8 MCG/ACT AERO Inhale 2 puffs into the lungs 2 (two) times daily. (Patient taking differently: Inhale 2 puffs into the lungs 2 (two) times daily as needed (shortness breath or wheezing).) 10.7 g 11   Cholecalciferol (VITAMIN D3) 20 MCG (800 UNIT) TABS Take 1 tablet by mouth daily. 75 tablet 1   Docusate Sodium (DSS) 100 MG CAPS Take 100 mg by mouth daily as needed (constipation).     DULoxetine (CYMBALTA) 30 MG capsule Take 2 capsules (60 mg total) by mouth daily. 180 capsule 3   ibuprofen (ADVIL) 800 MG tablet Take 1 tablet (800 mg total) by mouth every 8 (eight) hours as needed. 90 tablet 1   levocetirizine (XYZAL) 5 MG tablet Take 1 tablet (5 mg total) by  mouth every evening. 30 tablet 3   Omega-3 Fatty Acids (FISH OIL) 300 MG CAPS Take by mouth.     rosuvastatin (CRESTOR) 5 MG tablet Take 1 tablet (5 mg total) by mouth daily. 90 tablet 3   traZODone (DESYREL) 150 MG tablet Take 1-2 tablets (150-300 mg total) by mouth at bedtime. 90 tablet 3   pantoprazole (PROTONIX) 40 MG tablet Take 1 tablet (40 mg total) by mouth 2 (two) times daily. 60 tablet 3   No current facility-administered medications for this visit.    Allergies as of 12/02/2022 - Review Complete 12/02/2022  Allergen Reaction Noted   Dulcolax [bisacodyl] Nausea And Vomiting 08/27/2022   Flagyl [metronidazole] Itching 01/15/2015   Latex Other (See Comments)  02/08/2013   Lithium Itching 08/01/2022    Family History  Problem Relation Age of Onset   Diabetes Mother    Heart attack Mother    COPD Mother    Anxiety disorder Mother    Arthritis Mother    Cancer Mother    Depression Mother    Hearing loss Mother    Kidney disease Mother    Miscarriages / [place] Mother    Alcohol abuse Father    ADD / ADHD Brother    Depression Brother    Drug abuse Brother    Cirrhosis Maternal Grandfather        ETOH   Colon cancer Maternal Grandfather    Learning disabilities Son     Social History   Socioeconomic History   Marital status: Legally Separated    Spouse name: Not on file   Number of children: Not on file   Years of education: Not on file   Highest education level: GED or equivalent  Occupational History   Not on file  Tobacco Use   Smoking status: Every Day    Packs/day: 1.50    Years: 30.00    Additional pack years: 0.00    Total pack years: 45.00    Types: Cigarettes   Smokeless tobacco: Never  Substance and Sexual Activity   Alcohol use: Not Currently    Comment: Never a heavy drinker   Drug use: Never    Types: Marijuana    Comment: occassional use -last used "months ago".   Sexual activity: Yes    Birth control/protection: Surgical  Other Topics Concern   Not on file  Social History Narrative   Not on file   Social Determinants of Health   Financial Resource Strain: Low Risk  (09/15/2022)   Overall Financial Resource Strain (CARDIA)    Difficulty of Paying Living Expenses: Not hard at all  Food Insecurity: Food Insecurity Present (09/15/2022)   Hunger Vital Sign    Worried About Running Out of Food in the Last Year: Sometimes true    Ran Out of Food in the Last Year: Sometimes true  Transportation Needs: No Transportation Needs (09/15/2022)   PRAPARE - Administrator, Civil Service (Medical): No    Lack of Transportation (Non-Medical): No  Physical Activity: Insufficiently Active  (09/15/2022)   Exercise Vital Sign    Days of Exercise per Week: 2 days    Minutes of Exercise per Session: 20 min  Stress: Stress Concern Present (09/15/2022)   [employer] of Occupational Health - Occupational Stress Questionnaire    Feeling of Stress : To some extent  Social Connections: Moderately Integrated (09/15/2022)   Social Connection and Isolation Panel [NHANES]    Frequency of Communication with Friends and  Family: More than three times a week    Frequency of Social Gatherings with Friends and Family: More than three times a week    Attends Religious Services: More than 4 times per year    Active Member of [employer] or Organizations: Yes    Attends Engineer, structural: More than 4 times per year    Marital Status: Separated    Review of Systems: Gen: Denies fever, chills, anorexia. Denies fatigue, weakness, weight loss.  CV: Denies chest pain, palpitations, syncope, peripheral edema, and claudication. Resp: Denies dyspnea at rest, cough, wheezing, coughing up blood, and pleurisy. GI: Denies vomiting blood, jaundice, and fecal incontinence.   Denies dysphagia or odynophagia. Derm: Denies rash, itching, dry skin Psych: Denies depression, anxiety, memory loss, confusion. No homicidal or suicidal ideation.  Heme: Denies bruising, bleeding, and enlarged lymph nodes.  Physical Exam: BP 119/78 (BP Location: Left Arm, Patient Position: Sitting, Cuff Size: Normal)   Pulse 77   Temp 97.9 F (36.6 C) (Temporal)   Ht 5\' 5"  (1.651 m)   Wt 188 lb (85.3 kg)   LMP 08/19/2014   SpO2 100%   BMI 31.28 kg/m  General:   Alert and oriented. No distress noted. Pleasant and cooperative.  Head:  Normocephalic and atraumatic. Eyes:  Conjuctiva clear without scleral icterus. Heart:  S1, S2 present without murmurs appreciated. Lungs:  Clear to auscultation bilaterally. No wheezes, rales, or rhonchi. No distress.  Abdomen:  +BS, soft, non-tender and non-distended. No rebound or  guarding. No HSM or masses noted. Msk:  Symmetrical without gross deformities. Normal posture. Extremities:  Without edema. Neurologic:  Alert and  oriented x4 Psych:  Normal mood and affect.    Assessment:  52 year old female with history of anxiety, arthritis, cervical cancer, COPD, depression, fibromyalgia, IBS, GERD, presenting today for follow-up of upper abdominal pain, nausea, and elevated alk phos. Also discussed constipation and bloating.   Upper abdominal pain/nausea/PUD:  Suspect ongoing postprandial upper abdominal pain and nausea likely secondary to peptic ulcer disease noted on recent EGD completed 08/29/2022.  She has completed 12 weeks of pantoprazole 40 mg twice daily, currently taking once daily, but she has continued to take ibuprofen 800 mg at least 3 to 4 days a week.  Recommend increasing pantoprazole back to twice daily for now, and we will plan for repeat EGD in the near future to document ulcer healing.  If peptic ulcer disease has resolved and symptoms continue, recommend gastric emptying study for possible gastroparesis.   Bloating:  Postprandial bloating.  Symptoms could be secondary to peptic ulcer disease, constipation, dietary choices/intolerances, and unable to rule out gastroparesis.  Plan to repeat EGD to verify healing of peptic ulcer disease, Linzess for constipation, and also discussed avoiding common gas producing items.  If persistent symptoms, consider gastric emptying study and screening for celiac disease.  Notably, duodenum appeared normal on recent EGD.  Constipation:  Chronic. Likely IBS-C. Will trial Linzess 145 mcg daily.   Elevated alk phos:  Chronic.  Most recently 222 on 11/07/22. Etiology is not clear to me.  She has had extensive evaluation thus far including liver biopsy November 2022 revealing mild to moderate portal chronic inflammation with interface hepatitis (grade 2 of 4), no significant lobular inflammation, borderline mild portal fibrosis  without cirrhosis (stage I of 4), negative for steatosis. Comment states differential includes nonhepatic tropic viral infection, drug or toxin induced liver injury, exposure to chemicals or solvents. MRI abdomen with and without contrast 08/17/2021: Mildly enlarged  liver, portions of the liver capsule slightly nodular, unable to rule out early cirrhosis.  Recent labs for viral hepatitis, autoimmune hepatitis, PBC, all negative.  Alkaline phosphatase isoenzymes completed in March revealing liver fraction 70%, bone fraction 30%.  Abdominal ultrasound with elastography 08/26/2022 with increased hepatic echogenicity, no definite nodularity, normal spleen, median kPa 4.3. No ETOH. No tylenol to this point. No herbal supplements. The only medications she is on that may cause hepatotoxicity include Cymbalta and Crestor, but with AST and ALT within normal limits, I suspect this is less likely.  She was recently found to have PUD which can cause alk phos elevations.   At this point, I will plan to discuss with Dr. Marletta Lor.   Plan:  Proceed with upper endoscopy with propofol by Dr. Marletta Lor in near future. The risks, benefits, and alternatives have been discussed with the patient in detail. The patient states understanding and desires to proceed.  ASA 3 Increase PPI to BID No NSAIDs May use Tylenol, but no more than 2000 mcg per day.  Low fat diet.  Avoid spicy foods.  Avoid common gas producing items including broccoli, cabbage, Brussels sprouts, beans, artificial sweeteners, drinking through a straw, carbonated beverages, hard candy, chewing gum. Start Lizness 145 mcg daily. Samples of Linzess 72 mcg provided and advised to take 2 capsule each morning.   Discuss elevated alk phos with Dr. Marletta Lor.   Follow-up after EGD.    Ermalinda Memos, PA-C [facility] 12/02/2022

## 2022-11-30 NOTE — Progress Notes (Unsigned)
Referring Provider: Wylene Men* Primary Care Physician:  Rica Records, FNP Primary GI Physician: Dr. Marletta Lor  No chief complaint on file.   HPI:   Jennifer Rollins is a 52 y.o. female presenting today with a history of   Last seen in our office at the time of initial consult 08/08/2022 for elevated alkaline phosphatase, upper abdominal pain/early satiety, weight loss.  She reported 2 years of elevated alkaline phosphatase with extensive evaluation with [facility] in [facility] with no definitive diagnosis and not currently on any medications.  Her alk phos was elevated at 272 on recent labs in March.  Plan to request records with additional recommendations to follow.  She also reported 30 pound unintentional weight loss, postprandial epigastric and left upper abdominal pain, early satiety over the last year.  Stated upper GI symptoms started after having significant illness 1 year ago.  She also reported history of colon polyps.  Recommended EGD, colonoscopy, request records.   Records from [facility] gastroenterology. No labs included regarding workup for elevated alkaline phosphatase.   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  Comment states differential includes nonhepatic tropic viral infection, drug or toxin induced liver injury, exposure to chemicals or solvents.   MRI abdomen with and without contrast 08/17/2021: Mildly enlarged liver, portions of the liver capsule slightly nodular, cannot exclude early changes of cirrhosis.  Normal spleen size.  No ascites.  No adenopathy.  No suspicious enhancing liver mass.  Variant cecal anatomy (located in the midline)   EGD 04/10/2021: Irregularity in the Z-line and GE junction biopsied, congestion and erythema  in the antrum biopsied, normal examined duodenum biopsied.  Z-line/GE junction pathology with mild chronic inflammation, gastric biopsy with minimal chronic gastritis, negative for H. pylori, duodenal biopsy with intact villous architecture.   Colonoscopy 04/10/2021: Normal colonic and ileal mucosa s/p multiple biopsies including biopsy of TI, grade 1 internal hemorrhoids.  TI biopsy with focal active colitis with granuloma formation, favor involvement by Crohn's, biopsy of the ascending colon, transverse colon, descending colon, sigmoid colon, normal.    Recommended updating labs and ultrasound.  Completed 08/12/2022: Hepatitis A, B, and C labs were negative.  Recommended vaccination for hepatitis A and B. ANA, AMA, ASMA, IgG, IgM all within normal limits.  Mild elevation of IgA at 555. Alk phos 244 with liver fraction 70%, bone fraction 30%.    Abdominal ultrasound with elastography is 08/26/2022: Increased hepatic echogenicity, no definite nodularity, normal spleen, median K PA 4.3.  Procedures completed 08/29/2022: Colonoscopy: Normal exam.  Repeat in 10 years. EGD: Severe reflux esophagitis biopsied, gastritis biopsied, nonbleeding gastric ulcer with no stigmata of bleeding, normal examined duodenum.  Gastric biopsies benign, no H. pylori.  Esophageal biopsies benign.  She was started on pantoprazole 40 mg twice daily.   Today:   Peptic ulcer disease: PPI: NSAIDs:  Elevated alkaline phosphatase/fatty liver:  Weight loss:   Past Medical History:  Diagnosis Date   Abnormal Pap smear of cervix    Allergy    Anemia    Anxiety    Arthritis    Benign essential hypertension 06/14/2020   Cancer (HCC) 2016   Cervical   Cataract 2022   Chronic back pain    Chronic pelvic pain in female    COPD (chronic obstructive pulmonary disease) (HCC)  Depression    Fibromyalgia    GERD (gastroesophageal reflux disease) 1990   HPV in female    Hypothyroidism 02/25/2020   IBS (irritable  bowel syndrome)    NASH (nonalcoholic steatohepatitis)    PTSD (post-traumatic stress disorder)    Sciatica of right side    Sleep apnea 2022    Past Surgical History:  Procedure Laterality Date   ABDOMINAL HYSTERECTOMY N/A 09/14/2014   Procedure: HYSTERECTOMY ABDOMINAL;  Surgeon: Lazaro Arms, MD;  Location: [facility];  Service: Gynecology;  Laterality: N/A;   BIOPSY  08/29/2022   Procedure: BIOPSY;  Surgeon: Lanelle Bal, DO;  Location: [facility];  Service: Endoscopy;;   CESAREAN SECTION     CHOLECYSTECTOMY     COLONOSCOPY WITH PROPOFOL N/A 08/29/2022   Procedure: COLONOSCOPY WITH PROPOFOL;  Surgeon: Lanelle Bal, DO;  Location: [facility];  Service: Endoscopy;  Laterality: N/A;  11:30 am   COLPOSCOPY VULVA W/ BIOPSY     ESOPHAGOGASTRODUODENOSCOPY (EGD) WITH PROPOFOL N/A 08/29/2022   Procedure: ESOPHAGOGASTRODUODENOSCOPY (EGD) WITH PROPOFOL;  Surgeon: Lanelle Bal, DO;  Location: [facility];  Service: Endoscopy;  Laterality: N/A;   EXCISIONAL HEMORRHOIDECTOMY     FRACTURE SURGERY  2022   Right forearm   right forearm surgery Right    SALPINGOOPHORECTOMY Bilateral 09/14/2014   Procedure: SALPINGO OOPHORECTOMY;  Surgeon: Lazaro Arms, MD;  Location: [facility];  Service: Gynecology;  Laterality: Bilateral;   TUBAL LIGATION      Current Outpatient Medications  Medication Sig Dispense Refill   albuterol (VENTOLIN HFA) 108 (90 Base) MCG/ACT inhaler Inhale 2 puffs into the lungs every 6 (six) hours as needed for wheezing or shortness of breath. 8 g 3   Budeson-Glycopyrrol-Formoterol (BREZTRI AEROSPHERE) 160-9-4.8 MCG/ACT AERO Inhale 2 puffs into the lungs 2 (two) times daily. (Patient taking differently: Inhale 2 puffs into the lungs 2 (two) times daily as needed (shortness breath or wheezing).) 10.7 g 11   Cholecalciferol (VITAMIN D3) 20 MCG (800 UNIT) TABS Take 1 tablet by mouth daily. 75 tablet 1   Docusate Sodium (DSS) 100 MG CAPS Take 100 mg by mouth daily as needed  (constipation).     DULoxetine (CYMBALTA) 30 MG capsule Take 2 capsules (60 mg total) by mouth daily. 180 capsule 3   ibuprofen (ADVIL) 800 MG tablet Take 1 tablet (800 mg total) by mouth every 8 (eight) hours as needed. 90 tablet 1   levocetirizine (XYZAL) 5 MG tablet Take 1 tablet (5 mg total) by mouth every evening. 30 tablet 3   pantoprazole (PROTONIX) 40 MG tablet Take 1 tablet (40 mg total) by mouth 2 (two) times daily. 60 tablet 3   rosuvastatin (CRESTOR) 5 MG tablet Take 1 tablet (5 mg total) by mouth daily. 90 tablet 3   traZODone (DESYREL) 150 MG tablet Take 1-2 tablets (150-300 mg total) by mouth at bedtime. 90 tablet 3   No current facility-administered medications for this visit.    Allergies as of 12/02/2022 - Review Complete 11/25/2022  Allergen Reaction Noted   Dulcolax [bisacodyl] Nausea And Vomiting 08/27/2022   Flagyl [metronidazole] Itching 01/15/2015   Latex Other (See Comments) 02/08/2013   Lithium Itching 08/01/2022    Family History  Problem Relation Age of Onset   Diabetes Mother    Heart attack Mother    COPD Mother    Anxiety disorder Mother    Arthritis Mother    Cancer Mother    Depression Mother  Hearing loss Mother    Kidney disease Mother    Miscarriages / [place] Mother    Alcohol abuse Father    ADD / ADHD Brother    Depression Brother    Drug abuse Brother    Cirrhosis Maternal Grandfather        ETOH   Colon cancer Maternal Grandfather    Learning disabilities Son     Social History   Socioeconomic History   Marital status: Legally Separated    Spouse name: Not on file   Number of children: Not on file   Years of education: Not on file   Highest education level: GED or equivalent  Occupational History   Not on file  Tobacco Use   Smoking status: Every Day    Packs/day: 1.50    Years: 30.00    Additional pack years: 0.00    Total pack years: 45.00    Types: Cigarettes   Smokeless tobacco: Never  Substance and Sexual  Activity   Alcohol use: Not Currently    Comment: Never a heavy drinker   Drug use: Never    Types: Marijuana    Comment: occassional use -last used "months ago".   Sexual activity: Yes    Birth control/protection: Surgical  Other Topics Concern   Not on file  Social History Narrative   Not on file   Social Determinants of Health   Financial Resource Strain: Low Risk  (09/15/2022)   Overall Financial Resource Strain (CARDIA)    Difficulty of Paying Living Expenses: Not hard at all  Food Insecurity: Food Insecurity Present (09/15/2022)   Hunger Vital Sign    Worried About Running Out of Food in the Last Year: Sometimes true    Ran Out of Food in the Last Year: Sometimes true  Transportation Needs: No Transportation Needs (09/15/2022)   PRAPARE - Administrator, Civil Service (Medical): No    Lack of Transportation (Non-Medical): No  Physical Activity: Insufficiently Active (09/15/2022)   Exercise Vital Sign    Days of Exercise per Week: 2 days    Minutes of Exercise per Session: 20 min  Stress: Stress Concern Present (09/15/2022)   [employer] of Occupational Health - Occupational Stress Questionnaire    Feeling of Stress : To some extent  Social Connections: Moderately Integrated (09/15/2022)   Social Connection and Isolation Panel [NHANES]    Frequency of Communication with Friends and Family: More than three times a week    Frequency of Social Gatherings with Friends and Family: More than three times a week    Attends Religious Services: More than 4 times per year    Active Member of [employer] or Organizations: Yes    Attends Engineer, structural: More than 4 times per year    Marital Status: Separated    Review of Systems: Gen: Denies fever, chills, anorexia. Denies fatigue, weakness, weight loss.  CV: Denies chest pain, palpitations, syncope, peripheral edema, and claudication. Resp: Denies dyspnea at rest, cough, wheezing, coughing up blood, and  pleurisy. GI: Denies vomiting blood, jaundice, and fecal incontinence.   Denies dysphagia or odynophagia. Derm: Denies rash, itching, dry skin Psych: Denies depression, anxiety, memory loss, confusion. No homicidal or suicidal ideation.  Heme: Denies bruising, bleeding, and enlarged lymph nodes.  Physical Exam: LMP 08/19/2014  General:   Alert and oriented. No distress noted. Pleasant and cooperative.  Head:  Normocephalic and atraumatic. Eyes:  Conjuctiva clear without scleral icterus. Heart:  S1, S2 present  without murmurs appreciated. Lungs:  Clear to auscultation bilaterally. No wheezes, rales, or rhonchi. No distress.  Abdomen:  +BS, soft, non-tender and non-distended. No rebound or guarding. No HSM or masses noted. Msk:  Symmetrical without gross deformities. Normal posture. Extremities:  Without edema. Neurologic:  Alert and  oriented x4 Psych:  Normal mood and affect.    Assessment:     Plan:  ***   Ermalinda Memos, PA-C [facility] 12/02/2022

## 2022-12-02 ENCOUNTER — Encounter: Payer: Self-pay | Admitting: Gastroenterology

## 2022-12-02 ENCOUNTER — Ambulatory Visit (INDEPENDENT_AMBULATORY_CARE_PROVIDER_SITE_OTHER): Payer: 59 | Admitting: Gastroenterology

## 2022-12-02 VITALS — BP 119/78 | HR 77 | Temp 97.9°F | Ht 65.0 in | Wt 188.0 lb

## 2022-12-02 DIAGNOSIS — K5900 Constipation, unspecified: Secondary | ICD-10-CM | POA: Diagnosis not present

## 2022-12-02 DIAGNOSIS — K279 Peptic ulcer, site unspecified, unspecified as acute or chronic, without hemorrhage or perforation: Secondary | ICD-10-CM

## 2022-12-02 DIAGNOSIS — R1010 Upper abdominal pain, unspecified: Secondary | ICD-10-CM | POA: Diagnosis not present

## 2022-12-02 DIAGNOSIS — R748 Abnormal levels of other serum enzymes: Secondary | ICD-10-CM | POA: Diagnosis not present

## 2022-12-02 DIAGNOSIS — R110 Nausea: Secondary | ICD-10-CM

## 2022-12-02 DIAGNOSIS — R6881 Early satiety: Secondary | ICD-10-CM | POA: Diagnosis not present

## 2022-12-02 MED ORDER — PANTOPRAZOLE SODIUM 40 MG PO TBEC
40.0000 mg | DELAYED_RELEASE_TABLET | Freq: Two times a day (BID) | ORAL | 3 refills | Status: DC
Start: 2022-12-02 — End: 2023-05-23

## 2022-12-02 NOTE — Patient Instructions (Addendum)
We will arrange you to have an upper endoscopy with Dr. Marletta Lor at [facility] to see if the ulcers in your stomach have healed.  Increase pantoprazole back to 40 mg twice daily 30 minutes before breakfast and dinner for now.  Avoid all NSAID products including ibuprofen, Aleve, Advil, BC powders, Goody powders, and anything that says "NSAID" on the package.  If you need to take something for pain, I recommend that you use Tylenol.  No more than 2000 mg of Tylenol per 24 hours.  Avoid fried, fatty, greasy, spicy foods.  Avoid common gas producing items including broccoli, cabbage, Brussels sprouts, beans, artificial sweeteners, drinking through a straw, carbonated beverages, hard candy, chewing gum.  For constipation, start Linzess 145 mcg daily.  We are providing you with samples today.  Please call with a progress report next week.  If this works well for you, I will send a prescription to your pharmacy.  If this does not work well, we will make adjustments.  I will discuss your elevated alkaline phosphatase with Dr. Marletta Lor and let you know what he says.  It was good to see you again today!  I will plan to see back in the office after your endoscopy.  Ermalinda Memos, PA-C [facility]

## 2022-12-04 ENCOUNTER — Ambulatory Visit (INDEPENDENT_AMBULATORY_CARE_PROVIDER_SITE_OTHER): Payer: 59

## 2022-12-04 VITALS — Ht 65.0 in | Wt 187.0 lb

## 2022-12-04 DIAGNOSIS — Z0000 Encounter for general adult medical examination without abnormal findings: Secondary | ICD-10-CM

## 2022-12-04 DIAGNOSIS — Z0100 Encounter for examination of eyes and vision without abnormal findings: Secondary | ICD-10-CM

## 2022-12-04 NOTE — Progress Notes (Signed)
 Subjective:   Jennifer Rollins is a 52 y.o. female who presents for an Initial Medicare Annual Wellness Visit.  Visit Complete: Virtual  I connected with  Nikoleta Dady on 12/04/22 by a audio enabled telemedicine application and verified that I am speaking with the correct person using two identifiers.  Patient Location: Home  Provider Location: Home Office  I discussed the limitations of evaluation and management by telemedicine. The patient expressed understanding and agreed to proceed.  Patient Medicare AWV questionnaire was completed by the patient on 12/02/08; I have confirmed that all information answered by patient is correct and no changes since this date.  Review of Systems     Cardiac Risk Factors include: obesity (BMI >30kg/m2);sedentary lifestyle;dyslipidemia;smoking/ tobacco exposure     Objective:    Today's Vitals   12/04/22 1026  Weight: 187 lb (84.8 kg)  Height: 5\' 5"  (1.651 m)   Body mass index is 31.12 kg/m.     12/04/2022   10:26 AM 08/27/2022    2:39 PM 09/30/2015    2:01 PM 09/14/2015    7:52 AM 08/14/2015    8:32 PM 04/11/2015    2:22 PM 01/15/2015    7:46 AM  Advanced Directives  Does Patient Have a Medical Advance Directive? No No No No No No No  Would patient like information on creating a medical advance directive? No - Patient declined No - Patient declined No - patient declined information No - patient declined information  No - patient declined information No - patient declined information    Current Medications (verified) Outpatient Encounter Medications as of 12/04/2022  Medication Sig   albuterol (VENTOLIN HFA) 108 (90 Base) MCG/ACT inhaler Inhale 2 puffs into the lungs every 6 (six) hours as needed for wheezing or shortness of breath.   Budeson-Glycopyrrol-Formoterol (BREZTRI AEROSPHERE) 160-9-4.8 MCG/ACT AERO Inhale 2 puffs into the lungs 2 (two) times daily. (Patient taking differently: Inhale 2 puffs into the lungs 2 (two) times daily as  needed (shortness breath or wheezing).)   Cholecalciferol (VITAMIN D3) 20 MCG (800 UNIT) TABS Take 1 tablet by mouth daily.   Docusate Sodium (DSS) 100 MG CAPS Take 100 mg by mouth daily as needed (constipation).   DULoxetine (CYMBALTA) 30 MG capsule Take 2 capsules (60 mg total) by mouth daily.   ibuprofen (ADVIL) 800 MG tablet Take 1 tablet (800 mg total) by mouth every 8 (eight) hours as needed.   levocetirizine (XYZAL) 5 MG tablet Take 1 tablet (5 mg total) by mouth every evening.   Omega-3 Fatty Acids (FISH OIL) 300 MG CAPS Take by mouth.   pantoprazole (PROTONIX) 40 MG tablet Take 1 tablet (40 mg total) by mouth 2 (two) times daily.   rosuvastatin (CRESTOR) 5 MG tablet Take 1 tablet (5 mg total) by mouth daily.   traZODone (DESYREL) 150 MG tablet Take 1-2 tablets (150-300 mg total) by mouth at bedtime.   [DISCONTINUED] dicyclomine (BENTYL) 20 MG tablet Take one every 6 hours for abd cramps (Patient not taking: Reported on 09/30/2015)   No facility-administered encounter medications on file as of 12/04/2022.    Allergies (verified) Dulcolax [bisacodyl], Flagyl [metronidazole], Latex, and Lithium   History: Past Medical History:  Diagnosis Date   Abnormal Pap smear of cervix    Allergy    Anemia    Anxiety    Arthritis    Benign essential hypertension 06/14/2020   Cancer (HCC) 2016   Cervical   Cataract 2022   Chronic back pain  Chronic pelvic pain in female    COPD (chronic obstructive pulmonary disease) (HCC)    Depression    Fibromyalgia    GERD (gastroesophageal reflux disease) 1990   HPV in female    Hypothyroidism 02/25/2020   IBS (irritable bowel syndrome)    NASH (nonalcoholic steatohepatitis)    PTSD (post-traumatic stress disorder)    Sciatica of right side    Sleep apnea 2022   Past Surgical History:  Procedure Laterality Date   ABDOMINAL HYSTERECTOMY N/A 09/14/2014   Procedure: HYSTERECTOMY ABDOMINAL;  Surgeon: Lazaro Arms, MD;  Location: [facility];   Service: Gynecology;  Laterality: N/A;   BIOPSY  08/29/2022   Procedure: BIOPSY;  Surgeon: Lanelle Bal, DO;  Location: [facility];  Service: Endoscopy;;   CESAREAN SECTION     CHOLECYSTECTOMY     COLONOSCOPY WITH PROPOFOL N/A 08/29/2022   Procedure: COLONOSCOPY WITH PROPOFOL;  Surgeon: Lanelle Bal, DO;  Location: [facility];  Service: Endoscopy;  Laterality: N/A;  11:30 am   COLPOSCOPY VULVA W/ BIOPSY     ESOPHAGOGASTRODUODENOSCOPY (EGD) WITH PROPOFOL N/A 08/29/2022   Procedure: ESOPHAGOGASTRODUODENOSCOPY (EGD) WITH PROPOFOL;  Surgeon: Lanelle Bal, DO;  Location: [facility];  Service: Endoscopy;  Laterality: N/A;   EXCISIONAL HEMORRHOIDECTOMY     FRACTURE SURGERY  2022   Right forearm   right forearm surgery Right    SALPINGOOPHORECTOMY Bilateral 09/14/2014   Procedure: SALPINGO OOPHORECTOMY;  Surgeon: Lazaro Arms, MD;  Location: [facility];  Service: Gynecology;  Laterality: Bilateral;   TUBAL LIGATION     Family History  Problem Relation Age of Onset   Diabetes Mother    Heart attack Mother    COPD Mother    Anxiety disorder Mother    Arthritis Mother    Cancer Mother    Depression Mother    Hearing loss Mother    Kidney disease Mother    Miscarriages / [place] Mother    Alcohol abuse Father    ADD / ADHD Brother    Depression Brother    Drug abuse Brother    Cirrhosis Maternal Grandfather        ETOH   Colon cancer Maternal Grandfather    Learning disabilities Son    Social History   Socioeconomic History   Marital status: Legally Separated    Spouse name: Not on file   Number of children: Not on file   Years of education: Not on file   Highest education level: GED or equivalent  Occupational History   Not on file  Tobacco Use   Smoking status: Every Day    Packs/day: 1.50    Years: 30.00    Additional pack years: 0.00    Total pack years: 45.00    Types: Cigarettes   Smokeless tobacco: Never  Substance and Sexual Activity   Alcohol  use: Not Currently    Comment: Never a heavy drinker   Drug use: Never    Types: Marijuana    Comment: occassional use -last used "months ago".   Sexual activity: Yes    Birth control/protection: Surgical  Other Topics Concern   Not on file  Social History Narrative   Not on file   Social Determinants of Health   Financial Resource Strain: Medium Risk (12/04/2022)   Overall Financial Resource Strain (CARDIA)    Difficulty of Paying Living Expenses: Somewhat hard  Food Insecurity: Food Insecurity Present (12/04/2022)   Hunger Vital Sign    Worried  About Running Out of Food in the Last Year: Sometimes true    Ran Out of Food in the Last Year: Sometimes true  Transportation Needs: No Transportation Needs (12/04/2022)   PRAPARE - Administrator, Civil Service (Medical): No    Lack of Transportation (Non-Medical): No  Physical Activity: Inactive (12/04/2022)   Exercise Vital Sign    Days of Exercise per Week: 2 days    Minutes of Exercise per Session: 0 min  Stress: Stress Concern Present (12/04/2022)   [employer] of Occupational Health - Occupational Stress Questionnaire    Feeling of Stress : To some extent  Social Connections: Socially Isolated (12/04/2022)   Social Connection and Isolation Panel [NHANES]    Frequency of Communication with Friends and Family: Twice a week    Frequency of Social Gatherings with Friends and Family: Once a week    Attends Religious Services: Never    Database administrator or Organizations: No    Attends Engineer, structural: Never    Marital Status: Separated    Tobacco Counseling Ready to quit: Yes Counseling given: Yes   Clinical Intake:  Pre-visit preparation completed: Yes  Pain : No/denies pain     BMI - recorded: 31.12 Nutritional Status: BMI > 30  Obese Nutritional Risks: None Diabetes: No  How often do you need to have someone help you when you read instructions, pamphlets, or other written  materials from your doctor or pharmacy?: 1 - Never  Interpreter Needed?: No  Information entered by ::  Rochelle Larue, CMA   Activities of Daily Living    12/04/2022   10:31 AM 12/03/2022    8:06 PM  In your present state of health, do you have any difficulty performing the following activities:  Hearing? 0 0  Vision? 1 1  Difficulty concentrating or making decisions? 1 1  Walking or climbing stairs? 1 1  Comment chronic knee pain   Dressing or bathing? 0 0  Doing errands, shopping? 0 0  Preparing Food and eating ? N N  Using the Toilet?  N  In the past six months, have you accidently leaked urine? N N  Do you have problems with loss of bowel control? N N  Managing your Medications? N N  Managing your Finances? N N  Housekeeping or managing your Housekeeping? N N    Patient Care Team: Del Newman Nip, Tenna Child, FNP as PCP - General (Family Medicine) Jena Gauss, Gerrit Friends, MD as Consulting Physician (Gastroenterology)  Indicate any recent Medical Services you may have received from other than [facility] providers in the past year (date may be approximate).     Assessment:   This is a routine wellness examination for Rebie.  Hearing/Vision screen Hearing Screening - Comments:: Patient denies any hearing difficulties.    Dietary issues and exercise activities discussed:     Goals Addressed             This Visit's Progress    Patient Stated       Work on getting strength back and increase energy        Depression Screen    12/04/2022   10:30 AM 11/07/2022    8:49 AM 08/06/2022    8:10 AM  PHQ 2/9 Scores  PHQ - 2 Score 1 2 5   PHQ- 9 Score  14 17  Exception Documentation Patient refusal      Fall Risk    12/04/2022   10:29 AM 12/03/2022  8:06 PM 11/07/2022    8:49 AM 08/06/2022    8:10 AM  Fall Risk   Falls in the past year? 1 1 1  0  Number falls in past yr: 0 0 0 0  Injury with Fall? 0 0 1 0  Risk for fall due to : No Fall Risks     Follow up Falls prevention  discussed       MEDICARE RISK AT HOME:  Medicare Risk at Home - 12/04/22 1028     Any stairs in or around the home? Yes    If so, are there any without handrails? No    Home free of loose throw rugs in walkways, pet beds, electrical cords, etc? Yes    Adequate lighting in your home to reduce risk of falls? Yes    Life alert? No    Use of a cane, walker or w/c? No    Grab bars in the bathroom? No    Shower chair or bench in shower? No    Elevated toilet seat or a handicapped toilet? No             TIMED UP AND GO:  Was the test performed? No    Cognitive Function:        12/04/2022   10:28 AM  6CIT Screen  What Year? 0 points  What month? 0 points  What time? 0 points  Count back from 20 0 points  Months in reverse 0 points  Repeat phrase 0 points  Total Score 0 points    Immunizations  There is no immunization history on file for this patient.  TDAP status: Due, Education has been provided regarding the importance of this vaccine. Advised may receive this vaccine at local pharmacy or Health Dept. Aware to provide a copy of the vaccination record if obtained from local pharmacy or Health Dept. Verbalized acceptance and understanding.  Flu Vaccine status: Up to date  Pneumococcal vaccine status: Declined,  Education has been provided regarding the importance of this vaccine but patient still declined. Advised may receive this vaccine at local pharmacy or Health Dept. Aware to provide a copy of the vaccination record if obtained from local pharmacy or Health Dept. Verbalized acceptance and understanding.   Covid-19 vaccine status: Declined, Education has been provided regarding the importance of this vaccine but patient still declined. Advised may receive this vaccine at local pharmacy or Health Dept.or vaccine clinic. Aware to provide a copy of the vaccination record if obtained from local pharmacy or Health Dept. Verbalized acceptance and understanding.  Qualifies  for Shingles Vaccine? Yes   Zostavax completed No   Shingrix Completed?: No.    Education has been provided regarding the importance of this vaccine. Patient has been advised to call insurance company to determine out of pocket expense if they have not yet received this vaccine. Advised may also receive vaccine at local pharmacy or Health Dept. Verbalized acceptance and understanding.  Screening Tests Health Maintenance  Topic Date Due   Medicare Annual Wellness (AWV)  Never done   DTaP/Tdap/Td (1 - Tdap) Never done   Zoster Vaccines- Shingrix (1 of 2) Never done   Lung Cancer Screening  01/13/2019   MAMMOGRAM  01/13/2019   COVID-19 Vaccine (2 - Moderna risk series) 11/29/2019   INFLUENZA VACCINE  12/26/2022   Colonoscopy  08/28/2032   Hepatitis C Screening  Completed   HIV Screening  Completed   HPV VACCINES  Aged Out   PAP SMEAR-Modifier  Discontinued    Health Maintenance  Health Maintenance Due  Topic Date Due   Medicare Annual Wellness (AWV)  Never done   DTaP/Tdap/Td (1 - Tdap) Never done   Zoster Vaccines- Shingrix (1 of 2) Never done   Lung Cancer Screening  01/13/2019   MAMMOGRAM  01/13/2019   COVID-19 Vaccine (2 - Moderna risk series) 11/29/2019    Colorectal cancer screening: Type of screening: Colonoscopy. Completed 08/29/2022. Repeat every 10 years  Mammogram: Patient provided with number to [facility] to reschedule her appt  Bone Density Screening: NOT AGE APPROPRIATE FOR THIS PATIENT  Lung Cancer Screening: (Low Dose CT Chest recommended if Age 65-80 years, 20 pack-year currently smoking OR have quit w/in 15years.) does qualify.   Lung Cancer Screening Referral: patient provided with number to schedule  Additional Screening:  Hepatitis C Screening: does not qualify; Completed 08/12/2022  Vision Screening: Recommended annual ophthalmology exams for early detection of glaucoma and other disorders of the eye. Is the patient up to date with their annual eye  exam?  No  Who is the provider or what is the name of the office in which the patient attends annual eye exams? Referral placed today If pt is not established with a provider, would they like to be referred to a provider to establish care? Yes .   Dental Screening: Recommended annual dental exams for proper oral hygiene  Diabetic Foot Exam: n/a  Community Resource Referral / Chronic Care Management: CRR required this visit?  No   CCM required this visit?  No     Plan:     I have personally reviewed and noted the following in the patient's chart:   Medical and social history Use of alcohol, tobacco or illicit drugs  Current medications and supplements including opioid prescriptions. Patient is not currently taking opioid prescriptions. Functional ability and status Nutritional status Physical activity Advanced directives List of other physicians Hospitalizations, surgeries, and ER visits in previous 12 months Vitals Screenings to include cognitive, depression, and falls Referrals and appointments  In addition, I have reviewed and discussed with patient certain preventive protocols, quality metrics, and best practice recommendations. A written personalized care plan for preventive services as well as general preventive health recommendations were provided to patient.   Any medications not marked as taking were not mentioned by the patient (or their caregiver if applicable) when reconciling the medications.  Because this visit was a virtual/telehealth visit,  certain criteria was not obtained, such a blood pressure, CBG if patient is a diabetic, and timed up and go.    Jordan Hawks Chaselynn Kepple, CMA   12/04/2022   After Visit Summary: (MyChart) Due to this being a telephonic visit, the after visit summary with patients personalized plan was offered to patient via MyChart   Nurse Notes:

## 2022-12-04 NOTE — Patient Instructions (Signed)
Jennifer Rollins , Thank you for taking time to come for your Medicare Wellness Visit. I appreciate your ongoing commitment to your health goals. Please review the following plan we discussed and let me know if I can assist you in the future.   These are the goals we discussed:  Goals      Patient Stated     Work on getting strength back and increase energy         This is a list of the screening recommended for you and due dates:  Health Maintenance  Topic Date Due   DTaP/Tdap/Td vaccine (1 - Tdap) Never done   Zoster (Shingles) Vaccine (1 of 2) Never done   Screening for Lung Cancer  01/13/2019   Mammogram  01/13/2019   COVID-19 Vaccine (2 - Moderna risk series) 11/29/2019   Flu Shot  12/26/2022   Medicare Annual Wellness Visit  12/04/2023   Colon Cancer Screening  08/28/2032   Hepatitis C Screening  Completed   HIV Screening  Completed   HPV Vaccine  Aged Out   Pap Smear  Discontinued    Advanced directives:  Advance directive discussed with you today. Even though you declined this today, please call our office should you change your mind, and we can give you the proper paperwork for you to fill out. Advance care planning is a way to make decisions about medical care that fits your values in case you are ever unable to make these decisions for yourself.  Information on Advanced Care Planning can be found at [facility] of [facility] Advance Health Care Directives Advance Health Care Directives ([email or web address]    Conditions/risks identified:  You have an order for:  []   2D Mammogram  [x]   3D Mammogram  []   Bone Density   [x]   Lung Cancer Screening  Please call for appointment:   [facility] [address] [place], [place] [address] [phone]  Make sure to wear two-piece clothing.  No lotions powders or deodorants the day of the appointment Make sure to bring picture ID and insurance card.  Bring list of medications you are  currently taking including any supplements.   Schedule your [place] screening mammogram through MyChart!   Log into your MyChart account.  Go to 'Visit' (or 'Appointments' if on mobile App) --> Schedule an Appointment  Under 'Select a Reason for Visit' choose the Mammogram Screening option.  Complete the pre-visit questions and select the time and place that best fits your schedule.    Next appointment: VIRTUAL/TELEPHONE APPOINTMENT Follow up in one year for your annual wellness visit.  January 14, 2024 at 10:00 am telephone visit  Preventive Care 40-64 Years, Female Preventive care refers to lifestyle choices and visits with your health care provider that can promote health and wellness. What does preventive care include? A yearly physical exam. This is also called an annual well check. Dental exams once or twice a year. Routine eye exams. Ask your health care provider how often you should have your eyes checked. Personal lifestyle choices, including: Daily care of your teeth and gums. Regular physical activity. Eating a healthy diet. Avoiding tobacco and drug use. Limiting alcohol use. Practicing safe sex. Taking low-dose aspirin daily starting at age 47. Taking vitamin and mineral supplements as recommended by your health care provider. What happens during an annual well check? The services and screenings done by your health care provider during your annual well check will depend  on your age, overall health, lifestyle risk factors, and family history of disease. Counseling  Your health care provider may ask you questions about your: Alcohol use. Tobacco use. Drug use. Emotional well-being. Home and relationship well-being. Sexual activity. Eating habits. Work and work Astronomer. Method of birth control. Menstrual cycle. Pregnancy history. Screening  You may have the following tests or measurements: Height, weight, and BMI. Blood pressure. Lipid and  cholesterol levels. These may be checked every 5 years, or more frequently if you are over 83 years old. Skin check. Lung cancer screening. You may have this screening every year starting at age 8 if you have a 30-pack-year history of smoking and currently smoke or have quit within the past 15 years. Fecal occult blood test (FOBT) of the stool. You may have this test every year starting at age 28. Flexible sigmoidoscopy or colonoscopy. You may have a sigmoidoscopy every 5 years or a colonoscopy every 10 years starting at age 1. Hepatitis C blood test. Hepatitis B blood test. Sexually transmitted disease (STD) testing. Diabetes screening. This is done by checking your blood sugar (glucose) after you have not eaten for a while (fasting). You may have this done every 1-3 years. Mammogram. This may be done every 1-2 years. Talk to your health care provider about when you should start having regular mammograms. This may depend on whether you have a family history of breast cancer. BRCA-related cancer screening. This may be done if you have a family history of breast, ovarian, tubal, or peritoneal cancers. Pelvic exam and Pap test. This may be done every 3 years starting at age 4. Starting at age 57, this may be done every 5 years if you have a Pap test in combination with an HPV test. Bone density scan. This is done to screen for osteoporosis. You may have this scan if you are at high risk for osteoporosis. Discuss your test results, treatment options, and if necessary, the need for more tests with your health care provider. Vaccines  Your health care provider may recommend certain vaccines, such as: Influenza vaccine. This is recommended every year. Tetanus, diphtheria, and acellular pertussis (Tdap, Td) vaccine. You may need a Td booster every 10 years. Zoster vaccine. You may need this after age 25. Pneumococcal 13-valent conjugate (PCV13) vaccine. You may need this if you have certain conditions  and were not previously vaccinated. Pneumococcal polysaccharide (PPSV23) vaccine. You may need one or two doses if you smoke cigarettes or if you have certain conditions. Talk to your health care provider about which screenings and vaccines you need and how often you need them. This information is not intended to replace advice given to you by your health care provider. Make sure you discuss any questions you have with your health care provider. Document Released: 06/09/2015 Document Revised: 01/31/2016 Document Reviewed: 03/14/2015 Elsevier Interactive Patient Education  2017 [employer].    Fall Prevention in the Home Falls can cause injuries. They can happen to people of all ages. There are many things you can do to make your home safe and to help prevent falls. What can I do on the outside of my home? Regularly fix the edges of walkways and driveways and fix any cracks. Remove anything that might make you trip as you walk through a door, such as a raised step or threshold. Trim any bushes or trees on the path to your home. Use bright outdoor lighting. Clear any walking paths of anything that might make someone trip,  such as rocks or tools. Regularly check to see if handrails are loose or broken. Make sure that both sides of any steps have handrails. Any raised decks and porches should have guardrails on the edges. Have any leaves, snow, or ice cleared regularly. Use sand or salt on walking paths during winter. Clean up any spills in your garage right away. This includes oil or grease spills. What can I do in the bathroom? Use night lights. Install grab bars by the toilet and in the tub and shower. Do not use towel bars as grab bars. Use non-skid mats or decals in the tub or shower. If you need to sit down in the shower, use a plastic, non-slip stool. Keep the floor dry. Clean up any water that spills on the floor as soon as it happens. Remove soap buildup in the tub or shower  regularly. Attach bath mats securely with double-sided non-slip rug tape. Do not have throw rugs and other things on the floor that can make you trip. What can I do in the bedroom? Use night lights. Make sure that you have a light by your bed that is easy to reach. Do not use any sheets or blankets that are too big for your bed. They should not hang down onto the floor. Have a firm chair that has side arms. You can use this for support while you get dressed. Do not have throw rugs and other things on the floor that can make you trip. What can I do in the kitchen? Clean up any spills right away. Avoid walking on wet floors. Keep items that you use a lot in easy-to-reach places. If you need to reach something above you, use a strong step stool that has a grab bar. Keep electrical cords out of the way. Do not use floor polish or wax that makes floors slippery. If you must use wax, use non-skid floor wax. Do not have throw rugs and other things on the floor that can make you trip. What can I do with my stairs? Do not leave any items on the stairs. Make sure that there are handrails on both sides of the stairs and use them. Fix handrails that are broken or loose. Make sure that handrails are as long as the stairways. Check any carpeting to make sure that it is firmly attached to the stairs. Fix any carpet that is loose or worn. Avoid having throw rugs at the top or bottom of the stairs. If you do have throw rugs, attach them to the floor with carpet tape. Make sure that you have a light switch at the top of the stairs and the bottom of the stairs. If you do not have them, ask someone to add them for you. What else can I do to help prevent falls? Wear shoes that: Do not have high heels. Have rubber bottoms. Are comfortable and fit you well. Are closed at the toe. Do not wear sandals. If you use a stepladder: Make sure that it is fully opened. Do not climb a closed stepladder. Make sure that  both sides of the stepladder are locked into place. Ask someone to hold it for you, if possible. Clearly mark and make sure that you can see: Any grab bars or handrails. First and last steps. Where the edge of each step is. Use tools that help you move around (mobility aids) if they are needed. These include: Canes. Walkers. Scooters. Crutches. Turn on the lights when you go into  a dark area. Replace any light bulbs as soon as they burn out. Set up your furniture so you have a clear path. Avoid moving your furniture around. If any of your floors are uneven, fix them. If there are any pets around you, be aware of where they are. Review your medicines with your doctor. Some medicines can make you feel dizzy. This can increase your chance of falling. Ask your doctor what other things that you can do to help prevent falls. This information is not intended to replace advice given to you by your health care provider. Make sure you discuss any questions you have with your health care provider. Document Released: 03/09/2009 Document Revised: 10/19/2015 Document Reviewed: 06/17/2014 Elsevier Interactive Patient Education  2017 [employer].

## 2022-12-12 ENCOUNTER — Encounter: Payer: Self-pay | Admitting: Orthopedic Surgery

## 2022-12-12 ENCOUNTER — Telehealth: Payer: Self-pay | Admitting: *Deleted

## 2022-12-12 ENCOUNTER — Ambulatory Visit (INDEPENDENT_AMBULATORY_CARE_PROVIDER_SITE_OTHER): Payer: 59 | Admitting: Orthopedic Surgery

## 2022-12-12 ENCOUNTER — Other Ambulatory Visit: Payer: Self-pay | Admitting: Gastroenterology

## 2022-12-12 ENCOUNTER — Encounter: Payer: Self-pay | Admitting: *Deleted

## 2022-12-12 DIAGNOSIS — M25562 Pain in left knee: Secondary | ICD-10-CM

## 2022-12-12 DIAGNOSIS — K5909 Other constipation: Secondary | ICD-10-CM

## 2022-12-12 DIAGNOSIS — I8393 Asymptomatic varicose veins of bilateral lower extremities: Secondary | ICD-10-CM | POA: Diagnosis not present

## 2022-12-12 MED ORDER — LINACLOTIDE 145 MCG PO CAPS
145.0000 ug | ORAL_CAPSULE | Freq: Every day | ORAL | 5 refills | Status: DC
Start: 2022-12-12 — End: 2023-06-26

## 2022-12-12 NOTE — Patient Instructions (Signed)
You will get a call about the vein appointment Here in our office if you do not hear anything in a couple weeks call us to let us know and we will check on it for you

## 2022-12-12 NOTE — Telephone Encounter (Signed)
Rx sent 

## 2022-12-12 NOTE — Telephone Encounter (Signed)
Noted. Informed pt.  

## 2022-12-12 NOTE — Telephone Encounter (Signed)
Pt came by office and states Linzess are working well for her. She wanted samples and states she needs a prescriptions sent to her pharmacy.

## 2022-12-12 NOTE — Progress Notes (Signed)
MRI RESULTS FOLLOW UP   Encounter Diagnoses  Name Primary?   Acute pain of left knee Yes   Closed fracture of distal end of right radius, unspecified fracture morphology, sequela     Chief Complaint  Patient presents with   Knee Pain    L/ Here to go over the MRI. My knee hurts and kept me last night, tried using a pillow under knee didn't help.     52 year old history of fall last year fracturing her proximal tibia: [place]at about 6 wks ago when someone grabbed her hand trying to run from a mouse.  When they let go of her hand she fell.   She complains of medial knee pain.       + EXAM FINDINGS: Left Knee Exam    Muscle Strength  The patient has normal left knee strength.   Tenderness  Left knee tenderness location: prox. med. tibia.   Range of Motion  Extension:  normal  Flexion:  normal    Tests  McMurray:  Medial - negative  Drawer:  Anterior - negative     Posterior - negative   Other  Erythema: absent Sensation: normal Pulse: present Swelling: none Effusion: no effusion present         Imaging: 3 v left knee - no acute frx, mild OA   MY READING OF THE MRI the patient has no intra-articular pathology seen on MRI scan  MRI REPORT:   IMPRESSION: 1. No internal derangement of the left knee. 2.  No acute osseous injury of the left knee.     Electronically Signed   By: Elige Ko M.D.   On: 12/03/2022 07:17    ASSESSMENT AND PLAN :   52 year old female had MRI of her knee after acute onset of pain after fall.  MRI came back normal  Patient says her symptoms are worse at night she has pain on the medial side of the knee.  We saw mild OA on the x-ray grade 2 but normal MRI.  Really do not have a lot of options for her.  I suggested an injection she agreed.  If it helps we can do that every 3 months otherwise she will use Tylenol and ibuprofen and a knee sleeve    Procedure note left knee injection   verbal consent was obtained to inject left knee  joint  Timeout was completed to confirm the site of injection  The medications used were depomedrol 40 mg and 1% lidocaine 3 cc Anesthesia was provided by ethyl chloride and the skin was prepped with alcohol.  After cleaning the skin with alcohol a 20-gauge needle was used to inject the left knee joint. There were no complications. A sterile bandage was applied.

## 2022-12-16 ENCOUNTER — Encounter: Payer: Self-pay | Admitting: *Deleted

## 2022-12-17 ENCOUNTER — Telehealth: Payer: Self-pay | Admitting: *Deleted

## 2022-12-17 NOTE — Telephone Encounter (Signed)
[employer] PA: Notification or Prior Authorization is not required for the requested services You are not required to submit a notification/prior authorization based on the information provided. If you have general questions about the prior authorization requirements, visit [email or web address] > Clinician Resources > Advance and Admission Notification Requirements. The number above acknowledges your notification. Please write this reference number down for future reference. If you would like to request an organization determination, please call us at [phone]. Decision ID #: [id number]

## 2022-12-22 DIAGNOSIS — J449 Chronic obstructive pulmonary disease, unspecified: Secondary | ICD-10-CM | POA: Diagnosis not present

## 2022-12-26 ENCOUNTER — Encounter (HOSPITAL_COMMUNITY)
Admission: RE | Admit: 2022-12-26 | Discharge: 2022-12-26 | Disposition: A | Discharge status: Home / Self Care | Payer: 59 | Source: Ambulatory Visit | Attending: Internal Medicine | Admitting: Internal Medicine

## 2022-12-30 ENCOUNTER — Encounter (HOSPITAL_COMMUNITY)
Admission: RE | Disposition: A | Discharge status: Home / Self Care | Payer: Self-pay | Source: Home / Self Care | Attending: Internal Medicine

## 2022-12-30 ENCOUNTER — Ambulatory Visit (HOSPITAL_COMMUNITY): Payer: 59 | Admitting: Anesthesiology

## 2022-12-30 ENCOUNTER — Encounter (HOSPITAL_COMMUNITY): Payer: Self-pay

## 2022-12-30 ENCOUNTER — Ambulatory Visit (HOSPITAL_COMMUNITY)
Admission: RE | Admit: 2022-12-30 | Discharge: 2022-12-30 | Disposition: A | Discharge status: Home / Self Care | Payer: 59 | Attending: Internal Medicine | Admitting: Internal Medicine

## 2022-12-30 ENCOUNTER — Ambulatory Visit (HOSPITAL_BASED_OUTPATIENT_CLINIC_OR_DEPARTMENT_OTHER): Payer: 59 | Admitting: Anesthesiology

## 2022-12-30 DIAGNOSIS — F17210 Nicotine dependence, cigarettes, uncomplicated: Secondary | ICD-10-CM | POA: Insufficient documentation

## 2022-12-30 DIAGNOSIS — Z79899 Other long term (current) drug therapy: Secondary | ICD-10-CM | POA: Diagnosis not present

## 2022-12-30 DIAGNOSIS — K2950 Unspecified chronic gastritis without bleeding: Secondary | ICD-10-CM | POA: Insufficient documentation

## 2022-12-30 DIAGNOSIS — F32A Depression, unspecified: Secondary | ICD-10-CM | POA: Diagnosis not present

## 2022-12-30 DIAGNOSIS — Z791 Long term (current) use of non-steroidal anti-inflammatories (NSAID): Secondary | ICD-10-CM | POA: Insufficient documentation

## 2022-12-30 DIAGNOSIS — R748 Abnormal levels of other serum enzymes: Secondary | ICD-10-CM | POA: Diagnosis not present

## 2022-12-30 DIAGNOSIS — R1010 Upper abdominal pain, unspecified: Secondary | ICD-10-CM

## 2022-12-30 DIAGNOSIS — E039 Hypothyroidism, unspecified: Secondary | ICD-10-CM | POA: Insufficient documentation

## 2022-12-30 DIAGNOSIS — J449 Chronic obstructive pulmonary disease, unspecified: Secondary | ICD-10-CM | POA: Insufficient documentation

## 2022-12-30 DIAGNOSIS — I10 Essential (primary) hypertension: Secondary | ICD-10-CM | POA: Insufficient documentation

## 2022-12-30 DIAGNOSIS — K3189 Other diseases of stomach and duodenum: Secondary | ICD-10-CM | POA: Diagnosis not present

## 2022-12-30 DIAGNOSIS — K219 Gastro-esophageal reflux disease without esophagitis: Secondary | ICD-10-CM | POA: Insufficient documentation

## 2022-12-30 DIAGNOSIS — K319 Disease of stomach and duodenum, unspecified: Secondary | ICD-10-CM | POA: Insufficient documentation

## 2022-12-30 DIAGNOSIS — R112 Nausea with vomiting, unspecified: Secondary | ICD-10-CM | POA: Diagnosis not present

## 2022-12-30 DIAGNOSIS — M797 Fibromyalgia: Secondary | ICD-10-CM | POA: Diagnosis not present

## 2022-12-30 DIAGNOSIS — M1990 Unspecified osteoarthritis, unspecified site: Secondary | ICD-10-CM | POA: Diagnosis not present

## 2022-12-30 DIAGNOSIS — K2970 Gastritis, unspecified, without bleeding: Secondary | ICD-10-CM | POA: Diagnosis not present

## 2022-12-30 DIAGNOSIS — Z86010 Personal history of colonic polyps: Secondary | ICD-10-CM | POA: Diagnosis not present

## 2022-12-30 DIAGNOSIS — K279 Peptic ulcer, site unspecified, unspecified as acute or chronic, without hemorrhage or perforation: Secondary | ICD-10-CM

## 2022-12-30 DIAGNOSIS — R110 Nausea: Secondary | ICD-10-CM

## 2022-12-30 DIAGNOSIS — F419 Anxiety disorder, unspecified: Secondary | ICD-10-CM | POA: Insufficient documentation

## 2022-12-30 HISTORY — PX: BIOPSY: SHX5522

## 2022-12-30 HISTORY — PX: ESOPHAGOGASTRODUODENOSCOPY (EGD) WITH PROPOFOL: SHX5813

## 2022-12-30 SURGERY — ESOPHAGOGASTRODUODENOSCOPY (EGD) WITH PROPOFOL
Anesthesia: General

## 2022-12-30 MED ORDER — LIDOCAINE HCL (CARDIAC) PF 100 MG/5ML IV SOSY
PREFILLED_SYRINGE | INTRAVENOUS | Status: DC | PRN
  Administered 2022-12-30: 50 mg via INTRAVENOUS

## 2022-12-30 MED ORDER — LACTATED RINGERS IV SOLN: INTRAVENOUS | Status: DC

## 2022-12-30 MED ORDER — PROPOFOL 10 MG/ML IV BOLUS
INTRAVENOUS | Status: DC | PRN
Start: 2022-12-30 — End: 2022-12-30
  Administered 2022-12-30: 100 mg via INTRAVENOUS
  Administered 2022-12-30: 50 mg via INTRAVENOUS

## 2022-12-30 NOTE — Op Note (Addendum)
[facility] Patient Name: Jennifer Rollins Procedure Date: 12/30/2022 9:13 AM MRN: [id number] Date of Birth: Oct 01, 1970 Attending MD: Hennie Duos. Marletta Lor , [place], [id number] CSN: [id number] Age: 52 Admit Type: Outpatient Procedure:                Upper GI endoscopy Indications:              Epigastric abdominal pain, Nausea with vomiting Providers:                Hennie Duos. Marletta Lor, DO, Crystal Page, Pandora Leiter                            Tech., Technician Referring MD:              Medicines:                See the Anesthesia note for documentation of the                            administered medications Complications:            No immediate complications. Estimated Blood Loss:     Estimated blood loss was minimal. Procedure:                Pre-Anesthesia Assessment:                           - The anesthesia plan was to use monitored                            anesthesia care (MAC).                           After obtaining informed consent, the endoscope was                            passed under direct vision. Throughout the                            procedure, the patient's blood pressure, pulse, and                            oxygen saturations were monitored continuously. The                            [id number]-H190 ([id number]) scope was introduced through the                            mouth, and advanced to the second part of duodenum.                            The upper GI endoscopy was accomplished without                            difficulty. The patient tolerated the procedure                            well.  Scope In: 9:25:32 AM Scope Out: 9:29:12 AM Total Procedure Duration: 0 hours 3 minutes 40 seconds  Findings:      The examined esophagus was normal.      Patchy mild inflammation characterized by erythema was found in the       gastric body and in the gastric antrum. Biopsies were taken with a cold       forceps for Helicobacter pylori testing. Previously noted  ulcer has       healed.      The duodenal bulb, first portion of the duodenum and second portion of       the duodenum were normal. Impression:               - Normal esophagus.                           - Gastritis. Biopsied.                           - Normal duodenal bulb, first portion of the                            duodenum and second portion of the duodenum. Moderate Sedation:      Per Anesthesia Care Recommendation:           - Patient has a contact number available for                            emergencies. The signs and symptoms of potential                            delayed complications were discussed with the                            patient. Return to normal activities tomorrow.                            Written discharge instructions were provided to the                            patient.                           - Resume previous diet.                           - Continue present medications.                           - Await pathology results.                           - Use Protonix (pantoprazole) 40 mg PO daily.                           - Return to GI clinic in 3 months. Procedure Code(s):        --- Professional ---  53664, Esophagogastroduodenoscopy, flexible,                            transoral; with biopsy, single or multiple Diagnosis Code(s):        --- Professional ---                           K29.70, Gastritis, unspecified, without bleeding                           R10.13, Epigastric pain                           R11.2, Nausea with vomiting, unspecified CPT copyright 2022 American Medical Association. All rights reserved. The codes documented in this report are preliminary and upon coder review may  be revised to meet current compliance requirements. Hennie Duos. Marletta Lor, DO Hennie Duos. Marletta Lor, DO 12/30/2022 9:59:41 AM This report has been signed electronically. Number of Addenda: 0

## 2022-12-30 NOTE — Anesthesia Preprocedure Evaluation (Signed)
Anesthesia Evaluation  Patient identified by MRN, date of birth, ID band Patient awake    Reviewed: Allergy & Precautions, H&P , NPO status , Patient's Chart, lab work & pertinent test results  Airway Mallampati: II  TM Distance: >3 FB Neck ROM: Full    Dental  (+) Edentulous Upper, Edentulous Lower   Pulmonary sleep apnea , COPD, Current Smoker and Patient abstained from smoking.   Pulmonary exam normal breath sounds clear to auscultation       Cardiovascular hypertension, Pt. on medications Normal cardiovascular exam Rhythm:Regular Rate:Normal  09-Aug-2022 16:46:27 [facility]-ED ROUTINE RECORD [birth year] (51 yr) Female Caucasian Vent. rate 71 BPM PR interval 178 ms QRS duration 88 ms QT/QTcB 416/452 ms P-R-T axes 56 82 54 Normal sinus rhythm Low voltage QRS Nonspecific ST abnormality Abnormal ECG When compared with ECG of 14-Sep-2015 09:22, PREVIOUS ECG IS PRESENT Confirmed by Eber Hong ([id number]) on 08/09/2022 5:04:12 PM   Neuro/Psych  PSYCHIATRIC DISORDERS Anxiety Depression     Neuromuscular disease    GI/Hepatic ,GERD  Medicated and Controlled,,(+) Hepatitis -  Endo/Other  Hypothyroidism (denied h/o hypothyroidism)    Renal/GU negative Renal ROS  negative genitourinary   Musculoskeletal  (+) Arthritis , Osteoarthritis,  Fibromyalgia -  Abdominal   Peds negative pediatric ROS (+)  Hematology  (+) Blood dyscrasia, anemia   Anesthesia Other Findings   Reproductive/Obstetrics negative OB ROS                             Anesthesia Physical Anesthesia Plan  ASA: 2  Anesthesia Plan: General   Post-op Pain Management: Minimal or no pain anticipated   Induction: Intravenous  PONV Risk Score and Plan: Treatment may vary due to age or medical condition  Airway Management Planned: Nasal Cannula and Natural Airway  Additional Equipment:   Intra-op Plan:    Post-operative Plan:   Informed Consent: I have reviewed the patients History and Physical, chart, labs and discussed the procedure including the risks, benefits and alternatives for the proposed anesthesia with the patient or authorized representative who has indicated his/her understanding and acceptance.       Plan Discussed with: CRNA and Surgeon  Anesthesia Plan Comments:         Anesthesia Quick Evaluation

## 2022-12-30 NOTE — Transfer of Care (Signed)
Immediate Anesthesia Transfer of Care Note  Patient: [name]  Procedure(s) Performed: ESOPHAGOGASTRODUODENOSCOPY (EGD) WITH PROPOFOL BIOPSY  Patient Location: Short Stay  Anesthesia Type:General  Level of Consciousness: drowsy  Airway & Oxygen Therapy: Patient Spontanous Breathing  Post-op Assessment: Report given to RN and Post -op Vital signs reviewed and stable  Post vital signs: Reviewed and stable  Last Vitals:  Vitals Value Taken Time  BP    Temp    Pulse    Resp    SpO2      Last Pain:  Vitals:   12/30/22 0921  PainSc: 0-No pain         Complications: No notable events documented.

## 2022-12-30 NOTE — Anesthesia Procedure Notes (Signed)
Date/Time: 12/30/2022 9:26 AM  Performed by: Julian Reil, CRNAPre-anesthesia Checklist: Patient identified, Emergency Drugs available, Suction available and Patient being monitored Patient Re-evaluated:Patient Re-evaluated prior to induction Oxygen Delivery Method: Nasal cannula Induction Type: IV induction Placement Confirmation: positive ETCO2

## 2022-12-30 NOTE — Interval H&P Note (Signed)
History and Physical Interval Note:  12/30/2022 9:14 AM  Jennifer Rollins  has presented today for surgery, with the diagnosis of upper abdominal pain, nausea,PUD.  The various methods of treatment have been discussed with the patient and family. After consideration of risks, benefits and other options for treatment, the patient has consented to  Procedure(s) with comments: ESOPHAGOGASTRODUODENOSCOPY (EGD) WITH PROPOFOL (N/A) - 9:45 am, asa 3 as a surgical intervention.  The patient's history has been reviewed, patient examined, no change in status, stable for surgery.  I have reviewed the patient's chart and labs.  Questions were answered to the patient's satisfaction.     Lanelle Bal

## 2022-12-30 NOTE — Discharge Instructions (Addendum)
EGD Discharge instructions Please read the instructions outlined below and refer to this sheet in the next few weeks. These discharge instructions provide you with general information on caring for yourself after you leave the hospital. Your doctor may also give you specific instructions. While your treatment has been planned according to the most current medical practices available, unavoidable complications occasionally occur. If you have any problems or questions after discharge, please call your doctor. ACTIVITY You may resume your regular activity but move at a slower pace for the next 24 hours.  Take frequent rest periods for the next 24 hours.  Walking will help expel (get rid of) the air and reduce the bloated feeling in your abdomen.  No driving for 24 hours (because of the anesthesia (medicine) used during the test).  You may shower.  Do not sign any important legal documents or operate any machinery for 24 hours (because of the anesthesia used during the test).  NUTRITION Drink plenty of fluids.  You may resume your normal diet.  Begin with a light meal and progress to your normal diet.  Avoid alcoholic beverages for 24 hours or as instructed by your caregiver.  MEDICATIONS You may resume your normal medications unless your caregiver tells you otherwise.  WHAT YOU CAN EXPECT TODAY You may experience abdominal discomfort such as a feeling of fullness or "gas" pains.  FOLLOW-UP Your doctor will discuss the results of your test with you.  SEEK IMMEDIATE MEDICAL ATTENTION IF ANY OF THE FOLLOWING OCCUR: Excessive nausea (feeling sick to your stomach) and/or vomiting.  Severe abdominal pain and distention (swelling).  Trouble swallowing.  Temperature over 101 F (37.8 C).  Rectal bleeding or vomiting of blood.   Previously noted esophagitis is healed.  Previously noted ulcer has healed.  Mild inflammation of stomach which I rebiopsied today.  Small bowel appeared normal.  You can  decrease your pantoprazole to once daily as tolerated.  Follow-up in GI office in 2 to 3 months.   I hope you have a great rest of your week!  Hennie Duos. Marletta Lor, D.O. Gastroenterology and Hepatology [facility]

## 2022-12-30 NOTE — Anesthesia Postprocedure Evaluation (Signed)
Anesthesia Post Note  Patient: Jennifer Rollins  Procedure(s) Performed: ESOPHAGOGASTRODUODENOSCOPY (EGD) WITH PROPOFOL BIOPSY  Patient location during evaluation: Phase II Anesthesia Type: General Level of consciousness: awake and alert and oriented Pain management: pain level controlled Vital Signs Assessment: post-procedure vital signs reviewed and stable Respiratory status: spontaneous breathing, nonlabored ventilation and respiratory function stable Cardiovascular status: blood pressure returned to baseline and stable Postop Assessment: no apparent nausea or vomiting Anesthetic complications: no  No notable events documented.   Last Vitals:  Vitals:   12/30/22 0934 12/30/22 0940  BP: (!) 93/56 105/69  Pulse: 63   Resp: (!) 21   Temp: 36.9 C   SpO2: 96%     Last Pain:  Vitals:   12/30/22 0934  TempSrc: Oral  PainSc: 0-No pain                  C 

## 2022-12-31 ENCOUNTER — Telehealth: Payer: Self-pay | Admitting: Gastroenterology

## 2022-12-31 DIAGNOSIS — R748 Abnormal levels of other serum enzymes: Secondary | ICD-10-CM

## 2022-12-31 NOTE — Telephone Encounter (Signed)
Please let patient know I reviewed her case with Dr. Marletta Lor. The cause of her chronically elevated alkaline phosphatase is not clear. Dr. Marletta Lor recommended we have her see the liver clinic in [facility] with [facility] for further evaluation.   Mindy:  Please place referral to Annamarie Major, NP with [facility] in [place] once patient is aware and agreeable to referral.

## 2023-01-02 ENCOUNTER — Encounter (HOSPITAL_COMMUNITY): Payer: Self-pay | Admitting: Internal Medicine

## 2023-01-02 NOTE — Telephone Encounter (Signed)
Called pt and she is aware of recs. She was agreeable. Referral placed

## 2023-01-02 NOTE — Telephone Encounter (Signed)
Noted  

## 2023-01-02 NOTE — Addendum Note (Signed)
Addended by: Armstead Peaks on: 01/02/2023 09:22 AM   Modules accepted: Orders

## 2023-01-09 ENCOUNTER — Other Ambulatory Visit: Payer: Self-pay | Admitting: Emergency Medicine

## 2023-01-09 DIAGNOSIS — Z87891 Personal history of nicotine dependence: Secondary | ICD-10-CM

## 2023-01-09 DIAGNOSIS — F17210 Nicotine dependence, cigarettes, uncomplicated: Secondary | ICD-10-CM

## 2023-01-09 DIAGNOSIS — Z122 Encounter for screening for malignant neoplasm of respiratory organs: Secondary | ICD-10-CM

## 2023-01-21 ENCOUNTER — Encounter: Payer: Self-pay | Admitting: Obstetrics & Gynecology

## 2023-01-21 ENCOUNTER — Ambulatory Visit (INDEPENDENT_AMBULATORY_CARE_PROVIDER_SITE_OTHER): Payer: 59 | Admitting: Obstetrics & Gynecology

## 2023-01-21 VITALS — BP 108/71 | HR 66 | Ht 65.0 in | Wt 189.0 lb

## 2023-01-21 DIAGNOSIS — N952 Postmenopausal atrophic vaginitis: Secondary | ICD-10-CM

## 2023-01-21 DIAGNOSIS — N941 Unspecified dyspareunia: Secondary | ICD-10-CM

## 2023-01-21 MED ORDER — ESTROGENS CONJUGATED 0.625 MG/GM VA CREA: 1.0000 | TOPICAL_CREAM | Freq: Every day | VAGINAL | 11 refills | Status: DC

## 2023-01-21 NOTE — Progress Notes (Signed)
Chief Complaint  Patient presents with   Gynecologic Exam      52 y.o. [id number] Patient's last menstrual period was 08/19/2014. The current method of family planning is status post hysterectomy.  Outpatient Encounter Medications as of 01/21/2023  Medication Sig   albuterol (VENTOLIN HFA) 108 (90 Base) MCG/ACT inhaler Inhale 2 puffs into the lungs every 6 (six) hours as needed for wheezing or shortness of breath.   Budeson-Glycopyrrol-Formoterol (BREZTRI AEROSPHERE) 160-9-4.8 MCG/ACT AERO Inhale 2 puffs into the lungs 2 (two) times daily. (Patient taking differently: Inhale 2 puffs into the lungs 2 (two) times daily as needed (shortness breath or wheezing).)   Cholecalciferol (VITAMIN D3) 20 MCG (800 UNIT) TABS Take 1 tablet by mouth daily.   conjugated estrogens (PREMARIN) vaginal cream Place 1 Applicatorful vaginally daily.   Docusate Sodium (DSS) 100 MG CAPS Take 100 mg by mouth daily as needed (constipation).   DULoxetine (CYMBALTA) 30 MG capsule Take 2 capsules (60 mg total) by mouth daily.   folic acid (FOLVITE) 1 MG tablet Take 1 mg by mouth daily.   levocetirizine (XYZAL) 5 MG tablet Take 1 tablet (5 mg total) by mouth every evening.   linaclotide (LINZESS) 145 MCG CAPS capsule Take 1 capsule (145 mcg total) by mouth daily before breakfast.   Omega-3 Fatty Acids (FISH OIL) 300 MG CAPS Take by mouth.   pantoprazole (PROTONIX) 40 MG tablet Take 1 tablet (40 mg total) by mouth 2 (two) times daily.   rosuvastatin (CRESTOR) 5 MG tablet Take 1 tablet (5 mg total) by mouth daily.   traZODone (DESYREL) 150 MG tablet Take 1-2 tablets (150-300 mg total) by mouth at bedtime.   [DISCONTINUED] dicyclomine (BENTYL) 20 MG tablet Take one every 6 hours for abd cramps (Patient not taking: Reported on 09/30/2015)   No facility-administered encounter medications on file as of 01/21/2023.    Subjective Pt has been having discomfort with intercourse over the past 2 years or so 6 months ago  started with some bleeding with intercourse She also has a bump she thinks is related She had TAH BSO 08/2014 for HSIL + menometrorrhgia and dysmenorrhea  Also has chronic constipation on linzess and bentyl and has bulgoing when she is constipated   Past Medical History:  Diagnosis Date   Abnormal Pap smear of cervix    Allergy    Anemia    Anxiety    Arthritis    Benign essential hypertension 06/14/2020   Cancer (HCC) 2016   Cervical   Cataract 2022   Chronic back pain    Chronic pelvic pain in female    COPD (chronic obstructive pulmonary disease) (HCC)    Depression    Fibromyalgia    GERD (gastroesophageal reflux disease) 1990   HPV in female    Hypothyroidism 02/25/2020   IBS (irritable bowel syndrome)    NASH (nonalcoholic steatohepatitis)    PTSD (post-traumatic stress disorder)    Sciatica of right side    Sleep apnea 2022    Past Surgical History:  Procedure Laterality Date   ABDOMINAL HYSTERECTOMY N/A 09/14/2014   Procedure: HYSTERECTOMY ABDOMINAL;  Surgeon: Lazaro Arms, MD;  Location: [facility];  Service: Gynecology;  Laterality: N/A;   BIOPSY  08/29/2022   Procedure: BIOPSY;  Surgeon: Lanelle Bal, DO;  Location: [facility];  Service: Endoscopy;;   BIOPSY  12/30/2022   Procedure: BIOPSY;  Surgeon: Lanelle Bal, DO;  Location: [facility];  Service: Endoscopy;;  CESAREAN SECTION     CHOLECYSTECTOMY     COLONOSCOPY WITH PROPOFOL N/A 08/29/2022   Procedure: COLONOSCOPY WITH PROPOFOL;  Surgeon: Lanelle Bal, DO;  Location: [facility];  Service: Endoscopy;  Laterality: N/A;  11:30 am   COLPOSCOPY VULVA W/ BIOPSY     ESOPHAGOGASTRODUODENOSCOPY (EGD) WITH PROPOFOL N/A 08/29/2022   Procedure: ESOPHAGOGASTRODUODENOSCOPY (EGD) WITH PROPOFOL;  Surgeon: Lanelle Bal, DO;  Location: [facility];  Service: Endoscopy;  Laterality: N/A;   ESOPHAGOGASTRODUODENOSCOPY (EGD) WITH PROPOFOL N/A 12/30/2022   Procedure: ESOPHAGOGASTRODUODENOSCOPY (EGD) WITH  PROPOFOL;  Surgeon: Lanelle Bal, DO;  Location: [facility];  Service: Endoscopy;  Laterality: N/A;  9:45 am, asa 3   EXCISIONAL HEMORRHOIDECTOMY     FRACTURE SURGERY  2022   Right forearm   right forearm surgery Right    SALPINGOOPHORECTOMY Bilateral 09/14/2014   Procedure: SALPINGO OOPHORECTOMY;  Surgeon: Lazaro Arms, MD;  Location: [facility];  Service: Gynecology;  Laterality: Bilateral;   TUBAL LIGATION      OB History     Gravida  4   Para  2   Term  2   Preterm      AB  2   Living  2      SAB      IAB      Ectopic      Multiple      Live Births              Allergies  Allergen Reactions   Dulcolax [Bisacodyl] Nausea And Vomiting   Flagyl [Metronidazole] Itching   Latex Other (See Comments)   Lithium Itching    Social History   Socioeconomic History   Marital status: Married    Spouse name: Not on file   Number of children: Not on file   Years of education: Not on file   Highest education level: GED or equivalent  Occupational History   Not on file  Tobacco Use   Smoking status: Every Day    Current packs/day: 1.50    Average packs/day: 1.5 packs/day for 30.0 years (45.0 ttl pk-yrs)    Types: Cigarettes   Smokeless tobacco: Never  Substance and Sexual Activity   Alcohol use: Not Currently    Comment: Never a heavy drinker   Drug use: Never    Types: Marijuana    Comment: occassional use -last used "months ago".   Sexual activity: Yes    Birth control/protection: Surgical  Other Topics Concern   Not on file  Social History Narrative   Not on file   Social Determinants of Health   Financial Resource Strain: Low Risk  (01/21/2023)   Overall Financial Resource Strain (CARDIA)    Difficulty of Paying Living Expenses: Not very hard  Recent Concern: Financial Resource Strain - Medium Risk (12/04/2022)   Overall Financial Resource Strain (CARDIA)    Difficulty of Paying Living Expenses: Somewhat hard  Food Insecurity: Food  Insecurity Present (01/21/2023)   Hunger Vital Sign    Worried About Running Out of Food in the Last Year: Sometimes true    Ran Out of Food in the Last Year: Sometimes true  Transportation Needs: No Transportation Needs (01/21/2023)   PRAPARE - Administrator, Civil Service (Medical): No    Lack of Transportation (Non-Medical): No  Physical Activity: Inactive (01/21/2023)   Exercise Vital Sign    Days of Exercise per Week: 0 days    Minutes of Exercise per Session:  0 min  Stress: Stress Concern Present (01/21/2023)   [employer] of Occupational Health - Occupational Stress Questionnaire    Feeling of Stress : To some extent  Social Connections: Socially Isolated (01/21/2023)   Social Connection and Isolation Panel [NHANES]    Frequency of Communication with Friends and Family: More than three times a week    Frequency of Social Gatherings with Friends and Family: Three times a week    Attends Religious Services: Never    Active Member of Clubs or Organizations: No    Attends Banker Meetings: Never    Marital Status: Separated    Family History  Problem Relation Age of Onset   Diabetes Mother    Heart attack Mother    COPD Mother    Anxiety disorder Mother    Arthritis Mother    Cancer Mother    Depression Mother    Hearing loss Mother    Kidney disease Mother    Miscarriages / [place] Mother    Alcohol abuse Father    ADD / ADHD Brother    Depression Brother    Drug abuse Brother    Cirrhosis Maternal Grandfather        ETOH   Colon cancer Maternal Grandfather    Learning disabilities Son     Medications:       Current Outpatient Medications:    albuterol (VENTOLIN HFA) 108 (90 Base) MCG/ACT inhaler, Inhale 2 puffs into the lungs every 6 (six) hours as needed for wheezing or shortness of breath., Disp: 8 g, Rfl: 3   Budeson-Glycopyrrol-Formoterol (BREZTRI AEROSPHERE) 160-9-4.8 MCG/ACT AERO, Inhale 2 puffs into the lungs 2 (two) times  daily. (Patient taking differently: Inhale 2 puffs into the lungs 2 (two) times daily as needed (shortness breath or wheezing).), Disp: 10.7 g, Rfl: 11   Cholecalciferol (VITAMIN D3) 20 MCG (800 UNIT) TABS, Take 1 tablet by mouth daily., Disp: 75 tablet, Rfl: 1   conjugated estrogens (PREMARIN) vaginal cream, Place 1 Applicatorful vaginally daily., Disp: 30 g, Rfl: 11   Docusate Sodium (DSS) 100 MG CAPS, Take 100 mg by mouth daily as needed (constipation)., Disp: , Rfl:    DULoxetine (CYMBALTA) 30 MG capsule, Take 2 capsules (60 mg total) by mouth daily., Disp: 180 capsule, Rfl: 3   folic acid (FOLVITE) 1 MG tablet, Take 1 mg by mouth daily., Disp: , Rfl:    levocetirizine (XYZAL) 5 MG tablet, Take 1 tablet (5 mg total) by mouth every evening., Disp: 30 tablet, Rfl: 3   linaclotide (LINZESS) 145 MCG CAPS capsule, Take 1 capsule (145 mcg total) by mouth daily before breakfast., Disp: 30 capsule, Rfl: 5   Omega-3 Fatty Acids (FISH OIL) 300 MG CAPS, Take by mouth., Disp: , Rfl:    pantoprazole (PROTONIX) 40 MG tablet, Take 1 tablet (40 mg total) by mouth 2 (two) times daily., Disp: 60 tablet, Rfl: 3   rosuvastatin (CRESTOR) 5 MG tablet, Take 1 tablet (5 mg total) by mouth daily., Disp: 90 tablet, Rfl: 3   traZODone (DESYREL) 150 MG tablet, Take 1-2 tablets (150-300 mg total) by mouth at bedtime., Disp: 90 tablet, Rfl: 3  Objective Blood pressure 108/71, pulse 66, height 5\' 5"  (1.651 m), weight 189 lb (85.7 kg), last menstrual period 08/19/2014.  General WDWN female NAD Vulva:  normal appearing vulva with no masses, tenderness or lesions Vagina:  atrophic changes severe The "bump" is her hymenal remnant which is more prominent probably because of vaginal atrophic changes, addition  by subtraction, there is no vulvar or vaginal pathology noted and this bump is non tender with Q tip Qtip and mirror used and pt confirms this to be the area of concern  There is no rectocoele on exam  Cervix:   absent Uterus:  absent Adnexa: absent   Pertinent ROS No burning with urination, frequency or urgency No nausea, vomiting or diarrhea Nor fever chills or other constitutional symptoms    Labs or studies     Impression + Management Plan: Diagnoses this Encounter::   ICD-10-CM   1. Vaginal atrophy, menopausal related  N95.2    vaginal estrogen    2. Dyspareunia, female, insertional  N94.10         Medications prescribed during  this encounter: Meds ordered this encounter  Medications   conjugated estrogens (PREMARIN) vaginal cream    Sig: Place 1 Applicatorful vaginally daily.    Dispense:  30 g    Refill:  11    Labs or Scans Ordered during this encounter: No orders of the defined types were placed in this encounter.     Follow up Return in about 3 months (around 04/23/2023) for Follow up, with Dr Despina Hidden.

## 2023-01-22 DIAGNOSIS — J449 Chronic obstructive pulmonary disease, unspecified: Secondary | ICD-10-CM | POA: Diagnosis not present

## 2023-02-03 ENCOUNTER — Ambulatory Visit (INDEPENDENT_AMBULATORY_CARE_PROVIDER_SITE_OTHER): Payer: 59 | Admitting: Nurse Practitioner

## 2023-02-03 DIAGNOSIS — F17210 Nicotine dependence, cigarettes, uncomplicated: Secondary | ICD-10-CM | POA: Diagnosis not present

## 2023-02-03 NOTE — Progress Notes (Signed)
Virtual Visit via Telephone Note  I connected with Jennifer Rollins on 02/03/23 at  3:30 PM EDT by telephone and verified that I am speaking with the correct person using two identifiers.  Location: Patient: Jennifer Rollins Provider: Posey Boyer   Shared Decision Making Visit Lung Cancer Screening Program [phone])   Eligibility: Age 52 y.o. Pack Years Smoking History Calculation:  50 (# packs/per year x # years smoked) Recent History of coughing up blood:   no Unexplained weight loss? no ( >Than 15 pounds within the last 6 months ) Prior History Lung / other cancer:  yes- ovarian (Diagnosis within the last 5 years already requiring surveillance chest CT Scans). Smoking Status Current Smoker Former Smokers: Years since quit: n/a  Quit Date: n/a  Visit Components: Discussion included one or more decision making aids. yes Discussion included risk/benefits of screening. yes Discussion included potential follow up diagnostic testing for abnormal scans. yes Discussion included meaning and risk of over diagnosis. yes Discussion included meaning and risk of False Positives. yes Discussion included meaning of total radiation exposure. yes  Counseling Included: Importance of adherence to annual lung cancer LDCT screening. yes Impact of comorbidities on ability to participate in the program. yes Ability and willingness to under diagnostic treatment. yes  Smoking Cessation Counseling: Current Smokers:  Discussed importance of smoking cessation. yes Information about tobacco cessation classes and interventions provided to patient. yes Patient provided with "ticket" for LDCT Scan.  N/a Asymptomatic Patient yes  Counseling (Intermediate counseling: > three minutes counseling) [id number] Written Order for Lung Cancer Screening with LDCT placed in Epic.  (CT Chest Lung Cancer Screening Low Dose W/O CM) [id number] Z12.2-Screening of respiratory organs Z87.891-Personal history of nicotine  dependence   Posey Boyer, NP

## 2023-02-03 NOTE — Patient Instructions (Signed)

## 2023-02-04 ENCOUNTER — Ambulatory Visit (HOSPITAL_COMMUNITY)
Admission: RE | Admit: 2023-02-04 | Discharge: 2023-02-04 | Disposition: A | Discharge status: Home / Self Care | Payer: 59 | Source: Ambulatory Visit | Attending: Acute Care | Admitting: Acute Care

## 2023-02-04 DIAGNOSIS — Z87891 Personal history of nicotine dependence: Secondary | ICD-10-CM | POA: Diagnosis not present

## 2023-02-04 DIAGNOSIS — Z122 Encounter for screening for malignant neoplasm of respiratory organs: Secondary | ICD-10-CM | POA: Insufficient documentation

## 2023-02-04 DIAGNOSIS — F17210 Nicotine dependence, cigarettes, uncomplicated: Secondary | ICD-10-CM | POA: Diagnosis not present

## 2023-02-06 ENCOUNTER — Encounter: Payer: Self-pay | Admitting: *Deleted

## 2023-02-10 ENCOUNTER — Encounter (HOSPITAL_COMMUNITY): Payer: Self-pay

## 2023-02-10 ENCOUNTER — Ambulatory Visit (HOSPITAL_COMMUNITY)
Admission: RE | Admit: 2023-02-10 | Discharge: 2023-02-10 | Disposition: A | Discharge status: Home / Self Care | Payer: 59 | Source: Ambulatory Visit | Attending: Family Medicine | Admitting: Family Medicine

## 2023-02-10 DIAGNOSIS — Z1231 Encounter for screening mammogram for malignant neoplasm of breast: Secondary | ICD-10-CM | POA: Diagnosis not present

## 2023-02-10 DIAGNOSIS — Z122 Encounter for screening for malignant neoplasm of respiratory organs: Secondary | ICD-10-CM

## 2023-02-10 NOTE — Telephone Encounter (Signed)
Spoke to pt informed her of recommendations. Pt voiced understanding. Pt states she has stop bleeding. She had internal hemorrhoids and she thinks that is what it is. It was dark red blood. She states she will call if it starts back.

## 2023-02-10 NOTE — Telephone Encounter (Signed)
Toni Amend, please call patient by phone regarding this concern. We have never seen her for rectal bleeding. She is going to need an appointment for further evaluation. If she is having significant rectal bleeding, feels lightheaded, dizzy, has shortness of breath, heart palpitations, or chest pain, she needs to proceed to the emergency room for evaluation rather than being seen in the office.

## 2023-02-11 ENCOUNTER — Other Ambulatory Visit: Payer: Self-pay | Admitting: *Deleted

## 2023-02-11 DIAGNOSIS — I872 Venous insufficiency (chronic) (peripheral): Secondary | ICD-10-CM

## 2023-02-16 ENCOUNTER — Other Ambulatory Visit: Payer: Self-pay

## 2023-02-16 ENCOUNTER — Emergency Department (HOSPITAL_COMMUNITY)
Admission: EM | Admit: 2023-02-16 | Discharge: 2023-02-16 | Disposition: A | Discharge status: Home / Self Care | Payer: 59 | Attending: Emergency Medicine | Admitting: Emergency Medicine

## 2023-02-16 ENCOUNTER — Encounter (HOSPITAL_COMMUNITY): Payer: Self-pay

## 2023-02-16 DIAGNOSIS — Z91040 Latex allergy status: Secondary | ICD-10-CM | POA: Insufficient documentation

## 2023-02-16 DIAGNOSIS — M545 Low back pain, unspecified: Secondary | ICD-10-CM | POA: Insufficient documentation

## 2023-02-16 DIAGNOSIS — M5459 Other low back pain: Secondary | ICD-10-CM | POA: Diagnosis not present

## 2023-02-16 LAB — URINALYSIS, ROUTINE W REFLEX MICROSCOPIC
Bilirubin Urine: NEGATIVE
Glucose, UA: NEGATIVE mg/dL
Hgb urine dipstick: NEGATIVE
Ketones, ur: NEGATIVE mg/dL
Leukocytes,Ua: NEGATIVE
Nitrite: NEGATIVE
Protein, ur: NEGATIVE mg/dL
Specific Gravity, Urine: 1.013 (ref 1.005–1.030)
pH: 5 (ref 5.0–8.0)

## 2023-02-16 MED ORDER — PREDNISONE 50 MG PO TABS
60.0000 mg | ORAL_TABLET | Freq: Once | ORAL | Status: AC
  Administered 2023-02-16: 60 mg via ORAL
  Filled 2023-02-16: qty 1

## 2023-02-16 MED ORDER — PREDNISONE 10 MG (21) PO TBPK: ORAL_TABLET | Freq: Every day | ORAL | 0 refills | Status: DC

## 2023-02-16 MED ORDER — OXYCODONE-ACETAMINOPHEN 5-325 MG PO TABS
1.0000 | ORAL_TABLET | Freq: Once | ORAL | Status: AC
  Administered 2023-02-16: 1 via ORAL
  Filled 2023-02-16: qty 1

## 2023-02-16 MED ORDER — CYCLOBENZAPRINE HCL 10 MG PO TABS: 10.0000 mg | ORAL_TABLET | Freq: Two times a day (BID) | ORAL | 0 refills | Status: DC | PRN

## 2023-02-16 MED ORDER — CYCLOBENZAPRINE HCL 10 MG PO TABS
5.0000 mg | ORAL_TABLET | Freq: Once | ORAL | Status: AC
  Administered 2023-02-16: 5 mg via ORAL
  Filled 2023-02-16: qty 1

## 2023-02-16 NOTE — Discharge Instructions (Addendum)
Please follow-up with your primary care doctor, I believe that you have a muscle strain. Return to the ER if you have any numbness, of your groin, legs, inability to walk, or any kind of loss of urine or bowel

## 2023-02-16 NOTE — ED Provider Notes (Signed)
[place] EMERGENCY DEPARTMENT AT [facility] Provider Note   CSN: [id number] Arrival date & time: 02/16/23  1754     History  Chief Complaint  Patient presents with   Back Pain    Jennifer Rollins is a 52 y.o. female, history of sciatica, hysterectomy, who presents to the ED secondary to back pain after lifting some furniture at her job, about 4 days ago.  States is progressively gotten worse, states she cannot tolerate anymore.  States he almost fell down today, because the pain was so bad.  Denies any radiation down her legs, no fevers, chills, no IV drug use.  Has not had any trauma to the area.  Did not hear a pop at all when she pick up the furniture, just states is not getting better.  Has taken ibuprofen at home without any relief.  No loss of bowel or bladder.  Home Medications Prior to Admission medications   Medication Sig Start Date End Date Taking? Authorizing Provider  cyclobenzaprine (FLEXERIL) 10 MG tablet Take 1 tablet (10 mg total) by mouth 2 (two) times daily as needed for muscle spasms. 02/16/23  Yes Yolonda Purtle L, PA  predniSONE (STERAPRED UNI-PAK 21 TAB) 10 MG (21) TBPK tablet Take by mouth daily. Take 6 tabs by mouth daily  for 1 days, then 5 tabs for 2 days, then 4 tabs for 2 days, then 3 tabs for 2 days, 2 tabs for 2 days, then 1 tab by mouth daily for 2 days 02/16/23  Yes Shanecia Hoganson L, PA  albuterol (VENTOLIN HFA) 108 (90 Base) MCG/ACT inhaler Inhale 2 puffs into the lungs every 6 (six) hours as needed for wheezing or shortness of breath. 11/07/22   Del Nigel Berthold, FNP  Budeson-Glycopyrrol-Formoterol (BREZTRI AEROSPHERE) 160-9-4.8 MCG/ACT AERO Inhale 2 puffs into the lungs 2 (two) times daily. Patient taking differently: Inhale 2 puffs into the lungs 2 (two) times daily as needed (shortness breath or wheezing). 08/06/22   Gardenia Phlegm, MD  Cholecalciferol (VITAMIN D3) 20 MCG (800 UNIT) TABS Take 1 tablet by mouth daily. 08/07/22   Gardenia Phlegm, MD  conjugated estrogens (PREMARIN) vaginal cream Place 1 Applicatorful vaginally daily. 01/21/23   Lazaro Arms, MD  Docusate Sodium (DSS) 100 MG CAPS Take 100 mg by mouth daily as needed (constipation).    [provider]  DULoxetine (CYMBALTA) 30 MG capsule Take 2 capsules (60 mg total) by mouth daily. 11/07/22   Del Nigel Berthold, FNP  folic acid (FOLVITE) 1 MG tablet Take 1 mg by mouth daily.    [provider]  levocetirizine (XYZAL) 5 MG tablet Take 1 tablet (5 mg total) by mouth every evening. 11/07/22   Del Nigel Berthold, FNP  linaclotide [facility]) 145 MCG CAPS capsule Take 1 capsule (145 mcg total) by mouth daily before breakfast. 12/12/22   Letta Median, PA-C  Omega-3 Fatty Acids (FISH OIL) 300 MG CAPS Take by mouth.    [provider]  pantoprazole (PROTONIX) 40 MG tablet Take 1 tablet (40 mg total) by mouth 2 (two) times daily. 12/02/22 04/01/23  Letta Median, PA-C  rosuvastatin (CRESTOR) 5 MG tablet Take 1 tablet (5 mg total) by mouth daily. 11/16/22   Del Nigel Berthold, FNP  traZODone (DESYREL) 150 MG tablet Take 1-2 tablets (150-300 mg total) by mouth at bedtime. 11/07/22   Del Nigel Berthold, FNP  dicyclomine (BENTYL) 20 MG tablet Take one every 6 hours for  abd cramps Patient not taking: Reported on 09/30/2015 09/14/15 09/30/15  Bethann Berkshire, MD      Allergies    Dulcolax [bisacodyl], Flagyl [metronidazole], Latex, and Lithium    Review of Systems   Review of Systems  Genitourinary:  Negative for dysuria.  Musculoskeletal:  Positive for back pain.    Physical Exam Updated Vital Signs BP 125/75 (BP Location: Right Arm)   Pulse 71   Temp 98.9 F (37.2 C) (Oral)   Resp 19   Ht 5\' 5"  (1.651 m)   Wt 86.2 kg   LMP 08/19/2014   SpO2 98%   BMI 31.62 kg/m  Physical Exam Constitutional:      General: She is not in acute distress.    Appearance: She is well-developed. She is not toxic-appearing.  HENT:     Head:  Normocephalic and atraumatic.  Abdominal:     General: There is no distension.     Palpations: Abdomen is soft.     Tenderness: There is no abdominal tenderness.  Musculoskeletal:     Cervical back: Normal range of motion and neck supple. No spinous process tenderness or muscular tenderness.     Comments: No obvious deformity, appreciable swelling, erythema, ecchymosis, significant open wounds, or increased warmth.  Back: Tenderness to palpation paraspinal muscles of the spine bilaterally.  No palpable step off or crepitus.  Lower extremities: ranging @ all major joints. No focal bony tenderness .  Negative straight leg raise bilaterally.  No rash present  Skin:    General: Skin is warm and dry.     Findings: No rash.  Neurological:     Mental Status: She is alert.     Comments: Sensation grossly intact to bilateral lower extremities. 5/5 symmetric strength with plantar/dorsiflexion bilaterally.  Patient declined to walk for me.     ED Results / Procedures / Treatments   Labs (all labs ordered are listed, but only abnormal results are displayed) Labs Reviewed  URINALYSIS, ROUTINE W REFLEX MICROSCOPIC    EKG None  Radiology No results found.  Procedures Procedures    Medications Ordered in ED Medications  oxyCODONE-acetaminophen (PERCOCET/ROXICET) 5-325 MG per tablet 1 tablet (1 tablet Oral Given 02/16/23 1835)  predniSONE (DELTASONE) tablet 60 mg (60 mg Oral Given 02/16/23 1835)  cyclobenzaprine (FLEXERIL) tablet 5 mg (5 mg Oral Given 02/16/23 1840)    ED Course/ Medical Decision Making/ A&P                                 Medical Decision Making Patient is a 52 year old female, here for acute back pain, that occurred after lifting something at work the other day.  She is overall well-appearing.  Has no loss of bowel, bladder, groin anesthesia, neurosymptoms, fever, IV drug use, or steroid use.  No trauma.  Just hurts to move.  Will give her Percocet, prednisone, and  Flexeril for relief of her symptoms.  Given that she has no red flag symptoms, we will hold off on imaging.  Reassessed patient she is feeling much better, will discharge with prednisone, Flexeril, instructed to take Tylenol at home for pain control.  Return precautions emphasized  Amount and/or Complexity of Data Reviewed Labs: ordered.  Risk Prescription drug management.   Final Clinical Impression(s) / ED Diagnoses Final diagnoses:  Acute bilateral low back pain without sciatica    Rx / DC Orders ED Discharge Orders  Ordered    cyclobenzaprine (FLEXERIL) 10 MG tablet  2 times daily PRN        02/16/23 1931    predniSONE (STERAPRED UNI-PAK 21 TAB) 10 MG (21) TBPK tablet  Daily        02/16/23 1931              Nixxon Faria, Harley [place], PA 02/16/23 1947    Eber Hong, MD 02/17/23 [phone]

## 2023-02-16 NOTE — ED Notes (Signed)
ED PA at [facility]

## 2023-02-16 NOTE — ED Triage Notes (Signed)
C/o lower back pain x4 days and unable to walk due to pain.  Also reports urinary frequency Patient reports when trying to get up today that she feel in floor due to pain  Denies urinary/bowel incontinence.

## 2023-02-17 ENCOUNTER — Telehealth: Payer: Self-pay | Admitting: Acute Care

## 2023-02-17 ENCOUNTER — Other Ambulatory Visit: Payer: Self-pay

## 2023-02-17 DIAGNOSIS — F17210 Nicotine dependence, cigarettes, uncomplicated: Secondary | ICD-10-CM

## 2023-02-17 DIAGNOSIS — R911 Solitary pulmonary nodule: Secondary | ICD-10-CM

## 2023-02-17 NOTE — Telephone Encounter (Signed)
Called and left VM to review LDCT results with pt. Impression below.     IMPRESSION: 1. Lung-RADS 3, probably benign findings. Short-term follow-up in 6 months is recommended with repeat low-dose chest CT without contrast (please use the following order, CT CHEST LCS NODULE FOLLOW-UP W/O CM). 2. Aortic Atherosclerosis (ICD10-I70.0).     Electronically Signed   By: Allegra Lai M.D.   On: 02/14/2023 17:00

## 2023-02-17 NOTE — Telephone Encounter (Signed)
Spoke with patient by phone, using two patient identifiers, to review results of LDCT.  Results show lung nodule in left lung with recommendation for 6 months follow up as precaution. Likely benign finding. Patient states she had a nodule identified in [place] two years ago and she believes it was a 6mm nodule at that time. Patient in agreement with plan to repeat scan in 6 months.  Atherosclerosis noted as mild.  Patient does not take statin medication.  Advised she may want to discuss with PCP for any possible medication recommendations.  Results/plan routed to PCP and order placed for LDCT follow up 6 months.

## 2023-02-17 NOTE — Telephone Encounter (Signed)
Patient called office and left VM. Called patient back and left VM.

## 2023-02-18 ENCOUNTER — Encounter (HOSPITAL_COMMUNITY): Payer: Self-pay

## 2023-02-18 ENCOUNTER — Ambulatory Visit (HOSPITAL_COMMUNITY): Payer: 59 | Attending: Vascular Surgery

## 2023-02-22 ENCOUNTER — Encounter: Payer: Self-pay | Admitting: Gastroenterology

## 2023-02-22 DIAGNOSIS — J449 Chronic obstructive pulmonary disease, unspecified: Secondary | ICD-10-CM | POA: Diagnosis not present

## 2023-02-22 NOTE — Progress Notes (Deleted)
Referring Provider: Wylene Men* Primary Care Physician:  Rica Records, FNP Primary GI Physician: Dr. Marletta Lor  No chief complaint on file.   HPI:   Jennifer Rollins is a 52 y.o. female with history of anxiety, arthritis, cervical cancer, COPD, depression, fibromyalgia, IBS, constipation, GERD, reflux esophagitis, PUD on EGD in April 2024, chronic elevated alkaline phosphatase with extensive work-up detailed below, presenting today for follow-up ***  Last seen in the office 12/02/2022.  She reported ongoing postprandial upper abdominal pain and nausea which was felt to be secondary to known peptic ulcer disease as patient had continued taking ibuprofen fairly frequently.  She had completed 12-week course of pantoprazole and was now taking once daily.  Also with chronic constipation and postprandial bloating.  Suspected IBS-C and possible dietary intolerances contributing to bloating.  Plan included increasing pantoprazole to 40 mg twice daily, surveillance EGD, stop NSAIDs, start Linzess 145 mcg daily.  Consider gastric emptying study at next visit if persistent upper GI symptoms.  Regarding her chronically elevated alkaline phosphatase, etiology was not clear and planned to discuss with Dr. Marletta Lor.  Case was discussed with Dr. Marletta Lor who recommended referral to [facility] in [place].  She has an appointment on 03/21/2023.  EGD 12/30/2022 with normal esophagus, gastritis biopsied.  Recommended continuing current medications, Protonix 40 mg daily.   Patient message on 02/07/2023 with patient reporting rectal bleeding with bowel movements.  Recommended office visit for further evaluation and ER precautions were also given.  On 9/16, patient reported rectal bleeding had stopped and she felt that it was from an internal hemorrhoid.   Today: Rectal bleeding:     Last colonoscopy in April 2024 with normal exam.   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  Comment states differential includes nonhepatic tropic viral infection, drug or toxin induced liver injury, exposure to chemicals or solvents.   MRI abdomen with and without contrast 08/17/2021: Mildly enlarged liver, portions of the liver capsule slightly nodular, cannot exclude early changes of cirrhosis.  Normal spleen size.  No ascites.  No adenopathy.  No suspicious enhancing liver mass.  Variant cecal anatomy (located in the midline)  Labs 08/12/2022: Hepatitis A, B, and C labs were negative.  Recommended vaccination for hepatitis A and B. ANA, AMA, ASMA, IgG, IgM all within normal limits.  Mild elevation of IgA at 555. Alk phos 244 with liver fraction 70%, bone fraction 30%.     Abdominal ultrasound with elastography is 08/26/2022: Increased hepatic echogenicity, no definite nodularity, normal spleen, median K PA 4.3.    Past Medical History:  Diagnosis Date   Abnormal Pap smear of cervix    Allergy    Anemia    Anxiety    Arthritis    Benign essential hypertension 06/14/2020   Cancer (HCC) 2016   Cervical   Cataract 2022   Chronic back pain    Chronic pelvic pain in female    COPD (chronic obstructive pulmonary disease) (HCC)    Depression    Fibromyalgia    GERD (gastroesophageal reflux disease) 1990   HPV in female    Hypothyroidism 02/25/2020   IBS (irritable bowel syndrome)    NASH (nonalcoholic steatohepatitis)  PTSD (post-traumatic stress disorder)    Sciatica of right side    Sleep apnea 2022    Past Surgical History:  Procedure Laterality Date   ABDOMINAL HYSTERECTOMY N/A 09/14/2014   Procedure: HYSTERECTOMY ABDOMINAL;   Surgeon: Lazaro Arms, MD;  Location: [facility];  Service: Gynecology;  Laterality: N/A;   BIOPSY  08/29/2022   Procedure: BIOPSY;  Surgeon: Lanelle Bal, DO;  Location: [facility];  Service: Endoscopy;;   BIOPSY  12/30/2022   Procedure: BIOPSY;  Surgeon: Lanelle Bal, DO;  Location: [facility];  Service: Endoscopy;;   CESAREAN SECTION     CHOLECYSTECTOMY     COLONOSCOPY WITH PROPOFOL N/A 08/29/2022   Surgeon: Lanelle Bal, DO;  Normal exam.  Repeat in 10 years.   COLPOSCOPY VULVA W/ BIOPSY     ESOPHAGOGASTRODUODENOSCOPY (EGD) WITH PROPOFOL N/A 08/29/2022   Surgeon: Lanelle Bal, DO;   Severe reflux esophagitis biopsied, gastritis biopsied, nonbleeding gastric ulcer with no stigmata of bleeding, normal examined duodenum.  Gastric biopsies benign, no H. pylori.  Esophageal biopsies benign.   ESOPHAGOGASTRODUODENOSCOPY (EGD) WITH PROPOFOL N/A 12/30/2022   Procedure: ESOPHAGOGASTRODUODENOSCOPY (EGD) WITH PROPOFOL;  Surgeon: Lanelle Bal, DO;  Location: [facility];  Service: Endoscopy;  Laterality: N/A;  9:45 am, asa 3   EXCISIONAL HEMORRHOIDECTOMY     FRACTURE SURGERY  2022   Right forearm   right forearm surgery Right    SALPINGOOPHORECTOMY Bilateral 09/14/2014   Procedure: SALPINGO OOPHORECTOMY;  Surgeon: Lazaro Arms, MD;  Location: [facility];  Service: Gynecology;  Laterality: Bilateral;   TUBAL LIGATION      Current Outpatient Medications  Medication Sig Dispense Refill   albuterol (VENTOLIN HFA) 108 (90 Base) MCG/ACT inhaler Inhale 2 puffs into the lungs every 6 (six) hours as needed for wheezing or shortness of breath. 8 g 3   Budeson-Glycopyrrol-Formoterol (BREZTRI AEROSPHERE) 160-9-4.8 MCG/ACT AERO Inhale 2 puffs into the lungs 2 (two) times daily. (Patient taking differently: Inhale 2 puffs into the lungs 2 (two) times daily as needed (shortness breath or wheezing).) 10.7 g 11   Cholecalciferol (VITAMIN D3) 20 MCG (800 UNIT) TABS Take 1 tablet by mouth  daily. 75 tablet 1   conjugated estrogens (PREMARIN) vaginal cream Place 1 Applicatorful vaginally daily. 30 g 11   cyclobenzaprine (FLEXERIL) 10 MG tablet Take 1 tablet (10 mg total) by mouth 2 (two) times daily as needed for muscle spasms. 20 tablet 0   Docusate Sodium (DSS) 100 MG CAPS Take 100 mg by mouth daily as needed (constipation).     DULoxetine (CYMBALTA) 30 MG capsule Take 2 capsules (60 mg total) by mouth daily. 180 capsule 3   folic acid (FOLVITE) 1 MG tablet Take 1 mg by mouth daily.     levocetirizine (XYZAL) 5 MG tablet Take 1 tablet (5 mg total) by mouth every evening. 30 tablet 3   linaclotide (LINZESS) 145 MCG CAPS capsule Take 1 capsule (145 mcg total) by mouth daily before breakfast. 30 capsule 5   Omega-3 Fatty Acids (FISH OIL) 300 MG CAPS Take by mouth.     pantoprazole (PROTONIX) 40 MG tablet Take 1 tablet (40 mg total) by mouth 2 (two) times daily. 60 tablet 3   predniSONE (STERAPRED UNI-PAK 21 TAB) 10 MG (21) TBPK tablet Take by mouth daily. Take 6 tabs by mouth daily  for 1 days, then 5 tabs for 2 days, then 4 tabs for 2 days, then 3 tabs for 2 days,  2 tabs for 2 days, then 1 tab by mouth daily for 2 days 36 tablet 0   rosuvastatin (CRESTOR) 5 MG tablet Take 1 tablet (5 mg total) by mouth daily. 90 tablet 3   traZODone (DESYREL) 150 MG tablet Take 1-2 tablets (150-300 mg total) by mouth at bedtime. 90 tablet 3   No current facility-administered medications for this visit.    Allergies as of 02/24/2023 - Review Complete 02/16/2023  Allergen Reaction Noted   Dulcolax [bisacodyl] Nausea And Vomiting 08/27/2022   Flagyl [metronidazole] Itching 01/15/2015   Latex Other (See Comments) 02/08/2013   Lithium Itching 08/01/2022    Family History  Problem Relation Age of Onset   Diabetes Mother    Heart attack Mother    COPD Mother    Anxiety disorder Mother    Arthritis Mother    Cancer Mother    Depression Mother    Hearing loss Mother    Kidney disease Mother     Miscarriages / [place] Mother    Alcohol abuse Father    ADD / ADHD Brother    Depression Brother    Drug abuse Brother    Cirrhosis Maternal Grandfather        ETOH   Colon cancer Maternal Grandfather    Learning disabilities Son     Social History   Socioeconomic History   Marital status: Married    Spouse name: Not on file   Number of children: Not on file   Years of education: Not on file   Highest education level: GED or equivalent  Occupational History   Not on file  Tobacco Use   Smoking status: Every Day    Current packs/day: 1.50    Average packs/day: 1.5 packs/day for 30.0 years (45.0 ttl pk-yrs)    Types: Cigarettes   Smokeless tobacco: Never  Substance and Sexual Activity   Alcohol use: Not Currently    Comment: Never a heavy drinker   Drug use: Never    Types: Marijuana    Comment: occassional use -last used "months ago".   Sexual activity: Yes    Birth control/protection: Surgical  Other Topics Concern   Not on file  Social History Narrative   Not on file   Social Determinants of Health   Financial Resource Strain: Low Risk  (01/21/2023)   Overall Financial Resource Strain (CARDIA)    Difficulty of Paying Living Expenses: Not very hard  Recent Concern: Financial Resource Strain - Medium Risk (12/04/2022)   Overall Financial Resource Strain (CARDIA)    Difficulty of Paying Living Expenses: Somewhat hard  Food Insecurity: Food Insecurity Present (01/21/2023)   Hunger Vital Sign    Worried About Running Out of Food in the Last Year: Sometimes true    Ran Out of Food in the Last Year: Sometimes true  Transportation Needs: No Transportation Needs (01/21/2023)   PRAPARE - Administrator, Civil Service (Medical): No    Lack of Transportation (Non-Medical): No  Physical Activity: Inactive (01/21/2023)   Exercise Vital Sign    Days of Exercise per Week: 0 days    Minutes of Exercise per Session: 0 min  Stress: Stress Concern Present  (01/21/2023)   [employer] of Occupational Health - Occupational Stress Questionnaire    Feeling of Stress : To some extent  Social Connections: Socially Isolated (01/21/2023)   Social Connection and Isolation Panel [NHANES]    Frequency of Communication with Friends and Family: More than three times a week  Frequency of Social Gatherings with Friends and Family: Three times a week    Attends Religious Services: Never    Active Member of Clubs or Organizations: No    Attends Banker Meetings: Never    Marital Status: Separated    Review of Systems: Gen: Denies fever, chills, anorexia. Denies fatigue, weakness, weight loss.  CV: Denies chest pain, palpitations, syncope, peripheral edema, and claudication. Resp: Denies dyspnea at rest, cough, wheezing, coughing up blood, and pleurisy. GI: Denies vomiting blood, jaundice, and fecal incontinence.   Denies dysphagia or odynophagia. Derm: Denies rash, itching, dry skin Psych: Denies depression, anxiety, memory loss, confusion. No homicidal or suicidal ideation.  Heme: Denies bruising, bleeding, and enlarged lymph nodes.  Physical Exam: LMP 08/19/2014  General:   Alert and oriented. No distress noted. Pleasant and cooperative.  Head:  Normocephalic and atraumatic. Eyes:  Conjuctiva clear without scleral icterus. Heart:  S1, S2 present without murmurs appreciated. Lungs:  Clear to auscultation bilaterally. No wheezes, rales, or rhonchi. No distress.  Abdomen:  +BS, soft, non-tender and non-distended. No rebound or guarding. No HSM or masses noted. Msk:  Symmetrical without gross deformities. Normal posture. Extremities:  Without edema. Neurologic:  Alert and  oriented x4 Psych:  Normal mood and affect.    Assessment:     Plan:  ***   Ermalinda Memos, PA-C [facility] 02/24/2023

## 2023-02-24 ENCOUNTER — Encounter (INDEPENDENT_AMBULATORY_CARE_PROVIDER_SITE_OTHER): Payer: Self-pay

## 2023-02-24 ENCOUNTER — Ambulatory Visit: Payer: 59 | Admitting: Gastroenterology

## 2023-02-26 DIAGNOSIS — M9905 Segmental and somatic dysfunction of pelvic region: Secondary | ICD-10-CM | POA: Diagnosis not present

## 2023-02-26 DIAGNOSIS — M9902 Segmental and somatic dysfunction of thoracic region: Secondary | ICD-10-CM | POA: Diagnosis not present

## 2023-02-26 DIAGNOSIS — M546 Pain in thoracic spine: Secondary | ICD-10-CM | POA: Diagnosis not present

## 2023-02-26 DIAGNOSIS — M9903 Segmental and somatic dysfunction of lumbar region: Secondary | ICD-10-CM | POA: Diagnosis not present

## 2023-02-26 DIAGNOSIS — M62830 Muscle spasm of back: Secondary | ICD-10-CM | POA: Diagnosis not present

## 2023-02-28 DIAGNOSIS — M9905 Segmental and somatic dysfunction of pelvic region: Secondary | ICD-10-CM | POA: Diagnosis not present

## 2023-02-28 DIAGNOSIS — M9902 Segmental and somatic dysfunction of thoracic region: Secondary | ICD-10-CM | POA: Diagnosis not present

## 2023-02-28 DIAGNOSIS — M9903 Segmental and somatic dysfunction of lumbar region: Secondary | ICD-10-CM | POA: Diagnosis not present

## 2023-02-28 DIAGNOSIS — M62830 Muscle spasm of back: Secondary | ICD-10-CM | POA: Diagnosis not present

## 2023-02-28 DIAGNOSIS — M546 Pain in thoracic spine: Secondary | ICD-10-CM | POA: Diagnosis not present

## 2023-03-03 DIAGNOSIS — M9903 Segmental and somatic dysfunction of lumbar region: Secondary | ICD-10-CM | POA: Diagnosis not present

## 2023-03-03 DIAGNOSIS — M62830 Muscle spasm of back: Secondary | ICD-10-CM | POA: Diagnosis not present

## 2023-03-03 DIAGNOSIS — M9905 Segmental and somatic dysfunction of pelvic region: Secondary | ICD-10-CM | POA: Diagnosis not present

## 2023-03-03 DIAGNOSIS — M546 Pain in thoracic spine: Secondary | ICD-10-CM | POA: Diagnosis not present

## 2023-03-03 DIAGNOSIS — M9902 Segmental and somatic dysfunction of thoracic region: Secondary | ICD-10-CM | POA: Diagnosis not present

## 2023-03-08 NOTE — Progress Notes (Unsigned)
Referring Provider: Wylene Men* Primary Care Physician:  Rica Records, FNP Primary GI Physician: Dr. Marletta Lor  Chief Complaint  Patient presents with   Follow-up    Follow up. Pain in upper right side.     HPI:   Jennifer Rollins is a 52 y.o. female with history of anxiety, arthritis, cervical cancer, COPD, depression, fibromyalgia, IBS, constipation, GERD, reflux esophagitis, PUD on EGD in April 2024, chronic elevated alkaline phosphatase with extensive work-up detailed below, presenting today for follow-up.   Last seen in the office 12/02/2022.  She reported ongoing postprandial upper abdominal pain and nausea which was felt to be secondary to known peptic ulcer disease as patient had continued taking ibuprofen fairly frequently.  She had completed 12-week course of pantoprazole and was now taking once daily.  Also with chronic constipation and postprandial bloating.  Suspected IBS-C and possible dietary intolerances contributing to bloating.  Plan included increasing pantoprazole to 40 mg twice daily, surveillance EGD, stop NSAIDs, start Linzess 145 mcg daily.  Consider gastric emptying study at next visit if persistent upper GI symptoms.  Regarding her chronically elevated alkaline phosphatase, etiology was not clear and planned to discuss with Dr. Marletta Lor.   Case was discussed with Dr. Marletta Lor who recommended referral to [facility] in [place].  She has an appointment on 03/21/2023.   EGD 12/30/2022 with normal esophagus, gastritis biopsied.  Pathology with reactive gastropathy, minimal chronic gastritis.  Negative for H. pylori.  Recommended continuing current medications, Protonix 40 mg daily.    Patient message on 02/07/2023 with patient reporting rectal bleeding with bowel movements.  Recommended office visit for further evaluation and ER precautions were also given.  On 9/16, patient reported rectal bleeding had stopped and she felt that it was from an  internal hemorrhoid.     Today: Rectal bleeding: Initially thought she was peeing blood in September.  States she went to the bathroom and had a large amount of bright red blood into the toilet.  No bowel movement at that time.  Went back to the bathroom and had the same thing happen again, but realized the blood was coming from her rectum.  After that, no recurrent symptoms.  She did not have a bowel movement during that time.  Denies any associated abdominal pain or rectal pain.  Reports she does have hemorrhoids, external and internal.  Also reports history of internal hemorrhoid surgery.  She does not want a rectal exam today stating she did not take a bath this morning.  Reports she fluctuates from diuretic constipation.  Last week, she had loose/mushy stools after eating cereal.  She stopped eating cereal and reverted back to constipation.  She has not been taking Linzess because it causes abdominal bloating though she did take Linzess 2 days ago and had watery stool all day yesterday after Linzess.  Reports a total of 5 bowel movements.  No bowel movement today.  She used to take stool softeners which did allow her to have good bowel movements.  She is not sure why she stopped taking stool softeners.   Last colonoscopy in April 2024 with normal exam.     Upper abdominal pain/nausea: Never increased pantoprazole to twice daily.  Continue with RUQ pressure. Worse the last couple days. Foods feels like it sits in the epigastric area. This resolves after 4-5 hours. Nausea has resolved.  Eats 1 meals a day.  Taking 400 mg ibuprofen up to daily. Dealing with fibromyalgia and  double sciatica. PCP has said she wouldn't prescribe anything to help with pain. She is going to chiropractor.  Per review of office visit with PCP today, looks like IM Toradol was given and patient was also prescribed Robaxin.  Referral was also placed to neurosurgery.    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  Comment states differential includes nonhepatic tropic viral infection, drug or toxin induced liver injury, exposure to chemicals or solvents.   MRI abdomen with and without contrast 08/17/2021: Mildly enlarged liver, portions of the liver capsule slightly nodular, cannot exclude early changes of cirrhosis.  Normal spleen size.  No ascites.  No adenopathy.  No suspicious enhancing liver mass.  Variant cecal anatomy (located in the midline)   Labs 08/12/2022: Hepatitis A, B, and C labs were negative.  Recommended vaccination for hepatitis A and B. ANA, AMA, ASMA, IgG, IgM all within normal limits.  Mild elevation of IgA at 555. Alk phos 244 with liver fraction 70%, bone fraction 30%.     Abdominal ultrasound with elastography is 08/26/2022: Increased hepatic echogenicity, no definite nodularity, normal spleen, median K PA 4.3.     Past Medical History:  Diagnosis Date   Abnormal Pap smear of cervix    Allergy    Anemia    Anxiety    Arthritis    Benign essential hypertension 06/14/2020   Cancer (HCC) 2016   Cervical   Cataract 2022   Chronic back pain    Chronic pelvic pain in female    COPD (chronic obstructive pulmonary disease) (HCC)    Depression    Fibromyalgia    GERD (gastroesophageal reflux disease) 1990   HPV in female    Hypothyroidism 02/25/2020   IBS (irritable bowel syndrome)    NASH (nonalcoholic steatohepatitis)    PTSD (post-traumatic stress disorder)    Sciatica of right side    Sleep apnea 2022    Past Surgical History:  Procedure Laterality Date   ABDOMINAL HYSTERECTOMY N/A 09/14/2014   Procedure: HYSTERECTOMY ABDOMINAL;  Surgeon: Lazaro Arms, MD;  Location: [facility];  Service: Gynecology;  Laterality: N/A;   BIOPSY   08/29/2022   Procedure: BIOPSY;  Surgeon: Lanelle Bal, DO;  Location: [facility];  Service: Endoscopy;;   BIOPSY  12/30/2022   Procedure: BIOPSY;  Surgeon: Lanelle Bal, DO;  Location: [facility];  Service: Endoscopy;;   CESAREAN SECTION     CHOLECYSTECTOMY     COLONOSCOPY WITH PROPOFOL N/A 08/29/2022   Surgeon: Lanelle Bal, DO;  Normal exam.  Repeat in 10 years.   COLPOSCOPY VULVA W/ BIOPSY     ESOPHAGOGASTRODUODENOSCOPY (EGD) WITH PROPOFOL N/A 08/29/2022   Surgeon: Lanelle Bal, DO;   Severe reflux esophagitis biopsied, gastritis biopsied, nonbleeding gastric ulcer with no stigmata of bleeding, normal examined duodenum.  Gastric biopsies benign, no H. pylori.  Esophageal biopsies benign.   ESOPHAGOGASTRODUODENOSCOPY (EGD) WITH PROPOFOL N/A 12/30/2022   Procedure: ESOPHAGOGASTRODUODENOSCOPY (EGD) WITH PROPOFOL;  Surgeon: Lanelle Bal, DO;  Location: [facility];  Service: Endoscopy;  Laterality: N/A;  9:45 am, asa 3   EXCISIONAL HEMORRHOIDECTOMY     FRACTURE SURGERY  2022   Right forearm   right forearm surgery Right    SALPINGOOPHORECTOMY Bilateral  09/14/2014   Procedure: SALPINGO OOPHORECTOMY;  Surgeon: Lazaro Arms, MD;  Location: [facility];  Service: Gynecology;  Laterality: Bilateral;   TUBAL LIGATION      Current Outpatient Medications  Medication Sig Dispense Refill   albuterol (VENTOLIN HFA) 108 (90 Base) MCG/ACT inhaler Inhale 2 puffs into the lungs every 6 (six) hours as needed for wheezing or shortness of breath. 8 g 3   Budeson-Glycopyrrol-Formoterol (BREZTRI AEROSPHERE) 160-9-4.8 MCG/ACT AERO Inhale 2 puffs into the lungs 2 (two) times daily. (Patient taking differently: Inhale 2 puffs into the lungs 2 (two) times daily as needed (shortness breath or wheezing).) 10.7 g 11   Cholecalciferol (VITAMIN D3) 20 MCG (800 UNIT) TABS Take 1 tablet by mouth daily. 75 tablet 1   DULoxetine (CYMBALTA) 30 MG capsule Take 2 capsules (60 mg total) by mouth  daily. 180 capsule 3   folic acid (FOLVITE) 1 MG tablet Take 1 mg by mouth daily.     hydrocortisone (ANUSOL-HC) 2.5 % rectal cream Place 1 Application rectally 2 (two) times daily. 30 g 1   levocetirizine (XYZAL) 5 MG tablet Take 1 tablet (5 mg total) by mouth every evening. 30 tablet 3   linaclotide (LINZESS) 145 MCG CAPS capsule Take 1 capsule (145 mcg total) by mouth daily before breakfast. 30 capsule 5   methocarbamol (ROBAXIN-750) 750 MG tablet Take 1 tablet (750 mg total) by mouth every 8 (eight) hours as needed for muscle spasms. 30 tablet 2   Omega-3 Fatty Acids (FISH OIL) 300 MG CAPS Take by mouth.     pantoprazole (PROTONIX) 40 MG tablet Take 1 tablet (40 mg total) by mouth 2 (two) times daily. 60 tablet 3   traZODone (DESYREL) 150 MG tablet Take 1-2 tablets (150-300 mg total) by mouth at bedtime. 90 tablet 3   conjugated estrogens (PREMARIN) vaginal cream Place 1 Applicatorful vaginally daily. 30 g 11   Docusate Sodium (DSS) 100 MG CAPS Take 100 mg by mouth daily as needed (constipation).     predniSONE (STERAPRED UNI-PAK 21 TAB) 10 MG (21) TBPK tablet Take by mouth daily. Take 6 tabs by mouth daily  for 1 days, then 5 tabs for 2 days, then 4 tabs for 2 days, then 3 tabs for 2 days, 2 tabs for 2 days, then 1 tab by mouth daily for 2 days (Patient not taking: Reported on 03/10/2023) 36 tablet 0   No current facility-administered medications for this visit.    Allergies as of 03/10/2023 - Review Complete 03/10/2023  Allergen Reaction Noted   Dulcolax [bisacodyl] Nausea And Vomiting 08/27/2022   Flagyl [metronidazole] Itching 01/15/2015   Latex Other (See Comments) 02/08/2013   Lithium Itching 08/01/2022    Family History  Problem Relation Age of Onset   Diabetes Mother    Heart attack Mother    COPD Mother    Anxiety disorder Mother    Arthritis Mother    Cancer Mother    Depression Mother    Hearing loss Mother    Kidney disease Mother    Miscarriages / [place]  Mother    Alcohol abuse Father    ADD / ADHD Brother    Depression Brother    Drug abuse Brother    Cirrhosis Maternal Grandfather        ETOH   Colon cancer Maternal Grandfather    Learning disabilities Son     Social History   Socioeconomic History   Marital status: Married    Spouse name: Not on  file   Number of children: Not on file   Years of education: Not on file   Highest education level: GED or equivalent  Occupational History   Not on file  Tobacco Use   Smoking status: Every Day    Current packs/day: 1.50    Average packs/day: 1.5 packs/day for 30.0 years (45.0 ttl pk-yrs)    Types: Cigarettes   Smokeless tobacco: Never  Substance and Sexual Activity   Alcohol use: Not Currently    Comment: Never a heavy drinker   Drug use: Never    Types: Marijuana    Comment: occassional use -last used "months ago".   Sexual activity: Yes    Birth control/protection: Surgical  Other Topics Concern   Not on file  Social History Narrative   Not on file   Social Determinants of Health   Financial Resource Strain: Low Risk  (03/09/2023)   Overall Financial Resource Strain (CARDIA)    Difficulty of Paying Living Expenses: Not hard at all  Food Insecurity: No Food Insecurity (03/09/2023)   Hunger Vital Sign    Worried About Running Out of Food in the Last Year: Never true    Ran Out of Food in the Last Year: Never true  Recent Concern: Food Insecurity - Food Insecurity Present (01/21/2023)   Hunger Vital Sign    Worried About Running Out of Food in the Last Year: Sometimes true    Ran Out of Food in the Last Year: Sometimes true  Transportation Needs: No Transportation Needs (03/09/2023)   PRAPARE - Administrator, Civil Service (Medical): No    Lack of Transportation (Non-Medical): No  Physical Activity: Inactive (03/09/2023)   Exercise Vital Sign    Days of Exercise per Week: 0 days    Minutes of Exercise per Session: 0 min  Stress: Stress Concern  Present (03/09/2023)   [employer] of Occupational Health - Occupational Stress Questionnaire    Feeling of Stress : Very much  Social Connections: Moderately Isolated (03/09/2023)   Social Connection and Isolation Panel [NHANES]    Frequency of Communication with Friends and Family: More than three times a week    Frequency of Social Gatherings with Friends and Family: Once a week    Attends Religious Services: Never    Database administrator or Organizations: No    Attends Engineer, structural: Never    Marital Status: Married    Review of Systems: Gen: Denies fever, chills, cold or flulike symptoms, presyncope, syncope. CV: Denies chest pain, palpitations. Resp: Denies dyspnea, cough. GI: See HPI Heme: See HPI  Physical Exam: BP 107/68 (BP Location: Right Arm, Patient Position: Sitting, Cuff Size: Large)   Pulse 77   Temp 97.6 F (36.4 C) (Temporal)   Ht 5\' 5"  (1.651 m)   Wt 191 lb 9.6 oz (86.9 kg)   LMP 08/19/2014   SpO2 99%   BMI 31.88 kg/m  General:   Alert and oriented. No distress noted. Pleasant and cooperative.  Head:  Normocephalic and atraumatic. Eyes:  Conjuctiva clear without scleral icterus. Heart:  S1, S2 present without murmurs appreciated. Lungs:  Clear to auscultation bilaterally. No wheezes, rales, or rhonchi. No distress.  Abdomen:  +BS, soft, mild TTP across upper abdomen. No rebound or guarding. No HSM or masses noted. Rectal: Patient declined Msk:  Symmetrical without gross deformities. Normal posture. Extremities:  Without edema. Neurologic:  Alert and  oriented x4 Psych:  Normal mood and affect.  Assessment:  52 y.o. female with history of anxiety, arthritis, cervical cancer, COPD, depression, fibromyalgia, IBS, constipation, GERD, reflux esophagitis, PUD on EGD in April 2024, chronic elevated alkaline phosphatase with extensive workup detailed in HPI, presenting today for follow-up of upper abdominal pain, early satiety,  nausea, constipation.  Upper abdominal pain/nausea/early satiety: Ongoing upper abdominal discomfort, primarily in RUQ region that is a bit worse the last couple days as well as early satiety.  Prior nausea has resolved.  Suspect symptoms are at least in part secondary to gastritis in the setting of NSAID use.  She does have history of peptic ulcer disease, but surveillance EGD in August showed gastritis only with benign biopsies.  Previously recommended increasing pantoprazole to twice daily in July to see if this would improve her symptoms, but patient has continued taking pantoprazole once daily.  Additional consideration includes gastroparesis. She has history of cholecystectomy with no dilation of CBD on last [place] in April so doubt biliary etiology.   We will plan to increase pantoprazole to twice daily, arrange gastric emptying study and update labs including HFP and lipase that she has mild tenderness across the upper abdomen on exam today.  Alternating constipation and diarrhea: Likely with baseline constipation and intermittent diarrhea secondary to lactose intolerance.  Constipation not adequately managed.  Reports Linzess 145 mcg causes diarrhea and bloating.  Notes symptoms previously improved with stool softeners and she is not sure why she stopped taking them.  She prefers to return to stool softeners to see how she does.  Can consider trial of Trulance or Amitiza at a later date if stool softeners are not effective.  Rectal bleeding:  2 episodes of painless bright red blood per rectum  in September with blood in toilet water and no bowel movement. No recurrent symptoms.  Patient reports external and internal hemorrhoids though last colonoscopy in April 2024 did not mention any hemorrhoids, diverticula, or any other significant abnormalities.  Etiology of prior rectal bleeding is not clear.  It is possible that she may have developed hemorrhoids or diverticula in the setting of constipation  with subsequent bleeding.  Doubt development of significant polyp or malignancy with colonoscopy earlier this year.  Offered rectal exam today, but patient declined.  I have sent in Anusol rectal cream for patient to use as needed for hemorrhoids.  She will let me know if any recurrent rectal bleeding.  Will go ahead and update CBC to ensure she has not developed anemia.  Elevated alkaline phosphatase: Chronic.  Extensive evaluation detailed in HPI including liver biopsy in the past with no definitive diagnosis.  She has been referred to [facility] in [place] and has an appointment on 03/21/2023.   Plan:  Gastric emptying study CBC, HFP, lipase Increase pantoprazole to 40 mg twice daily. 4-6 small meals daily. Avoid fried, fatty, greasy, spicy foods, carbonated beverages, caffeine. Avoid NSAIDs is much as possible. Advised that she can discuss referral to pain management with PCP for fibromyalgia. Start Colace 200 mg every morning.  May increase to a total of 300 mg daily if needed.  Patient will let me know if any ongoing constipation. Avoid dairy or take Lactaid tablets prior. Anusol rectal cream as needed for possible hemorrhoids. Patient let me know if recurrent rectal bleeding.  ER precautions discussed. Keep upcoming appointment with [facility] in [place]. Follow-up in 3 months or sooner if needed.   Jennifer Memos, PA-C [facility] 03/10/2023

## 2023-03-10 ENCOUNTER — Other Ambulatory Visit (HOSPITAL_COMMUNITY)
Admission: RE | Admit: 2023-03-10 | Discharge: 2023-03-10 | Disposition: A | Discharge status: Home / Self Care | Payer: 59 | Source: Ambulatory Visit | Attending: Gastroenterology | Admitting: Gastroenterology

## 2023-03-10 ENCOUNTER — Encounter: Payer: Self-pay | Admitting: *Deleted

## 2023-03-10 ENCOUNTER — Ambulatory Visit (INDEPENDENT_AMBULATORY_CARE_PROVIDER_SITE_OTHER): Payer: 59 | Admitting: Family Medicine

## 2023-03-10 ENCOUNTER — Ambulatory Visit (INDEPENDENT_AMBULATORY_CARE_PROVIDER_SITE_OTHER): Payer: 59 | Admitting: Gastroenterology

## 2023-03-10 ENCOUNTER — Ambulatory Visit (HOSPITAL_COMMUNITY)
Admission: RE | Admit: 2023-03-10 | Discharge: 2023-03-10 | Disposition: A | Discharge status: Home / Self Care | Payer: 59 | Source: Ambulatory Visit | Attending: Family Medicine | Admitting: Family Medicine

## 2023-03-10 ENCOUNTER — Encounter: Payer: Self-pay | Admitting: Family Medicine

## 2023-03-10 ENCOUNTER — Encounter: Payer: Self-pay | Admitting: Gastroenterology

## 2023-03-10 VITALS — BP 122/80 | HR 87 | Ht 65.0 in | Wt 190.0 lb

## 2023-03-10 VITALS — BP 107/68 | HR 77 | Temp 97.6°F | Ht 65.0 in | Wt 191.6 lb

## 2023-03-10 DIAGNOSIS — R748 Abnormal levels of other serum enzymes: Secondary | ICD-10-CM

## 2023-03-10 DIAGNOSIS — R140 Abdominal distension (gaseous): Secondary | ICD-10-CM | POA: Diagnosis not present

## 2023-03-10 DIAGNOSIS — D509 Iron deficiency anemia, unspecified: Secondary | ICD-10-CM

## 2023-03-10 DIAGNOSIS — M79604 Pain in right leg: Secondary | ICD-10-CM | POA: Diagnosis not present

## 2023-03-10 DIAGNOSIS — Z136 Encounter for screening for cardiovascular disorders: Secondary | ICD-10-CM | POA: Diagnosis not present

## 2023-03-10 DIAGNOSIS — R1011 Right upper quadrant pain: Secondary | ICD-10-CM

## 2023-03-10 DIAGNOSIS — K5900 Constipation, unspecified: Secondary | ICD-10-CM | POA: Diagnosis not present

## 2023-03-10 DIAGNOSIS — M9905 Segmental and somatic dysfunction of pelvic region: Secondary | ICD-10-CM | POA: Diagnosis not present

## 2023-03-10 DIAGNOSIS — M9903 Segmental and somatic dysfunction of lumbar region: Secondary | ICD-10-CM | POA: Diagnosis not present

## 2023-03-10 DIAGNOSIS — F419 Anxiety disorder, unspecified: Secondary | ICD-10-CM

## 2023-03-10 DIAGNOSIS — M5442 Lumbago with sciatica, left side: Secondary | ICD-10-CM

## 2023-03-10 DIAGNOSIS — R197 Diarrhea, unspecified: Secondary | ICD-10-CM | POA: Diagnosis not present

## 2023-03-10 DIAGNOSIS — K625 Hemorrhage of anus and rectum: Secondary | ICD-10-CM | POA: Diagnosis not present

## 2023-03-10 DIAGNOSIS — G8929 Other chronic pain: Secondary | ICD-10-CM

## 2023-03-10 DIAGNOSIS — F3341 Major depressive disorder, recurrent, in partial remission: Secondary | ICD-10-CM

## 2023-03-10 DIAGNOSIS — R198 Other specified symptoms and signs involving the digestive system and abdomen: Secondary | ICD-10-CM

## 2023-03-10 DIAGNOSIS — M51362 Other intervertebral disc degeneration, lumbar region with discogenic back pain and lower extremity pain: Secondary | ICD-10-CM | POA: Diagnosis not present

## 2023-03-10 DIAGNOSIS — K2950 Unspecified chronic gastritis without bleeding: Secondary | ICD-10-CM | POA: Diagnosis not present

## 2023-03-10 DIAGNOSIS — T50995A Adverse effect of other drugs, medicaments and biological substances, initial encounter: Secondary | ICD-10-CM | POA: Diagnosis not present

## 2023-03-10 DIAGNOSIS — R6881 Early satiety: Secondary | ICD-10-CM | POA: Diagnosis not present

## 2023-03-10 DIAGNOSIS — M79605 Pain in left leg: Secondary | ICD-10-CM | POA: Diagnosis not present

## 2023-03-10 DIAGNOSIS — M5441 Lumbago with sciatica, right side: Secondary | ICD-10-CM

## 2023-03-10 DIAGNOSIS — R1010 Upper abdominal pain, unspecified: Secondary | ICD-10-CM

## 2023-03-10 DIAGNOSIS — M47816 Spondylosis without myelopathy or radiculopathy, lumbar region: Secondary | ICD-10-CM | POA: Diagnosis not present

## 2023-03-10 DIAGNOSIS — M62830 Muscle spasm of back: Secondary | ICD-10-CM | POA: Diagnosis not present

## 2023-03-10 DIAGNOSIS — M9902 Segmental and somatic dysfunction of thoracic region: Secondary | ICD-10-CM | POA: Diagnosis not present

## 2023-03-10 DIAGNOSIS — M546 Pain in thoracic spine: Secondary | ICD-10-CM | POA: Diagnosis not present

## 2023-03-10 LAB — HEPATIC FUNCTION PANEL
ALT: 22 U/L (ref 0–44)
AST: 22 U/L (ref 15–41)
Albumin: 3.6 g/dL (ref 3.5–5.0)
Alkaline Phosphatase: 191 U/L — ABNORMAL HIGH (ref 38–126)
Bilirubin, Direct: 0.1 mg/dL (ref 0.0–0.2)
Indirect Bilirubin: 0.3 mg/dL (ref 0.3–0.9)
Total Bilirubin: 0.4 mg/dL (ref 0.3–1.2)
Total Protein: 7.9 g/dL (ref 6.5–8.1)

## 2023-03-10 LAB — CBC WITH DIFFERENTIAL/PLATELET
Abs Immature Granulocytes: 0.02 10*3/uL (ref 0.00–0.07)
Basophils Absolute: 0 10*3/uL (ref 0.0–0.1)
Basophils Relative: 1 %
Eosinophils Absolute: 0.1 10*3/uL (ref 0.0–0.5)
Eosinophils Relative: 2 %
HCT: 45.6 % (ref 36.0–46.0)
Hemoglobin: 14.6 g/dL (ref 12.0–15.0)
Immature Granulocytes: 0 %
Lymphocytes Relative: 26 %
Lymphs Abs: 2.2 10*3/uL (ref 0.7–4.0)
MCH: 29.3 pg (ref 26.0–34.0)
MCHC: 32 g/dL (ref 30.0–36.0)
MCV: 91.4 fL (ref 80.0–100.0)
Monocytes Absolute: 0.8 10*3/uL (ref 0.1–1.0)
Monocytes Relative: 9 %
Neutro Abs: 5.2 10*3/uL (ref 1.7–7.7)
Neutrophils Relative %: 62 %
Platelets: 384 10*3/uL (ref 150–400)
RBC: 4.99 MIL/uL (ref 3.87–5.11)
RDW: 13.7 % (ref 11.5–15.5)
WBC: 8.4 10*3/uL (ref 4.0–10.5)
nRBC: 0 % (ref 0.0–0.2)

## 2023-03-10 LAB — LIPASE, BLOOD: Lipase: 34 U/L (ref 11–51)

## 2023-03-10 MED ORDER — METHOCARBAMOL 750 MG PO TABS
750.0000 mg | ORAL_TABLET | Freq: Three times a day (TID) | ORAL | 2 refills | Status: DC | PRN
Start: 2023-03-10 — End: 2023-08-11

## 2023-03-10 MED ORDER — KETOROLAC TROMETHAMINE 60 MG/2ML IM SOLN
60.0000 mg | Freq: Once | INTRAMUSCULAR | Status: AC
Start: 2023-03-10 — End: 2023-03-10
  Administered 2023-03-10: 60 mg via INTRAMUSCULAR

## 2023-03-10 MED ORDER — HYDROCORTISONE (PERIANAL) 2.5 % EX CREA: 1.0000 | TOPICAL_CREAM | Freq: Two times a day (BID) | CUTANEOUS | 1 refills | Status: DC

## 2023-03-10 NOTE — Assessment & Plan Note (Signed)
Flowsheet Row Office Visit from 03/10/2023 in [facility]  PHQ-9 Total Score 18       Referral placed to behavioral health services  Trazodone 150 mg and Cymbalta 60 mg daily  We discussed several non-pharmacological approaches to managing depression, including:  Establishing a consistent daily routine: This helps create structure and stability. Practicing mindfulness and relaxation techniques: Incorporating meditation, deep breathing exercises, or yoga to manage stress and improve emotional well-being. Engaging in regular physical activity: Aim for at least 30 minutes of exercise most days to boost mood and energy levels. Spending time outdoors: Exposure to natural light and fresh air can improve mental health. Building a support network: Encouraging social connections with friends, family, or support groups to reduce feelings of isolation. Prioritizing a balanced diet: Eating nutrient-rich foods while avoiding excessive amounts of processed foods, sugar, and unhealthy fats.   Patient verbally consented to [facility] services about presenting concerns and psychiatric consultation as appropriate. The services will be billed as appropriate for the patient.

## 2023-03-10 NOTE — Patient Instructions (Signed)

## 2023-03-10 NOTE — Assessment & Plan Note (Signed)
Xray ordered- awaiting results Toradol IM injection given today Trial on Robaxin for pain PRN Referral placed to neurosurgery We discussed the desired effects and potential side effects of the prescribed medication for back pain. Additionally, we reviewed non-pharmacological interventions, including the importance of rest, avoiding twisting, improper bending, and straining the lower back. I demonstrated proper body mechanics to prevent further injury and advised alternating between ice and heat therapy for relief. Stretching exercises for both the back and legs were recommended to improve flexibility and support recovery. The patient was advised to follow up if symptoms worsen or persist. The patient expressed understanding of the treatment plan, and all questions were thoroughly addressed.

## 2023-03-10 NOTE — Progress Notes (Signed)
Patient Office Visit   Subjective   Patient ID: Jennifer Rollins, female    DOB: [birth year]  Age: 52 y.o. MRN: [id number]  CC:  Chief Complaint  Patient presents with   Follow-up    Patient is here for chronic f/u. Complains of bilateral sciatica pain and muscle spasms in abdomen and calves.     HPI Jennifer Rollins 52 year old female,  presents to the clinic for chronic follow up. She  has a past medical history of Abnormal Pap smear of cervix, Allergy, Anemia, Anxiety, Arthritis, Benign essential hypertension (06/14/2020), Cancer (HCC) (2016), Cataract (2022), Chronic back pain, Chronic pelvic pain in female, COPD (chronic obstructive pulmonary disease) (HCC), Depression, Fibromyalgia, GERD (gastroesophageal reflux disease) (1990), HPV in female, Hypothyroidism (02/25/2020), IBS (irritable bowel syndrome), NASH (nonalcoholic steatohepatitis), PTSD (post-traumatic stress disorder), Sciatica of right side, and Sleep apnea (2022).  The patient has been experiencing chronic back pain, with the current episode persisting for over a year. The pain is constant and has been gradually worsening since its onset. It primarily affects the lumbar spine and sacroiliac regions. The pain is described as aching, cramping, stabbing, and shooting, with radiation to both the left and right thighs. The pain intensity is severe, rated at 10/10, and is particularly worse at night. The symptoms are aggravated by lying down, changes in position, sitting, and standing. The patient reports all-day stiffness, accompanied by leg pain, numbness, pelvic pain, tingling, and weakness. Notably, there are no signs of bladder or bowel incontinence. Risk factors include obesity. The patient has previously tried muscle relaxants, chiropractic care, and Cymbalta, but these treatments have provided no relief.      Outpatient Encounter Medications as of 03/10/2023  Medication Sig   albuterol (VENTOLIN HFA) 108 (90 Base) MCG/ACT  inhaler Inhale 2 puffs into the lungs every 6 (six) hours as needed for wheezing or shortness of breath.   Budeson-Glycopyrrol-Formoterol (BREZTRI AEROSPHERE) 160-9-4.8 MCG/ACT AERO Inhale 2 puffs into the lungs 2 (two) times daily. (Patient taking differently: Inhale 2 puffs into the lungs 2 (two) times daily as needed (shortness breath or wheezing).)   Cholecalciferol (VITAMIN D3) 20 MCG (800 UNIT) TABS Take 1 tablet by mouth daily.   conjugated estrogens (PREMARIN) vaginal cream Place 1 Applicatorful vaginally daily.   Docusate Sodium (DSS) 100 MG CAPS Take 100 mg by mouth daily as needed (constipation).   DULoxetine (CYMBALTA) 30 MG capsule Take 2 capsules (60 mg total) by mouth daily.   folic acid (FOLVITE) 1 MG tablet Take 1 mg by mouth daily.   levocetirizine (XYZAL) 5 MG tablet Take 1 tablet (5 mg total) by mouth every evening.   linaclotide (LINZESS) 145 MCG CAPS capsule Take 1 capsule (145 mcg total) by mouth daily before breakfast.   methocarbamol (ROBAXIN-750) 750 MG tablet Take 1 tablet (750 mg total) by mouth every 8 (eight) hours as needed for muscle spasms.   Omega-3 Fatty Acids (FISH OIL) 300 MG CAPS Take by mouth.   pantoprazole (PROTONIX) 40 MG tablet Take 1 tablet (40 mg total) by mouth 2 (two) times daily.   traZODone (DESYREL) 150 MG tablet Take 1-2 tablets (150-300 mg total) by mouth at bedtime.   [DISCONTINUED] rosuvastatin (CRESTOR) 5 MG tablet Take 1 tablet (5 mg total) by mouth daily.   predniSONE (STERAPRED UNI-PAK 21 TAB) 10 MG (21) TBPK tablet Take by mouth daily. Take 6 tabs by mouth daily  for 1 days, then 5 tabs for 2 days, then 4 tabs for 2 days, then  3 tabs for 2 days, 2 tabs for 2 days, then 1 tab by mouth daily for 2 days (Patient not taking: Reported on 03/10/2023)   [DISCONTINUED] cyclobenzaprine (FLEXERIL) 10 MG tablet Take 1 tablet (10 mg total) by mouth 2 (two) times daily as needed for muscle spasms. (Patient not taking: Reported on 03/10/2023)    [DISCONTINUED] dicyclomine (BENTYL) 20 MG tablet Take one every 6 hours for abd cramps (Patient not taking: Reported on 09/30/2015)   Facility-Administered Encounter Medications as of 03/10/2023  Medication   ketorolac (TORADOL) injection 60 mg    Past Surgical History:  Procedure Laterality Date   ABDOMINAL HYSTERECTOMY N/A 09/14/2014   Procedure: HYSTERECTOMY ABDOMINAL;  Surgeon: Lazaro Arms, MD;  Location: [facility];  Service: Gynecology;  Laterality: N/A;   BIOPSY  08/29/2022   Procedure: BIOPSY;  Surgeon: Lanelle Bal, DO;  [facility];  Service: Endoscopy;;   BIOPSY  12/30/2022   Procedure: BIOPSY;  Surgeon: Lanelle Bal, DO;  [facility];  Service: Endoscopy;;   CESAREAN SECTION     CHOLECYSTECTOMY     COLONOSCOPY WITH PROPOFOL N/A 08/29/2022   Surgeon: Lanelle Bal, DO;  Normal exam.  Repeat in 10 years.   COLPOSCOPY VULVA W/ BIOPSY     ESOPHAGOGASTRODUODENOSCOPY (EGD) WITH PROPOFOL N/A 08/29/2022   Surgeon: Lanelle Bal, DO;   Severe reflux esophagitis biopsied, gastritis biopsied, nonbleeding gastric ulcer with no stigmata of bleeding, normal examined duodenum.  Gastric biopsies benign, no H. pylori.  Esophageal biopsies benign.   ESOPHAGOGASTRODUODENOSCOPY (EGD) WITH PROPOFOL N/A 12/30/2022   Procedure: ESOPHAGOGASTRODUODENOSCOPY (EGD) WITH PROPOFOL;  Surgeon: Lanelle Bal, DO;  [facility];  Service: Endoscopy;  Laterality: N/A;  9:45 am, asa 3   EXCISIONAL HEMORRHOIDECTOMY     FRACTURE SURGERY  2022   Right forearm   right forearm surgery Right    SALPINGOOPHORECTOMY Bilateral 09/14/2014   Procedure: SALPINGO OOPHORECTOMY;  Surgeon: Lazaro Arms, MD;  Location: [facility];  Service: Gynecology;  Laterality: Bilateral;   TUBAL LIGATION      Review of Systems  Constitutional:  Negative for chills and fever.  Respiratory:  Negative for shortness of breath.   Cardiovascular:  Negative for chest pain.  Genitourinary:   Negative for dysuria.  Musculoskeletal:  Positive for back pain, joint pain and myalgias.  Psychiatric/Behavioral:  Positive for depression. The patient is nervous/anxious.       Objective    BP 122/80   Pulse 87   Ht 5\' 5"  (1.651 m)   Wt 190 lb (86.2 kg)   LMP 08/19/2014   SpO2 96%   BMI 31.62 kg/m   Physical Exam Vitals reviewed.  Constitutional:      General: She is not in acute distress.    Appearance: Normal appearance. She is not ill-appearing, toxic-appearing or diaphoretic.  HENT:     Head: Normocephalic.  Eyes:     General:        Right eye: No discharge.        Left eye: No discharge.     Conjunctiva/sclera: Conjunctivae normal.  Cardiovascular:     Rate and Rhythm: Normal rate.     Pulses: Normal pulses.     Heart sounds: Normal heart sounds.  Pulmonary:     Effort: Pulmonary effort is normal. No respiratory distress.     Breath sounds: Normal breath sounds.  Musculoskeletal:     Cervical back: Normal range of motion.  Lumbar back: Tenderness present. No swelling, edema or signs of trauma. Decreased range of motion. Positive right straight leg raise test and positive left straight leg raise test.  Skin:    General: Skin is warm and dry.     Capillary Refill: Capillary refill takes less than 2 seconds.  Neurological:     Mental Status: She is alert.  Psychiatric:        Mood and Affect: Mood normal.       Assessment & Plan:  Bilateral low back pain with bilateral sciatica, unspecified chronicity -     Methocarbamol; Take 1 tablet (750 mg total) by mouth every 8 (eight) hours as needed for muscle spasms.  Dispense: 30 tablet; Refill: 2 -     DG Lumbar Spine 2-3 Views; Future -     Ambulatory referral to Neurosurgery -     Ambulatory referral to Physical Therapy -     Ketorolac Tromethamine  Encounter for screening for cardiovascular disorders -     BMP8+eGFR -     Lipid panel  Iron deficiency anemia, unspecified iron deficiency anemia type -      Iron, TIBC and Ferritin Panel  Anxiety and depression -     Ambulatory referral to Behavioral Health  Chronic bilateral low back pain with bilateral sciatica Assessment & Plan: Xray ordered- awaiting results Toradol IM injection given today Trial on Robaxin for pain PRN Referral placed to neurosurgery We discussed the desired effects and potential side effects of the prescribed medication for back pain. Additionally, we reviewed non-pharmacological interventions, including the importance of rest, avoiding twisting, improper bending, and straining the lower back. I demonstrated proper body mechanics to prevent further injury and advised alternating between ice and heat therapy for relief. Stretching exercises for both the back and legs were recommended to improve flexibility and support recovery. The patient was advised to follow up if symptoms worsen or persist. The patient expressed understanding of the treatment plan, and all questions were thoroughly addressed.    MDD (major depressive disorder), recurrent, in partial remission [facility]) Assessment & Plan: Flowsheet Row Office Visit from 03/10/2023 in [facility]  PHQ-9 Total Score 18       Referral placed to behavioral health services  Trazodone 150 mg and Cymbalta 60 mg daily  We discussed several non-pharmacological approaches to managing depression, including:  Establishing a consistent daily routine: This helps create structure and stability. Practicing mindfulness and relaxation techniques: Incorporating meditation, deep breathing exercises, or yoga to manage stress and improve emotional well-being. Engaging in regular physical activity: Aim for at least 30 minutes of exercise most days to boost mood and energy levels. Spending time outdoors: Exposure to natural light and fresh air can improve mental health. Building a support network: Encouraging social connections with friends, family, or support groups  to reduce feelings of isolation. Prioritizing a balanced diet: Eating nutrient-rich foods while avoiding excessive amounts of processed foods, sugar, and unhealthy fats.   Patient verbally consented to [facility] services about presenting concerns and psychiatric consultation as appropriate. The services will be billed as appropriate for the patient.        Return in about 4 months (around 07/11/2023), or if symptoms worsen or fail to improve, for chronic follow-up, hyperlipidemia.   Cruzita Lederer Newman Nip, FNP

## 2023-03-10 NOTE — Patient Instructions (Addendum)
Please have blood work completed at [facility]  We will get you scheduled for a gastric emptying study at [facility] to further evaluate your upper abdominal discomfort, full sensation.  Increase pantoprazole to 40 mg twice daily for now to see if this will help with your upper abdominal discomfort.  Try eating 4-6 small meals daily.  Avoid fried, fatty, greasy, spicy foods.  Avoid carbonated beverages and caffeine.  Avoid all NSAID products is much as possible including ibuprofen, Aleve, Advil, BC powders, Goody powders, and anything that says "NSAID" on the package as these medications will cause inflammation within your GI tract.  Discussed referral to pain management with your primary care doctor for fibromyalgia/sciatica if your primary care doctor has no other recommendations for management.   It is possible that your rectal bleeding was secondary to hemorrhoids versus diverticular bleeding.  I will send in a prescription of Anusol rectal cream for you to use as needed if you have any recurrent rectal bleeding.  If you develop rectal bleeding that does not respond to Anusol, please let me know.  If you have persistent, large-volume rectal bleeding, you will need to proceed to the emergency room.  To control your constipation, takes 2 stool softeners in the morning.  You can increase this to a total of 3 stool softeners daily.  Let me know if any ongoing problems with constipation.  Avoid dairy products or take Lactaid tablets prior to dairy consumption to prevent diarrhea/loose stools.   I will plan to see back in the office in 3 months or sooner if needed.  It was good to see you again today!  Ermalinda Memos, PA-C [facility]

## 2023-03-11 ENCOUNTER — Telehealth: Payer: Self-pay | Admitting: Family Medicine

## 2023-03-11 ENCOUNTER — Other Ambulatory Visit: Payer: Self-pay | Admitting: Family Medicine

## 2023-03-11 LAB — LIPID PANEL
Chol/HDL Ratio: 3.6 {ratio} (ref 0.0–4.4)
Cholesterol, Total: 185 mg/dL (ref 100–199)
HDL: 51 mg/dL (ref >39)
LDL Chol Calc (NIH): 104 mg/dL — ABNORMAL HIGH (ref 0–99)
Triglycerides: 170 mg/dL — ABNORMAL HIGH (ref 0–149)
VLDL Cholesterol Cal: 30 mg/dL (ref 5–40)

## 2023-03-11 LAB — IRON,TIBC AND FERRITIN PANEL
Ferritin: 197 ng/mL — ABNORMAL HIGH (ref 15–150)
Iron Saturation: 22 % (ref 15–55)
Iron: 67 ug/dL (ref 27–159)
Total Iron Binding Capacity: 298 ug/dL (ref 250–450)
UIBC: 231 ug/dL (ref 131–425)

## 2023-03-11 LAB — BMP8+EGFR
BUN/Creatinine Ratio: 12 (ref 9–23)
BUN: 14 mg/dL (ref 6–24)
CO2: 23 mmol/L (ref 20–29)
Calcium: 9.5 mg/dL (ref 8.7–10.2)
Chloride: 100 mmol/L (ref 96–106)
Creatinine, Ser: 1.18 mg/dL — ABNORMAL HIGH (ref 0.57–1.00)
Glucose: 86 mg/dL (ref 70–99)
Potassium: 5.1 mmol/L (ref 3.5–5.2)
Sodium: 138 mmol/L (ref 134–144)
eGFR: 55 mL/min/{1.73_m2} — ABNORMAL LOW (ref >59)

## 2023-03-11 MED ORDER — ROSUVASTATIN CALCIUM 5 MG PO TABS: 5.0000 mg | ORAL_TABLET | Freq: Every day | ORAL | 3 refills | Status: DC

## 2023-03-11 NOTE — Telephone Encounter (Signed)
Wants a call back to discuss lab resutls

## 2023-03-13 ENCOUNTER — Encounter: Payer: Self-pay | Admitting: *Deleted

## 2023-03-13 ENCOUNTER — Encounter (HOSPITAL_COMMUNITY)
Admission: RE | Admit: 2023-03-13 | Discharge: 2023-03-13 | Disposition: A | Discharge status: Home / Self Care | Payer: 59 | Source: Ambulatory Visit | Attending: Gastroenterology | Admitting: Gastroenterology

## 2023-03-13 DIAGNOSIS — K30 Functional dyspepsia: Secondary | ICD-10-CM | POA: Diagnosis not present

## 2023-03-13 DIAGNOSIS — R109 Unspecified abdominal pain: Secondary | ICD-10-CM | POA: Diagnosis not present

## 2023-03-13 DIAGNOSIS — R6881 Early satiety: Secondary | ICD-10-CM | POA: Diagnosis not present

## 2023-03-13 DIAGNOSIS — R1010 Upper abdominal pain, unspecified: Secondary | ICD-10-CM | POA: Diagnosis not present

## 2023-03-13 MED ORDER — TECHNETIUM TC 99M SULFUR COLLOID
2.1000 | Freq: Once | INTRAVENOUS | Status: AC | PRN
  Administered 2023-03-13: 2.1 via ORAL

## 2023-03-20 ENCOUNTER — Ambulatory Visit (HOSPITAL_COMMUNITY): Payer: 59

## 2023-03-20 ENCOUNTER — Telehealth: Payer: Self-pay | Admitting: *Deleted

## 2023-03-20 NOTE — Telephone Encounter (Signed)
Transition Care Management Unsuccessful Follow-up Telephone Call  Date of discharge and from where:  [facility]  02/16/2023  Attempts:  1st Attempt  Reason for unsuccessful TCM follow-up call:  No answer/busy

## 2023-03-21 ENCOUNTER — Other Ambulatory Visit (HOSPITAL_COMMUNITY): Payer: Self-pay | Admitting: Nurse Practitioner

## 2023-03-21 DIAGNOSIS — R748 Abnormal levels of other serum enzymes: Secondary | ICD-10-CM

## 2023-03-21 DIAGNOSIS — K7469 Other cirrhosis of liver: Secondary | ICD-10-CM | POA: Diagnosis not present

## 2023-03-21 DIAGNOSIS — K7581 Nonalcoholic steatohepatitis (NASH): Secondary | ICD-10-CM

## 2023-03-21 NOTE — Progress Notes (Deleted)
Referring Physician:  Rica Records, FNP [id number] [address] [place],  [place] [address]  Primary Physician:  Rica Records, FNP  History of Present Illness: 03/21/2023*** Ms. Liany Symes has a history of HTN, COPD, FM, GERD, depression, hypothyroidism, IBS, PTSD, sleep apnea.   History of CA?***  She has history of chronic LBP with current episode lasting 1+ years. She has constant LBP with *** bilateral thigh pain. Pain is worse with laying down, sitting, standing. She has stiffness.    She was given robaxin by PCP.   Duration: *** Location: *** Quality: *** Severity: ***  Precipitating: aggravated by *** Modifying factors: made better by *** Weakness: none Timing: *** Bowel/Bladder Dysfunction: none  Conservative measures:  Physical therapy: ***  Multimodal medical therapy including regular antiinflammatories: cymbalta, flexeril, prednisone  Injections: *** epidural steroid injections  Past Surgery: ***  Laguanda Arrant has ***no symptoms of cervical myelopathy.  The symptoms are causing a significant impact on the patient's life.   Review of Systems:  A 10 point review of systems is negative, except for the pertinent positives and negatives detailed in the HPI.  Past Medical History: Past Medical History:  Diagnosis Date   Abnormal Pap smear of cervix    Allergy    Anemia    Anxiety    Arthritis    Benign essential hypertension 06/14/2020   Cancer (HCC) 2016   Cervical   Cataract 2022   Chronic back pain    Chronic pelvic pain in female    COPD (chronic obstructive pulmonary disease) (HCC)    Depression    Fibromyalgia    GERD (gastroesophageal reflux disease) 1990   HPV in female    Hypothyroidism 02/25/2020   IBS (irritable bowel syndrome)    NASH (nonalcoholic steatohepatitis)    PTSD (post-traumatic stress disorder)    Sciatica of right side    Sleep apnea 2022    Past Surgical History: Past Surgical History:   Procedure Laterality Date   ABDOMINAL HYSTERECTOMY N/A 09/14/2014   Procedure: HYSTERECTOMY ABDOMINAL;  Surgeon: Lazaro Arms, MD;  Location: [facility];  Service: Gynecology;  Laterality: N/A;   BIOPSY  08/29/2022   Procedure: BIOPSY;  Surgeon: Lanelle Bal, DO;  [facility];  Service: Endoscopy;;   BIOPSY  12/30/2022   Procedure: BIOPSY;  Surgeon: Lanelle Bal, DO;  [facility];  Service: Endoscopy;;   CESAREAN SECTION     CHOLECYSTECTOMY     COLONOSCOPY WITH PROPOFOL N/A 08/29/2022   Surgeon: Lanelle Bal, DO;  Normal exam.  Repeat in 10 years.   COLPOSCOPY VULVA W/ BIOPSY     ESOPHAGOGASTRODUODENOSCOPY (EGD) WITH PROPOFOL N/A 08/29/2022   Surgeon: Lanelle Bal, DO;   Severe reflux esophagitis biopsied, gastritis biopsied, nonbleeding gastric ulcer with no stigmata of bleeding, normal examined duodenum.  Gastric biopsies benign, no H. pylori.  Esophageal biopsies benign.   ESOPHAGOGASTRODUODENOSCOPY (EGD) WITH PROPOFOL N/A 12/30/2022   Procedure: ESOPHAGOGASTRODUODENOSCOPY (EGD) WITH PROPOFOL;  Surgeon: Lanelle Bal, DO;  [facility];  Service: Endoscopy;  Laterality: N/A;  9:45 am, asa 3   EXCISIONAL HEMORRHOIDECTOMY     FRACTURE SURGERY  2022   Right forearm   right forearm surgery Right    SALPINGOOPHORECTOMY Bilateral 09/14/2014   Procedure: SALPINGO OOPHORECTOMY;  Surgeon: Lazaro Arms, MD;  Location: [facility];  Service: Gynecology;  Laterality: Bilateral;   TUBAL LIGATION      Allergies:  Allergies as of 03/24/2023 - Review Complete 03/10/2023  Allergen Reaction Noted   Dulcolax [bisacodyl] Nausea And Vomiting 08/27/2022   Flagyl [metronidazole] Itching 01/15/2015   Latex Other (See Comments) 02/08/2013   Lithium Itching 08/01/2022    Medications: Outpatient Encounter Medications as of 03/24/2023  Medication Sig   rosuvastatin (CRESTOR) 5 MG tablet Take 1 tablet (5 mg total) by mouth daily.   albuterol (VENTOLIN  HFA) 108 (90 Base) MCG/ACT inhaler Inhale 2 puffs into the lungs every 6 (six) hours as needed for wheezing or shortness of breath.   Budeson-Glycopyrrol-Formoterol (BREZTRI AEROSPHERE) 160-9-4.8 MCG/ACT AERO Inhale 2 puffs into the lungs 2 (two) times daily. (Patient taking differently: Inhale 2 puffs into the lungs 2 (two) times daily as needed (shortness breath or wheezing).)   Cholecalciferol (VITAMIN D3) 20 MCG (800 UNIT) TABS Take 1 tablet by mouth daily.   conjugated estrogens (PREMARIN) vaginal cream Place 1 Applicatorful vaginally daily.   Docusate Sodium (DSS) 100 MG CAPS Take 100 mg by mouth daily as needed (constipation).   DULoxetine (CYMBALTA) 30 MG capsule Take 2 capsules (60 mg total) by mouth daily.   folic acid (FOLVITE) 1 MG tablet Take 1 mg by mouth daily.   hydrocortisone (ANUSOL-HC) 2.5 % rectal cream Place 1 Application rectally 2 (two) times daily.   levocetirizine (XYZAL) 5 MG tablet Take 1 tablet (5 mg total) by mouth every evening.   linaclotide (LINZESS) 145 MCG CAPS capsule Take 1 capsule (145 mcg total) by mouth daily before breakfast.   methocarbamol (ROBAXIN-750) 750 MG tablet Take 1 tablet (750 mg total) by mouth every 8 (eight) hours as needed for muscle spasms.   Omega-3 Fatty Acids (FISH OIL) 300 MG CAPS Take by mouth.   pantoprazole (PROTONIX) 40 MG tablet Take 1 tablet (40 mg total) by mouth 2 (two) times daily.   predniSONE (STERAPRED UNI-PAK 21 TAB) 10 MG (21) TBPK tablet Take by mouth daily. Take 6 tabs by mouth daily  for 1 days, then 5 tabs for 2 days, then 4 tabs for 2 days, then 3 tabs for 2 days, 2 tabs for 2 days, then 1 tab by mouth daily for 2 days (Patient not taking: Reported on 03/10/2023)   traZODone (DESYREL) 150 MG tablet Take 1-2 tablets (150-300 mg total) by mouth at bedtime.   [DISCONTINUED] dicyclomine (BENTYL) 20 MG tablet Take one every 6 hours for abd cramps (Patient not taking: Reported on 09/30/2015)   No facility-administered encounter  medications on file as of 03/24/2023.    Social History: Social History   Tobacco Use   Smoking status: Every Day    Current packs/day: 1.50    Average packs/day: 1.5 packs/day for 30.0 years (45.0 ttl pk-yrs)    Types: Cigarettes   Smokeless tobacco: Never  Substance Use Topics   Alcohol use: Not Currently    Comment: Never a heavy drinker   Drug use: Never    Types: Marijuana    Comment: occassional use -last used "months ago".    Family Medical History: Family History  Problem Relation Age of Onset   Diabetes Mother    Heart attack Mother    COPD Mother    Anxiety disorder Mother    Arthritis Mother    Cancer Mother    Depression Mother    Hearing loss Mother    Kidney disease Mother    Miscarriages / [place] Mother    Alcohol abuse Father    ADD / ADHD Brother  Depression Brother    Drug abuse Brother    Cirrhosis Maternal Grandfather        ETOH   Colon cancer Maternal Grandfather    Learning disabilities Son     Physical Examination: There were no vitals filed for this visit.  General: Patient is well developed, well nourished, calm, collected, and in no apparent distress. Attention to examination is appropriate.  Respiratory: Patient is breathing without any difficulty.   NEUROLOGICAL:     Awake, alert, oriented to person, place, and time.  Speech is clear and fluent. Fund of knowledge is appropriate.   Cranial Nerves: Pupils equal round and reactive to light.  Facial tone is symmetric.    *** ROM of cervical spine *** pain *** posterior cervical tenderness. *** tenderness in bilateral trapezial region.   *** ROM of lumbar spine *** pain *** posterior lumbar tenderness.   No abnormal lesions on exposed skin.   Strength: Side Biceps Triceps Deltoid Interossei Grip Wrist Ext. Wrist Flex.  R 5 5 5 5 5 5 5   L 5 5 5 5 5 5 5    Side Iliopsoas Quads Hamstring PF DF EHL  R 5 5 5 5 5 5   L 5 5 5 5 5 5    Reflexes are ***2+ and symmetric at  the biceps, brachioradialis, patella and achilles.   Hoffman's is absent.  Clonus is not present.   Bilateral upper and lower extremity sensation is intact to light touch.     Gait is normal.   ***No difficulty with tandem gait.    Medical Decision Making  Imaging: Lumbar xrays dated 03/10/23:  Mild lumbar spondylosis.   Radiology report for above xrays is not available.***  Assessment and Plan: Ms. Mcnabb is a pleasant 52 y.o. female has ***  Treatment options discussed with patient and following plan made:   - Order for physical therapy for *** spine ***. Patient to call to schedule appointment. *** - Continue current medications including ***. Reviewed dosing and side effects.  - Prescription for ***. Reviewed dosing and side effects. Take with food.  - Prescription for *** to take prn muscle spasms. Reviewed dosing and side effects. Discussed this can cause drowsiness.  - MRI of *** to further evaluate *** radiculopathy. No improvement time or medications (***).  - Referral to [facility] at [facility] to discuss possible *** injections.  - Will schedule phone visit to review MRI results once I get them back.   I spent a total of *** minutes in face-to-face and non-face-to-face activities related to this patient's care today including review of outside records, review of imaging, review of symptoms, physical exam, discussion of differential diagnosis, discussion of treatment options, and documentation.   Thank you for involving me in the care of this patient.   Drake Leach PA-C Dept. of Neurosurgery

## 2023-03-24 ENCOUNTER — Ambulatory Visit: Payer: 59 | Admitting: Orthopedic Surgery

## 2023-03-24 ENCOUNTER — Telehealth: Payer: Self-pay | Admitting: *Deleted

## 2023-03-24 DIAGNOSIS — J449 Chronic obstructive pulmonary disease, unspecified: Secondary | ICD-10-CM | POA: Diagnosis not present

## 2023-03-24 NOTE — Telephone Encounter (Signed)
Transition Care Management Unsuccessful Follow-up Telephone Call  Date of discharge and from where:  [facility] 02/16/2023  Attempts:  2nd attempt  Reason for unsuccessful TCM follow-up call:  No answer/busy

## 2023-03-29 ENCOUNTER — Ambulatory Visit (HOSPITAL_COMMUNITY)
Admission: RE | Admit: 2023-03-29 | Discharge: 2023-03-29 | Disposition: A | Discharge status: Home / Self Care | Payer: 59 | Source: Ambulatory Visit | Attending: Nurse Practitioner | Admitting: Nurse Practitioner

## 2023-03-29 DIAGNOSIS — K7460 Unspecified cirrhosis of liver: Secondary | ICD-10-CM | POA: Diagnosis not present

## 2023-03-29 DIAGNOSIS — K7581 Nonalcoholic steatohepatitis (NASH): Secondary | ICD-10-CM | POA: Insufficient documentation

## 2023-03-29 DIAGNOSIS — R748 Abnormal levels of other serum enzymes: Secondary | ICD-10-CM | POA: Diagnosis not present

## 2023-03-29 DIAGNOSIS — K7469 Other cirrhosis of liver: Secondary | ICD-10-CM | POA: Insufficient documentation

## 2023-03-29 DIAGNOSIS — K760 Fatty (change of) liver, not elsewhere classified: Secondary | ICD-10-CM | POA: Diagnosis not present

## 2023-03-29 DIAGNOSIS — N281 Cyst of kidney, acquired: Secondary | ICD-10-CM | POA: Diagnosis not present

## 2023-03-29 DIAGNOSIS — R160 Hepatomegaly, not elsewhere classified: Secondary | ICD-10-CM | POA: Diagnosis not present

## 2023-03-29 MED ORDER — GADOBUTROL 1 MMOL/ML IV SOLN
8.5000 mL | Freq: Once | INTRAVENOUS | Status: AC | PRN
Start: 2023-03-29 — End: 2023-03-29
  Administered 2023-03-29: 8.5 mL via INTRAVENOUS

## 2023-03-31 DIAGNOSIS — Z79899 Other long term (current) drug therapy: Secondary | ICD-10-CM | POA: Diagnosis not present

## 2023-03-31 DIAGNOSIS — J441 Chronic obstructive pulmonary disease with (acute) exacerbation: Secondary | ICD-10-CM | POA: Diagnosis not present

## 2023-03-31 DIAGNOSIS — I700 Atherosclerosis of aorta: Secondary | ICD-10-CM | POA: Diagnosis not present

## 2023-03-31 DIAGNOSIS — Z720 Tobacco use: Secondary | ICD-10-CM | POA: Diagnosis not present

## 2023-03-31 DIAGNOSIS — Z20822 Contact with and (suspected) exposure to covid-19: Secondary | ICD-10-CM | POA: Diagnosis not present

## 2023-03-31 DIAGNOSIS — R918 Other nonspecific abnormal finding of lung field: Secondary | ICD-10-CM | POA: Diagnosis not present

## 2023-03-31 DIAGNOSIS — R059 Cough, unspecified: Secondary | ICD-10-CM | POA: Diagnosis not present

## 2023-03-31 DIAGNOSIS — M797 Fibromyalgia: Secondary | ICD-10-CM | POA: Diagnosis not present

## 2023-04-01 ENCOUNTER — Encounter: Payer: Self-pay | Admitting: Orthopedic Surgery

## 2023-04-05 ENCOUNTER — Encounter (HOSPITAL_COMMUNITY): Payer: Self-pay

## 2023-04-05 ENCOUNTER — Other Ambulatory Visit: Payer: Self-pay

## 2023-04-05 ENCOUNTER — Emergency Department (HOSPITAL_COMMUNITY): Payer: 59

## 2023-04-05 ENCOUNTER — Emergency Department (HOSPITAL_COMMUNITY)
Admission: EM | Admit: 2023-04-05 | Discharge: 2023-04-05 | Disposition: A | Discharge status: Home / Self Care | Payer: 59 | Attending: Emergency Medicine | Admitting: Emergency Medicine

## 2023-04-05 DIAGNOSIS — Z7951 Long term (current) use of inhaled steroids: Secondary | ICD-10-CM | POA: Insufficient documentation

## 2023-04-05 DIAGNOSIS — J441 Chronic obstructive pulmonary disease with (acute) exacerbation: Secondary | ICD-10-CM | POA: Insufficient documentation

## 2023-04-05 DIAGNOSIS — R0602 Shortness of breath: Secondary | ICD-10-CM | POA: Diagnosis not present

## 2023-04-05 DIAGNOSIS — D72829 Elevated white blood cell count, unspecified: Secondary | ICD-10-CM | POA: Diagnosis not present

## 2023-04-05 DIAGNOSIS — R059 Cough, unspecified: Secondary | ICD-10-CM | POA: Diagnosis not present

## 2023-04-05 DIAGNOSIS — Z91040 Latex allergy status: Secondary | ICD-10-CM | POA: Diagnosis not present

## 2023-04-05 LAB — COMPREHENSIVE METABOLIC PANEL
ALT: 17 U/L (ref 0–44)
AST: 15 U/L (ref 15–41)
Albumin: 3.6 g/dL (ref 3.5–5.0)
Alkaline Phosphatase: 119 U/L (ref 38–126)
Anion gap: 9 (ref 5–15)
BUN: 25 mg/dL — ABNORMAL HIGH (ref 6–20)
CO2: 25 mmol/L (ref 22–32)
Calcium: 8.4 mg/dL — ABNORMAL LOW (ref 8.9–10.3)
Chloride: 102 mmol/L (ref 98–111)
Creatinine, Ser: 1.2 mg/dL — ABNORMAL HIGH (ref 0.44–1.00)
GFR, Estimated: 54 mL/min — ABNORMAL LOW (ref >60)
Glucose, Bld: 94 mg/dL (ref 70–99)
Potassium: 3.6 mmol/L (ref 3.5–5.1)
Sodium: 136 mmol/L (ref 135–145)
Total Bilirubin: 0.4 mg/dL (ref <1.2)
Total Protein: 6.8 g/dL (ref 6.5–8.1)

## 2023-04-05 LAB — CBC WITH DIFFERENTIAL/PLATELET
Abs Immature Granulocytes: 0.03 10*3/uL (ref 0.00–0.07)
Basophils Absolute: 0 10*3/uL (ref 0.0–0.1)
Basophils Relative: 0 %
Eosinophils Absolute: 0.1 10*3/uL (ref 0.0–0.5)
Eosinophils Relative: 1 %
HCT: 42.7 % (ref 36.0–46.0)
Hemoglobin: 13.5 g/dL (ref 12.0–15.0)
Immature Granulocytes: 0 %
Lymphocytes Relative: 33 %
Lymphs Abs: 4 10*3/uL (ref 0.7–4.0)
MCH: 28.5 pg (ref 26.0–34.0)
MCHC: 31.6 g/dL (ref 30.0–36.0)
MCV: 90.1 fL (ref 80.0–100.0)
Monocytes Absolute: 1 10*3/uL (ref 0.1–1.0)
Monocytes Relative: 8 %
Neutro Abs: 7.1 10*3/uL (ref 1.7–7.7)
Neutrophils Relative %: 58 %
Platelets: 391 10*3/uL (ref 150–400)
RBC: 4.74 MIL/uL (ref 3.87–5.11)
RDW: 13.4 % (ref 11.5–15.5)
WBC: 12.2 10*3/uL — ABNORMAL HIGH (ref 4.0–10.5)
nRBC: 0 % (ref 0.0–0.2)

## 2023-04-05 MED ORDER — IPRATROPIUM-ALBUTEROL 0.5-2.5 (3) MG/3ML IN SOLN
3.0000 mL | Freq: Once | RESPIRATORY_TRACT | Status: AC
  Administered 2023-04-05: 3 mL via RESPIRATORY_TRACT
  Filled 2023-04-05: qty 3

## 2023-04-05 MED ORDER — ALBUTEROL SULFATE (2.5 MG/3ML) 0.083% IN NEBU
INHALATION_SOLUTION | RESPIRATORY_TRACT | Status: AC
  Administered 2023-04-05: 2.5 mg
  Filled 2023-04-05: qty 3

## 2023-04-05 MED ORDER — PREDNISONE 10 MG PO TABS: 40.0000 mg | ORAL_TABLET | Freq: Every day | ORAL | 0 refills | Status: DC

## 2023-04-05 MED ORDER — DOXYCYCLINE HYCLATE 100 MG PO CAPS: 100.0000 mg | ORAL_CAPSULE | Freq: Two times a day (BID) | ORAL | 0 refills | Status: DC

## 2023-04-05 NOTE — Discharge Instructions (Addendum)
It was a pleasure taking care of you this afternoon. You were evaluated in the emergency department for persistent cough.  Laboratory work was performed showing a mild elevation in your white blood cell count which as discussed which could be due to an infection or your difficulty breathing. A chest x-ray was also performed showing no acute findings. You were given two breathing treatments and discharged on additional steroids and antibiotics.  Please be sure to complete this course in its entirety. If you experience any new or worsening symptoms including chest pain, shortness of breath, dizziness please return to the emergency department for further evaluation.

## 2023-04-05 NOTE — ED Provider Notes (Signed)
[place] EMERGENCY DEPARTMENT AT [facility] Provider Note   CSN: [id number] Arrival date & time: 04/05/23  1628     History  Chief Complaint  Patient presents with   Cough    Jennifer Rollins is a 52 y.o. female with COPD presents with 2-week onset of green productive cough.  She was evaluated at [facility] where viral panel was negative and a chest x-ray demonstrated possible bibasilar atelectasis.  She was told this was likely a COPD exacerbation and treated with breathing treatment discharged on Zithromax and steroids.  She notes temporary improvement however her symptoms have overall persisted.  He denies any chest pain, shortness of breath, or leg swelling.  Denies any fevers but does note she has been sweating at night the past 3 days.  No one at home has his current symptoms.  Cough      Home Medications Prior to Admission medications   Medication Sig Start Date End Date Taking? Authorizing Provider  doxycycline (VIBRAMYCIN) 100 MG capsule Take 1 capsule (100 mg total) by mouth 2 (two) times daily. 04/05/23  Yes Halford Decamp, PA-C  predniSONE (DELTASONE) 10 MG tablet Take 4 tablets (40 mg total) by mouth daily. 04/05/23  Yes Halford Decamp, PA-C  rosuvastatin (CRESTOR) 5 MG tablet Take 1 tablet (5 mg total) by mouth daily. 03/11/23   Del Nigel Berthold, FNP  albuterol (VENTOLIN HFA) 108 (90 Base) MCG/ACT inhaler Inhale 2 puffs into the lungs every 6 (six) hours as needed for wheezing or shortness of breath. 11/07/22   Del Nigel Berthold, FNP  Budeson-Glycopyrrol-Formoterol (BREZTRI AEROSPHERE) 160-9-4.8 MCG/ACT AERO Inhale 2 puffs into the lungs 2 (two) times daily. Patient taking differently: Inhale 2 puffs into the lungs 2 (two) times daily as needed (shortness breath or wheezing). 08/06/22   Gardenia Phlegm, MD  Cholecalciferol (VITAMIN D3) 20 MCG (800 UNIT) TABS Take 1 tablet by mouth daily. 08/07/22   Gardenia Phlegm, MD  conjugated estrogens  (PREMARIN) vaginal cream Place 1 Applicatorful vaginally daily. 01/21/23   Lazaro Arms, MD  Docusate Sodium (DSS) 100 MG CAPS Take 100 mg by mouth daily as needed (constipation).    [provider]  DULoxetine (CYMBALTA) 30 MG capsule Take 2 capsules (60 mg total) by mouth daily. 11/07/22   Del Nigel Berthold, FNP  folic acid (FOLVITE) 1 MG tablet Take 1 mg by mouth daily.    [provider]  hydrocortisone (ANUSOL-HC) 2.5 % rectal cream Place 1 Application rectally 2 (two) times daily. 03/10/23   Letta Median, PA-C  levocetirizine (XYZAL) 5 MG tablet Take 1 tablet (5 mg total) by mouth every evening. 11/07/22   Del Nigel Berthold, FNP  linaclotide [place]) 145 MCG CAPS capsule Take 1 capsule (145 mcg total) by mouth daily before breakfast. 12/12/22   Letta Median, PA-C  methocarbamol (ROBAXIN-750) 750 MG tablet Take 1 tablet (750 mg total) by mouth every 8 (eight) hours as needed for muscle spasms. 03/10/23   Del Newman Nip, Tenna Child, FNP  Omega-3 Fatty Acids (FISH OIL) 300 MG CAPS Take by mouth.    [provider]  pantoprazole (PROTONIX) 40 MG tablet Take 1 tablet (40 mg total) by mouth 2 (two) times daily. 12/02/22 04/01/23  Letta Median, PA-C  traZODone (DESYREL) 150 MG tablet Take 1-2 tablets (150-300 mg total) by mouth at bedtime. 11/07/22   Del Nigel Berthold, FNP  dicyclomine (BENTYL) 20 MG tablet Take one every 6  hours for abd cramps Patient not taking: Reported on 09/30/2015 09/14/15 09/30/15  Bethann Berkshire, MD      Allergies    Dulcolax [bisacodyl], Flagyl [metronidazole], Latex, and Lithium    Review of Systems   Review of Systems  Respiratory:  Positive for cough.     Physical Exam Updated Vital Signs BP 121/67   Pulse 65   Temp 98 F (36.7 C)   Resp 18   Ht 5\' 5"  (1.651 m)   Wt 86.2 kg   LMP 08/19/2014   SpO2 97%   BMI 31.62 kg/m  Physical Exam Vitals and nursing note reviewed.  Constitutional:      General: She  is not in acute distress.    Appearance: She is well-developed. She is ill-appearing.  HENT:     Head: Normocephalic and atraumatic.  Eyes:     Conjunctiva/sclera: Conjunctivae normal.  Cardiovascular:     Rate and Rhythm: Normal rate and regular rhythm.     Heart sounds: No murmur heard. Pulmonary:     Effort: Pulmonary effort is normal. No respiratory distress.     Breath sounds: Rhonchi present.  Abdominal:     Palpations: Abdomen is soft.     Tenderness: There is no abdominal tenderness.  Musculoskeletal:        General: No swelling.     Cervical back: Neck supple.     Right lower leg: No edema.     Left lower leg: No edema.  Skin:    General: Skin is warm and dry.     Capillary Refill: Capillary refill takes less than 2 seconds.  Neurological:     Mental Status: She is alert.  Psychiatric:        Mood and Affect: Mood normal.     ED Results / Procedures / Treatments   Labs (all labs ordered are listed, but only abnormal results are displayed) Labs Reviewed  CBC WITH DIFFERENTIAL/PLATELET - Abnormal; Notable for the following components:      Result Value   WBC 12.2 (*)    All other components within normal limits  COMPREHENSIVE METABOLIC PANEL - Abnormal; Notable for the following components:   BUN 25 (*)    Creatinine, Ser 1.20 (*)    Calcium 8.4 (*)    GFR, Estimated 54 (*)    All other components within normal limits    EKG EKG Interpretation Date/Time:  Saturday April 05 2023 16:43:03 EST Ventricular Rate:  65 PR Interval:  162 QRS Duration:  100 QT Interval:  438 QTC Calculation: 456 R Axis:   18  Text Interpretation: Sinus rhythm Low voltage, precordial leads RSR' in V1 or V2, right VCD or RVH Confirmed by Eber Hong ([id number]) on 04/05/2023 4:59:55 PM  Radiology DG Chest 2 View  Result Date: 04/05/2023 CLINICAL DATA:  Cough EXAM: CHEST - 2 VIEW COMPARISON:  Chest x-ray 03/31/2023 FINDINGS: The heart size and mediastinal contours are within  normal limits. Both lungs are clear. The visualized skeletal structures are unremarkable. IMPRESSION: No active cardiopulmonary disease. Electronically Signed   By: Darliss Cheney M.D.   On: 04/05/2023 19:42    Procedures Procedures    Medications Ordered in ED Medications  ipratropium-albuterol (DUONEB) 0.5-2.5 (3) MG/3ML nebulizer solution 3 mL (3 mLs Nebulization Given 04/05/23 1732)  ipratropium-albuterol (DUONEB) 0.5-2.5 (3) MG/3ML nebulizer solution 3 mL (3 mLs Nebulization Given 04/05/23 1921)  albuterol (PROVENTIL) (2.5 MG/3ML) 0.083% nebulizer solution (2.5 mg  Given 04/05/23 1925)    ED Course/  Medical Decision Making/ A&P Clinical Course as of 04/05/23 2025  Sat Apr 05, 2023  1706 Noted rhonchi on exam.  No increased work of breathing.  Will start with basic labs, chest x-ray and DuoNeb. [JT]  1836 Patient status post 1 DuoNeb.  Still rhonchorous on exam we will proceed with second DuoNeb.  WBC noted of 12.2.  Patient afebrile this time. [JT]    Clinical Course User Index [JT] Halford Decamp, PA-C                                 Medical Decision Making    Patient presents to the ED for concern of productive cough, this involves an extensive number of treatment options, and is a complaint that carries with it a high risk of complications and morbidity.  The differential diagnosis includes pneumonia, COPD exacerbation, CHF, bronchitis, PE.  Co morbidities that complicate the patient evaluation  COPD   Additional history obtained:  Additional history and external records include prior notes from [place] this past week   Lab Tests:  I Ordered, and personally interpreted labs.  The pertinent results include: Mild leukocytosis 12.2.  Patient is afebrile.  Imaging Studies ordered:  I ordered imaging studies including chest x-ray I independently visualized and interpreted imaging which showed no acute cardiopulmonary disease I agree with the radiologist  interpretation   Cardiac Monitoring:  The patient was maintained on a cardiac monitor.  I personally viewed and interpreted the cardiac monitored which showed an underlying rhythm of: NSR without ischemic changes   Medicines ordered and prescription drug management:  I ordered medication including duoneb  for presumed COPD exacerbation with rhonchi on exam  Reevaluation of the patient after these medicines showed that the patient's symptoms mildly improved.  Still rhonchorous on exam, given second DuoNeb. I have reviewed the patients home medicines and have made adjustments as needed   Test Considered:  CT scan, she is PERC negative, low suspicion for PE   Critical Interventions:  Patient given DuoNeb given scattered rhonchi on exam   Consultations Obtained:  Not indicated   Problem List / ED Course:  Patient given 2 DuoNeb's during the course of her ED visit.  On exam patient is still mildly rhonchorous, without increased work of breathing, satting upper 90s on room air.  Requesting to go home, stating feeling improved.   Reevaluation:  After the interventions noted above, I reevaluated the patient and found that they have :improved   Social Determinants of Health:  None   Dispostion:  After consideration of the diagnostic results and the patients response to treatment, I feel that the patent would benefit from discharge home 5-day course of 40 mg prednisone and 70 course of Doxy.  Strict return precautions reviewed.         Final Clinical Impression(s) / ED Diagnoses Final diagnoses:  Chronic obstructive pulmonary disease with acute exacerbation (HCC)    Rx / DC Orders ED Discharge Orders          Ordered    doxycycline (VIBRAMYCIN) 100 MG capsule  2 times daily        04/05/23 2009    predniSONE (DELTASONE) 10 MG tablet  Daily        04/05/23 2009              Fabienne Bruns 04/05/23 2026    Eber Hong, MD 04/07/23  1024

## 2023-04-05 NOTE — ED Triage Notes (Signed)
Seen in [place] 5 days ago Given steroids and ABX COVID and flu negative  Told she has COPD  Complains of cough, chill, being tired and B/L ear pain  Denies CP and SOB 1 PPD smoker

## 2023-04-14 NOTE — Progress Notes (Deleted)
Office Note     CC:  *** Requesting Provider:  Wylene Men*  HPI: Jennifer Rollins is a 52 y.o. (Jun 15, 1970) female who presents at the request of Del Nigel Berthold, FNP for evaluation of ***.   Venous symptoms include: positive if (X) [  ] aching [  ] heavy [  ] tired  [  ] throbbing [  ] burning  [  ] itching [  ]swelling [  ] bleeding [  ] ulcer  Onset/duration:  ***  Occupation:  *** Aggravating factors: (sitting, standing) Alleviating factors: (elevation) Compression:  *** Helps:  *** Pain medications:  *** Previous vein procedures:  *** History of DVT:  ***   The pt *** on a statin for cholesterol management.  The pt *** on a daily aspirin.   Other AC:  *** The pt *** on *** for hypertension.   The pt *** diabetic.  *** Tobacco hx:  ***  Past Medical History:  Diagnosis Date   Abnormal Pap smear of cervix    Allergy    Anemia    Anxiety    Arthritis    Benign essential hypertension 06/14/2020   Cancer (HCC) 2016   Cervical   Cataract 2022   Chronic back pain    Chronic pelvic pain in female    COPD (chronic obstructive pulmonary disease) (HCC)    Depression    Fibromyalgia    GERD (gastroesophageal reflux disease) 1990   HPV in female    Hypothyroidism 02/25/2020   IBS (irritable bowel syndrome)    NASH (nonalcoholic steatohepatitis)    PTSD (post-traumatic stress disorder)    Sciatica of right side    Sleep apnea 2022    Past Surgical History:  Procedure Laterality Date   ABDOMINAL HYSTERECTOMY N/A 09/14/2014   Procedure: HYSTERECTOMY ABDOMINAL;  Surgeon: Lazaro Arms, MD;  Location: [facility];  Service: Gynecology;  Laterality: N/A;   BIOPSY  08/29/2022   Procedure: BIOPSY;  Surgeon: Lanelle Bal, DO;  Location: [facility];  Service: Endoscopy;;   BIOPSY  12/30/2022   Procedure: BIOPSY;  Surgeon: Lanelle Bal, DO;  Location: [facility];  Service: Endoscopy;;   CESAREAN SECTION     CHOLECYSTECTOMY      COLONOSCOPY WITH PROPOFOL N/A 08/29/2022   Surgeon: Lanelle Bal, DO;  Normal exam.  Repeat in 10 years.   COLPOSCOPY VULVA W/ BIOPSY     ESOPHAGOGASTRODUODENOSCOPY (EGD) WITH PROPOFOL N/A 08/29/2022   Surgeon: Lanelle Bal, DO;   Severe reflux esophagitis biopsied, gastritis biopsied, nonbleeding gastric ulcer with no stigmata of bleeding, normal examined duodenum.  Gastric biopsies benign, no H. pylori.  Esophageal biopsies benign.   ESOPHAGOGASTRODUODENOSCOPY (EGD) WITH PROPOFOL N/A 12/30/2022   Procedure: ESOPHAGOGASTRODUODENOSCOPY (EGD) WITH PROPOFOL;  Surgeon: Lanelle Bal, DO;  Location: [facility];  Service: Endoscopy;  Laterality: N/A;  9:45 am, asa 3   EXCISIONAL HEMORRHOIDECTOMY     FRACTURE SURGERY  2022   Right forearm   right forearm surgery Right    SALPINGOOPHORECTOMY Bilateral 09/14/2014   Procedure: SALPINGO OOPHORECTOMY;  Surgeon: Lazaro Arms, MD;  Location: [facility];  Service: Gynecology;  Laterality: Bilateral;   TUBAL LIGATION      Social History   Socioeconomic History   Marital status: Married    Spouse name: Not on file   Number of children: Not on file   Years of education: Not on file   Highest education level: GED  or equivalent  Occupational History   Not on file  Tobacco Use   Smoking status: Every Day    Current packs/day: 1.50    Average packs/day: 1.5 packs/day for 30.0 years (45.0 ttl pk-yrs)    Types: Cigarettes   Smokeless tobacco: Never  Substance and Sexual Activity   Alcohol use: Not Currently    Comment: Never a heavy drinker   Drug use: Never    Types: Marijuana    Comment: occassional use -last used "months ago".   Sexual activity: Yes    Birth control/protection: Surgical  Other Topics Concern   Not on file  Social History Narrative   Not on file   Social Determinants of Health   Financial Resource Strain: Low Risk  (03/09/2023)   Overall Financial Resource Strain (CARDIA)    Difficulty of Paying Living  Expenses: Not hard at all  Food Insecurity: Medium Risk (03/21/2023)   Received from [facility]   Hunger Vital Sign    Worried About Running Out of Food in the Last Year: Never true    Ran Out of Food in the Last Year: Sometimes true  Transportation Needs: No Transportation Needs (03/21/2023)   Received from [facility]    In the past 12 months, has lack of reliable transportation kept you from medical appointments, meetings, work or from getting things needed for daily living? : No  Physical Activity: Inactive (03/09/2023)   Exercise Vital Sign    Days of Exercise per Week: 0 days    Minutes of Exercise per Session: 0 min  Stress: Stress Concern Present (03/09/2023)   [employer] of Occupational Health - Occupational Stress Questionnaire    Feeling of Stress : Very much  Social Connections: Moderately Isolated (03/09/2023)   Social Connection and Isolation Panel [NHANES]    Frequency of Communication with Friends and Family: More than three times a week    Frequency of Social Gatherings with Friends and Family: Once a week    Attends Religious Services: Never    Database administrator or Organizations: No    Attends Banker Meetings: Never    Marital Status: Married  Catering manager Violence: Not At Risk (01/21/2023)   Humiliation, Afraid, Rape, and Kick questionnaire    Fear of Current or Ex-Partner: No    Emotionally Abused: No    Physically Abused: No    Sexually Abused: No   *** Family History  Problem Relation Age of Onset   Diabetes Mother    Heart attack Mother    COPD Mother    Anxiety disorder Mother    Arthritis Mother    Cancer Mother    Depression Mother    Hearing loss Mother    Kidney disease Mother    Miscarriages / [place] Mother    Alcohol abuse Father    ADD / ADHD Brother    Depression Brother    Drug abuse Brother    Cirrhosis Maternal Grandfather        ETOH   Colon cancer Maternal Grandfather     Learning disabilities Son     Current Outpatient Medications  Medication Sig Dispense Refill   rosuvastatin (CRESTOR) 5 MG tablet Take 1 tablet (5 mg total) by mouth daily. 90 tablet 3   albuterol (VENTOLIN HFA) 108 (90 Base) MCG/ACT inhaler Inhale 2 puffs into the lungs every 6 (six) hours as needed for wheezing or shortness of breath. 8 g 3   Budeson-Glycopyrrol-Formoterol (BREZTRI  AEROSPHERE) 160-9-4.8 MCG/ACT AERO Inhale 2 puffs into the lungs 2 (two) times daily. (Patient taking differently: Inhale 2 puffs into the lungs 2 (two) times daily as needed (shortness breath or wheezing).) 10.7 g 11   Cholecalciferol (VITAMIN D3) 20 MCG (800 UNIT) TABS Take 1 tablet by mouth daily. 75 tablet 1   conjugated estrogens (PREMARIN) vaginal cream Place 1 Applicatorful vaginally daily. 30 g 11   Docusate Sodium (DSS) 100 MG CAPS Take 100 mg by mouth daily as needed (constipation).     doxycycline (VIBRAMYCIN) 100 MG capsule Take 1 capsule (100 mg total) by mouth 2 (two) times daily. 14 capsule 0   DULoxetine (CYMBALTA) 30 MG capsule Take 2 capsules (60 mg total) by mouth daily. 180 capsule 3   folic acid (FOLVITE) 1 MG tablet Take 1 mg by mouth daily.     hydrocortisone (ANUSOL-HC) 2.5 % rectal cream Place 1 Application rectally 2 (two) times daily. 30 g 1   levocetirizine (XYZAL) 5 MG tablet Take 1 tablet (5 mg total) by mouth every evening. 30 tablet 3   linaclotide (LINZESS) 145 MCG CAPS capsule Take 1 capsule (145 mcg total) by mouth daily before breakfast. 30 capsule 5   methocarbamol (ROBAXIN-750) 750 MG tablet Take 1 tablet (750 mg total) by mouth every 8 (eight) hours as needed for muscle spasms. 30 tablet 2   Omega-3 Fatty Acids (FISH OIL) 300 MG CAPS Take by mouth.     pantoprazole (PROTONIX) 40 MG tablet Take 1 tablet (40 mg total) by mouth 2 (two) times daily. 60 tablet 3   predniSONE (DELTASONE) 10 MG tablet Take 4 tablets (40 mg total) by mouth daily. 20 tablet 0   traZODone (DESYREL) 150  MG tablet Take 1-2 tablets (150-300 mg total) by mouth at bedtime. 90 tablet 3   No current facility-administered medications for this visit.    Allergies  Allergen Reactions   Dulcolax [Bisacodyl] Nausea And Vomiting   Flagyl [Metronidazole] Itching   Latex Other (See Comments)   Lithium Itching     REVIEW OF SYSTEMS:  *** [X]  denotes positive finding, [ ]  denotes negative finding Cardiac  Comments:  Chest pain or chest pressure:    Shortness of breath upon exertion:    Short of breath when lying flat:    Irregular heart rhythm:        Vascular    Pain in calf, thigh, or hip brought on by ambulation:    Pain in feet at night that wakes you up from your sleep:     Blood clot in your veins:    Leg swelling:         Pulmonary    Oxygen at home:    Productive cough:     Wheezing:         Neurologic    Sudden weakness in arms or legs:     Sudden numbness in arms or legs:     Sudden onset of difficulty speaking or slurred speech:    Temporary loss of vision in one eye:     Problems with dizziness:         Gastrointestinal    Blood in stool:     Vomited blood:         Genitourinary    Burning when urinating:     Blood in urine:        Psychiatric    Major depression:         Hematologic    Bleeding  problems:    Problems with blood clotting too easily:        Skin    Rashes or ulcers:        Constitutional    Fever or chills:      PHYSICAL EXAMINATION:  There were no vitals filed for this visit.  General:  WDWN in NAD; vital signs documented above Gait: Not observed HENT: WNL, normocephalic Pulmonary: normal non-labored breathing , without Rales, rhonchi,  wheezing Cardiac: {Desc; regular/irreg:14544} HR, without  Murmurs {With/Without:20273} carotid bruit*** Abdomen: soft, NT, no masses Skin: {With/Without:20273} rashes Vascular Exam/Pulses:  Right Left  Radial {Exam; arterial pulse strength 0-4:30167} {Exam; arterial pulse strength 0-4:30167}   Ulnar {Exam; arterial pulse strength 0-4:30167} {Exam; arterial pulse strength 0-4:30167}  Femoral {Exam; arterial pulse strength 0-4:30167} {Exam; arterial pulse strength 0-4:30167}  Popliteal {Exam; arterial pulse strength 0-4:30167} {Exam; arterial pulse strength 0-4:30167}  DP {Exam; arterial pulse strength 0-4:30167} {Exam; arterial pulse strength 0-4:30167}  PT {Exam; arterial pulse strength 0-4:30167} {Exam; arterial pulse strength 0-4:30167}   Extremities: {With/Without:20273} ischemic changes, {With/Without:20273} Gangrene , {With/Without:20273} cellulitis; {With/Without:20273} open wounds;  Musculoskeletal: no muscle wasting or atrophy  Neurologic: A&O X 3;  No focal weakness or paresthesias are detected Psychiatric:  The pt has {Desc; normal/abnormal:11317::"Normal"} affect.   Non-Invasive Vascular Imaging:   ***    ASSESSMENT/PLAN:: 52 y.o. female presenting with ***   ***   Victorino Sparrow, MD [facility] [phone]

## 2023-04-15 ENCOUNTER — Ambulatory Visit (HOSPITAL_COMMUNITY): Payer: 59

## 2023-04-15 ENCOUNTER — Encounter: Payer: 59 | Admitting: Vascular Surgery

## 2023-04-18 ENCOUNTER — Telehealth: Payer: Self-pay | Admitting: *Deleted

## 2023-04-18 NOTE — Telephone Encounter (Signed)
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

## 2023-04-24 DIAGNOSIS — J449 Chronic obstructive pulmonary disease, unspecified: Secondary | ICD-10-CM | POA: Diagnosis not present

## 2023-05-01 ENCOUNTER — Encounter: Payer: Self-pay | Admitting: Gastroenterology

## 2023-05-23 ENCOUNTER — Other Ambulatory Visit: Payer: Self-pay | Admitting: Gastroenterology

## 2023-05-23 ENCOUNTER — Telehealth: Payer: Self-pay | Admitting: Gastroenterology

## 2023-05-23 DIAGNOSIS — R110 Nausea: Secondary | ICD-10-CM

## 2023-05-23 DIAGNOSIS — K279 Peptic ulcer, site unspecified, unspecified as acute or chronic, without hemorrhage or perforation: Secondary | ICD-10-CM

## 2023-05-23 DIAGNOSIS — R1010 Upper abdominal pain, unspecified: Secondary | ICD-10-CM

## 2023-05-23 MED ORDER — PANTOPRAZOLE SODIUM 40 MG PO TBEC: 40.0000 mg | DELAYED_RELEASE_TABLET | Freq: Two times a day (BID) | ORAL | 3 refills | Status: DC

## 2023-05-23 NOTE — Telephone Encounter (Signed)
Rx sent 

## 2023-05-23 NOTE — Telephone Encounter (Signed)
Patient called and scheduled an appointment for January.  She said that she was out of the Protonix and needed to get it refilled at the [facility] in [place].

## 2023-05-24 DIAGNOSIS — J449 Chronic obstructive pulmonary disease, unspecified: Secondary | ICD-10-CM | POA: Diagnosis not present

## 2023-06-06 NOTE — Progress Notes (Deleted)
 Referring Provider: Del Orbe Polanco, Ilian* Primary Care Physician:  Terry Wilhelmena Lloyd Hilario, FNP Primary GI Physician: Dr. PIERRETTE Johns chief complaint on file.   HPI:   Jennifer Rollins is a 53 y.o. female with history of anxiety, arthritis, cervical cancer, COPD, depression, fibromyalgia, IBS, constipation, GERD, reflux esophagitis, PUD on EGD in April 2024, ulcer healed in August 2024, chronic elevated alkaline phosphatase with extensive work-up detailed below and now referred to [facility]***, presenting today for follow-up. ***  Last seen in the office 03/10/2023.  She had 2 episodes of rectal bleeding without a bowel movement in September with no recurrent symptoms.  Offered rectal exam, but patient declined.  Etiology unclear.  Patient reported having hemorrhoids though no reported hemorrhoids on her last colonoscopy.  Went ahead and prescribed Anusol  rectal cream for possible hemorrhoidal bleeding.  She also reported some fluctuation in her bowel habits.  Diarrhea related to Linzess  or lactose.  Recommended stopping Linzess  and using Colace 200 mg every morning.  Also with ongoing upper abdominal pain, primarily RUQ, early satiety.  Prior nausea resolved.  Suspected symptoms in part secondary to gastritis in setting of NSAIDs.  Recommended increasing pantoprazole  to twice a day and arranging gastric emptying study to rule out gastroparesis, update HFP and lipase.  Labs completed 03/10/23 with alk phos elevated at 191, otherwise within normal limits.  Gastric emptying study 03/13/2023 with 77% emptied at 4 hours (delayed).  Recommended following strict gastroparesis diet and taking PPI twice daily.  Office visit with [facility] 03/21/2023.  Recommended checking anti-SP 100 and anti-GP210 which were negative, MRI/MRCP (result unavailable). Per Stephane Quest, NP result note, MRI showed some bumpiness (nodularity) to the edge and recommended erring on the side of caution and  screening for liver cancer every 6 months. She has follow-up with Dawn on 09/19/23.   Today:  Constipation:   Gastroparesis:       EGD 12/30/2022 with normal esophagus, gastritis biopsied.  Pathology with reactive gastropathy, minimal chronic gastritis.  Negative for H. pylori.  Recommended continuing current medications, Protonix  40 mg daily.   Last colonoscopy in April 2024 with normal exam.   Past Medical History:  Diagnosis Date   Abnormal Pap smear of cervix    Allergy    Anemia    Anxiety    Arthritis    Benign essential hypertension 06/14/2020   Cancer (HCC) 2016   Cervical   Cataract 2022   Chronic back pain    Chronic pelvic pain in female    COPD (chronic obstructive pulmonary disease) (HCC)    Depression    Fibromyalgia    GERD (gastroesophageal reflux disease) 1990   HPV in female    Hypothyroidism 02/25/2020   IBS (irritable bowel syndrome)    NASH (nonalcoholic steatohepatitis)    PTSD (post-traumatic stress disorder)    Sciatica of right side    Sleep apnea 2022    Past Surgical History:  Procedure Laterality Date   ABDOMINAL HYSTERECTOMY N/A 09/14/2014   Procedure: HYSTERECTOMY ABDOMINAL;  Surgeon: Vonn VEAR Inch, MD;  Location: [facility];  Service: Gynecology;  Laterality: N/A;   BIOPSY  08/29/2022   Procedure: BIOPSY;  Surgeon: Cindie Carlin POUR, DO;  Location: [facility];  Service: Endoscopy;;   BIOPSY  12/30/2022   Procedure: BIOPSY;  Surgeon: Cindie Carlin POUR, DO;  Location: [facility];  Service: Endoscopy;;   CESAREAN SECTION     CHOLECYSTECTOMY     COLONOSCOPY WITH  PROPOFOL  N/A 08/29/2022   Surgeon: Cindie Carlin POUR, DO;  Normal exam.  Repeat in 10 years.   COLPOSCOPY VULVA W/ BIOPSY     ESOPHAGOGASTRODUODENOSCOPY (EGD) WITH PROPOFOL  N/A 08/29/2022   Surgeon: Cindie Carlin POUR, DO;   Severe reflux esophagitis biopsied, gastritis biopsied, nonbleeding gastric ulcer with no stigmata of bleeding, normal examined duodenum.  Gastric biopsies  benign, no H. pylori.  Esophageal biopsies benign.   ESOPHAGOGASTRODUODENOSCOPY (EGD) WITH PROPOFOL  N/A 12/30/2022   Procedure: ESOPHAGOGASTRODUODENOSCOPY (EGD) WITH PROPOFOL ;  Surgeon: Cindie Carlin POUR, DO;  Location: [facility];  Service: Endoscopy;  Laterality: N/A;  9:45 am, asa 3   EXCISIONAL HEMORRHOIDECTOMY     FRACTURE SURGERY  2022   Right forearm   right forearm surgery Right    SALPINGOOPHORECTOMY Bilateral 09/14/2014   Procedure: SALPINGO OOPHORECTOMY;  Surgeon: Vonn VEAR Inch, MD;  Location: [facility];  Service: Gynecology;  Laterality: Bilateral;   TUBAL LIGATION      Current Outpatient Medications  Medication Sig Dispense Refill   rosuvastatin  (CRESTOR ) 5 MG tablet Take 1 tablet (5 mg total) by mouth daily. 90 tablet 3   albuterol  (VENTOLIN  HFA) 108 (90 Base) MCG/ACT inhaler Inhale 2 puffs into the lungs every 6 (six) hours as needed for wheezing or shortness of breath. 8 g 3   Budeson-Glycopyrrol-Formoterol (BREZTRI  AEROSPHERE) 160-9-4.8 MCG/ACT AERO Inhale 2 puffs into the lungs 2 (two) times daily. (Patient taking differently: Inhale 2 puffs into the lungs 2 (two) times daily as needed (shortness breath or wheezing).) 10.7 g 11   Cholecalciferol (VITAMIN D3) 20 MCG (800 UNIT) TABS Take 1 tablet by mouth daily. 75 tablet 1   conjugated estrogens  (PREMARIN ) vaginal cream Place 1 Applicatorful vaginally daily. 30 g 11   Docusate Sodium  (DSS) 100 MG CAPS Take 100 mg by mouth daily as needed (constipation).     doxycycline  (VIBRAMYCIN ) 100 MG capsule Take 1 capsule (100 mg total) by mouth 2 (two) times daily. 14 capsule 0   DULoxetine  (CYMBALTA ) 30 MG capsule Take 2 capsules (60 mg total) by mouth daily. 180 capsule 3   folic acid  (FOLVITE ) 1 MG tablet Take 1 mg by mouth daily.     hydrocortisone  (ANUSOL -HC) 2.5 % rectal cream Place 1 Application rectally 2 (two) times daily. 30 g 1   levocetirizine (XYZAL ) 5 MG tablet Take 1 tablet (5 mg total) by mouth every evening. 30 tablet  3   linaclotide  (LINZESS ) 145 MCG CAPS capsule Take 1 capsule (145 mcg total) by mouth daily before breakfast. 30 capsule 5   methocarbamol  (ROBAXIN -750) 750 MG tablet Take 1 tablet (750 mg total) by mouth every 8 (eight) hours as needed for muscle spasms. 30 tablet 2   Omega-3 Fatty Acids (FISH OIL) 300 MG CAPS Take by mouth.     pantoprazole  (PROTONIX ) 40 MG tablet Take 1 tablet (40 mg total) by mouth 2 (two) times daily. 60 tablet 3   predniSONE  (DELTASONE ) 10 MG tablet Take 4 tablets (40 mg total) by mouth daily. 20 tablet 0   traZODone  (DESYREL ) 150 MG tablet Take 1-2 tablets (150-300 mg total) by mouth at bedtime. 90 tablet 3   No current facility-administered medications for this visit.    Allergies as of 06/09/2023 - Review Complete 04/05/2023  Allergen Reaction Noted   Dulcolax [bisacodyl ] Nausea And Vomiting 08/27/2022   Flagyl [metronidazole] Itching 01/15/2015   Latex Other (See Comments) 02/08/2013   Lithium Itching 08/01/2022    Family History  Problem Relation Age of  Onset   Diabetes Mother    Heart attack Mother    COPD Mother    Anxiety disorder Mother    Arthritis Mother    Cancer Mother    Depression Mother    Hearing loss Mother    Kidney disease Mother    Miscarriages / Stillbirths Mother    Alcohol abuse Father    ADD / ADHD Brother    Depression Brother    Drug abuse Brother    Cirrhosis Maternal Grandfather        ETOH   Colon cancer Maternal Grandfather    Learning disabilities Son     Social History   Socioeconomic History   Marital status: Married    Spouse name: Not on file   Number of children: Not on file   Years of education: Not on file   Highest education level: GED or equivalent  Occupational History   Not on file  Tobacco Use   Smoking status: Every Day    Current packs/day: 1.50    Average packs/day: 1.5 packs/day for 30.0 years (45.0 ttl pk-yrs)    Types: Cigarettes   Smokeless tobacco: Never  Substance and Sexual Activity    Alcohol use: Not Currently    Comment: Never a heavy drinker   Drug use: Never    Types: Marijuana    Comment: occassional use -last used months ago.   Sexual activity: Yes    Birth control/protection: Surgical  Other Topics Concern   Not on file  Social History Narrative   Not on file   Social Drivers of Health   Financial Resource Strain: Low Risk  (03/09/2023)   Overall Financial Resource Strain (CARDIA)    Difficulty of Paying Living Expenses: Not hard at all  Food Insecurity: Medium Risk (03/21/2023)   Received from [facility]   Hunger Vital Sign    Worried About Running Out of Food in the Last Year: Never true    Ran Out of Food in the Last Year: Sometimes true  Transportation Needs: No Transportation Needs (03/21/2023)   Received from [facility]    In the past 12 months, has lack of reliable transportation kept you from medical appointments, meetings, work or from getting things needed for daily living? : No  Physical Activity: Inactive (03/09/2023)   Exercise Vital Sign    Days of Exercise per Week: 0 days    Minutes of Exercise per Session: 0 min  Stress: Stress Concern Present (03/09/2023)   [employer] of Occupational Health - Occupational Stress Questionnaire    Feeling of Stress : Very much  Social Connections: Moderately Isolated (03/09/2023)   Social Connection and Isolation Panel [NHANES]    Frequency of Communication with Friends and Family: More than three times a week    Frequency of Social Gatherings with Friends and Family: Once a week    Attends Religious Services: Never    Database Administrator or Organizations: No    Attends Engineer, Structural: Never    Marital Status: Married    Review of Systems: Gen: Denies fever, chills, anorexia. Denies fatigue, weakness, weight loss.  CV: Denies chest pain, palpitations, syncope, peripheral edema, and claudication. Resp: Denies dyspnea at rest, cough, wheezing,  coughing up blood, and pleurisy. GI: Denies vomiting blood, jaundice, and fecal incontinence.   Denies dysphagia or odynophagia. Derm: Denies rash, itching, dry skin Psych: Denies depression, anxiety, memory loss, confusion. No homicidal or suicidal ideation.  Heme: Denies  bruising, bleeding, and enlarged lymph nodes.  Physical Exam: LMP 08/19/2014  General:   Alert and oriented. No distress noted. Pleasant and cooperative.  Head:  Normocephalic and atraumatic. Eyes:  Conjuctiva clear without scleral icterus. Heart:  S1, S2 present without murmurs appreciated. Lungs:  Clear to auscultation bilaterally. No wheezes, rales, or rhonchi. No distress.  Abdomen:  +BS, soft, non-tender and non-distended. No rebound or guarding. No HSM or masses noted. Msk:  Symmetrical without gross deformities. Normal posture. Extremities:  Without edema. Neurologic:  Alert and  oriented x4 Psych:  Normal mood and affect.    Assessment:     Plan:  ***   Josette Centers, PA-C [facility] 06/09/2023

## 2023-06-09 ENCOUNTER — Encounter: Payer: Self-pay | Admitting: Gastroenterology

## 2023-06-09 ENCOUNTER — Ambulatory Visit: Payer: 59 | Admitting: Gastroenterology

## 2023-06-10 NOTE — Progress Notes (Deleted)
Office Note     CC: Asymptomatic varicose veins Requesting Provider:  Wylene Men*  HPI: Jennifer Rollins is a 53 y.o. ([birth year]) female who presents at the request of Del Nigel Berthold, FNP for evaluation of ***.   Venous symptoms include: positive if (X) [  ] aching [  ] heavy [  ] tired  [  ] throbbing [  ] burning  [  ] itching [  ]swelling [  ] bleeding [  ] ulcer  Onset/duration:  ***  Occupation:  *** Aggravating factors: (sitting, standing) Alleviating factors: (elevation) Compression:  *** Helps:  *** Pain medications:  *** Previous vein procedures:  *** History of DVT:  ***   The pt *** on a statin for cholesterol management.  The pt *** on a daily aspirin.   Other AC:  *** The pt *** on *** for hypertension.   The pt *** diabetic.  *** Tobacco hx:  ***  Past Medical History:  Diagnosis Date   Abnormal Pap smear of cervix    Allergy    Anemia    Anxiety    Arthritis    Benign essential hypertension 06/14/2020   Cancer (HCC) 2016   Cervical   Cataract 2022   Chronic back pain    Chronic pelvic pain in female    COPD (chronic obstructive pulmonary disease) (HCC)    Depression    Fibromyalgia    GERD (gastroesophageal reflux disease) 1990   HPV in female    Hypothyroidism 02/25/2020   IBS (irritable bowel syndrome)    NASH (nonalcoholic steatohepatitis)    PTSD (post-traumatic stress disorder)    Sciatica of right side    Sleep apnea 2022    Past Surgical History:  Procedure Laterality Date   ABDOMINAL HYSTERECTOMY N/A 09/14/2014   Procedure: HYSTERECTOMY ABDOMINAL;  Surgeon: Lazaro Arms, MD;  Location: [facility];  Service: Gynecology;  Laterality: N/A;   BIOPSY  08/29/2022   Procedure: BIOPSY;  Surgeon: Lanelle Bal, DO;  [facility];  Service: Endoscopy;;   BIOPSY  12/30/2022   Procedure: BIOPSY;  Surgeon: Lanelle Bal, DO;  [facility];  Service: Endoscopy;;   CESAREAN SECTION      CHOLECYSTECTOMY     COLONOSCOPY WITH PROPOFOL N/A 08/29/2022   Surgeon: Lanelle Bal, DO;  Normal exam.  Repeat in 10 years.   COLPOSCOPY VULVA W/ BIOPSY     ESOPHAGOGASTRODUODENOSCOPY (EGD) WITH PROPOFOL N/A 08/29/2022   Surgeon: Lanelle Bal, DO;   Severe reflux esophagitis biopsied, gastritis biopsied, nonbleeding gastric ulcer with no stigmata of bleeding, normal examined duodenum.  Gastric biopsies benign, no H. pylori.  Esophageal biopsies benign.   ESOPHAGOGASTRODUODENOSCOPY (EGD) WITH PROPOFOL N/A 12/30/2022   Procedure: ESOPHAGOGASTRODUODENOSCOPY (EGD) WITH PROPOFOL;  Surgeon: Lanelle Bal, DO;  [facility]: [facility];  Service: Endoscopy;  Laterality: N/A;  9:45 am, asa 3   EXCISIONAL HEMORRHOIDECTOMY     FRACTURE SURGERY  2022   Right forearm   right forearm surgery Right    SALPINGOOPHORECTOMY Bilateral 09/14/2014   Procedure: SALPINGO OOPHORECTOMY;  Surgeon: Lazaro Arms, MD;  Location: [facility];  Service: Gynecology;  Laterality: Bilateral;   TUBAL LIGATION      Social History   Socioeconomic History   Marital status: Married    Spouse name: Not on file   Number of children: Not on file   Years of education: Not on file   Highest education level:  GED or equivalent  Occupational History   Not on file  Tobacco Use   Smoking status: Every Day    Current packs/day: 1.50    Average packs/day: 1.5 packs/day for 30.0 years (45.0 ttl pk-yrs)    Types: Cigarettes   Smokeless tobacco: Never  Substance and Sexual Activity   Alcohol use: Not Currently    Comment: Never a heavy drinker   Drug use: Never    Types: Marijuana    Comment: occassional use -last used "months ago".   Sexual activity: Yes    Birth control/protection: Surgical  Other Topics Concern   Not on file  Social History Narrative   Not on file   Social Drivers of Health   Financial Resource Strain: Low Risk  (03/09/2023)   Overall Financial Resource Strain (CARDIA)    Difficulty of  Paying Living Expenses: Not hard at all  Food Insecurity: Medium Risk (03/21/2023)   Received from [facility]   Hunger Vital Sign    Worried About Running Out of Food in the Last Year: Never true    Ran Out of Food in the Last Year: Sometimes true  Transportation Needs: No Transportation Needs (03/21/2023)   Received from [facility]    In the past 12 months, has lack of reliable transportation kept you from medical appointments, meetings, work or from getting things needed for daily living? : No  Physical Activity: Inactive (03/09/2023)   Exercise Vital Sign    Days of Exercise per Week: 0 days    Minutes of Exercise per Session: 0 min  Stress: Stress Concern Present (03/09/2023)   [employer] of Occupational Health - Occupational Stress Questionnaire    Feeling of Stress : Very much  Social Connections: Moderately Isolated (03/09/2023)   Social Connection and Isolation Panel [NHANES]    Frequency of Communication with Friends and Family: More than three times a week    Frequency of Social Gatherings with Friends and Family: Once a week    Attends Religious Services: Never    Database administrator or Organizations: No    Attends Banker Meetings: Never    Marital Status: Married  Catering manager Violence: Not At Risk (01/21/2023)   Humiliation, Afraid, Rape, and Kick questionnaire    Fear of Current or Ex-Partner: No    Emotionally Abused: No    Physically Abused: No    Sexually Abused: No   *** Family History  Problem Relation Age of Onset   Diabetes Mother    Heart attack Mother    COPD Mother    Anxiety disorder Mother    Arthritis Mother    Cancer Mother    Depression Mother    Hearing loss Mother    Kidney disease Mother    Miscarriages / [place] Mother    Alcohol abuse Father    ADD / ADHD Brother    Depression Brother    Drug abuse Brother    Cirrhosis Maternal Grandfather        ETOH   Colon cancer Maternal  Grandfather    Learning disabilities Son     Current Outpatient Medications  Medication Sig Dispense Refill   rosuvastatin (CRESTOR) 5 MG tablet Take 1 tablet (5 mg total) by mouth daily. 90 tablet 3   albuterol (VENTOLIN HFA) 108 (90 Base) MCG/ACT inhaler Inhale 2 puffs into the lungs every 6 (six) hours as needed for wheezing or shortness of breath. 8 g 3   Budeson-Glycopyrrol-Formoterol (  BREZTRI AEROSPHERE) 160-9-4.8 MCG/ACT AERO Inhale 2 puffs into the lungs 2 (two) times daily. (Patient taking differently: Inhale 2 puffs into the lungs 2 (two) times daily as needed (shortness breath or wheezing).) 10.7 g 11   Cholecalciferol (VITAMIN D3) 20 MCG (800 UNIT) TABS Take 1 tablet by mouth daily. 75 tablet 1   conjugated estrogens (PREMARIN) vaginal cream Place 1 Applicatorful vaginally daily. 30 g 11   Docusate Sodium (DSS) 100 MG CAPS Take 100 mg by mouth daily as needed (constipation).     doxycycline (VIBRAMYCIN) 100 MG capsule Take 1 capsule (100 mg total) by mouth 2 (two) times daily. 14 capsule 0   DULoxetine (CYMBALTA) 30 MG capsule Take 2 capsules (60 mg total) by mouth daily. 180 capsule 3   folic acid (FOLVITE) 1 MG tablet Take 1 mg by mouth daily.     hydrocortisone (ANUSOL-HC) 2.5 % rectal cream Place 1 Application rectally 2 (two) times daily. 30 g 1   levocetirizine (XYZAL) 5 MG tablet Take 1 tablet (5 mg total) by mouth every evening. 30 tablet 3   linaclotide (LINZESS) 145 MCG CAPS capsule Take 1 capsule (145 mcg total) by mouth daily before breakfast. 30 capsule 5   methocarbamol (ROBAXIN-750) 750 MG tablet Take 1 tablet (750 mg total) by mouth every 8 (eight) hours as needed for muscle spasms. 30 tablet 2   Omega-3 Fatty Acids (FISH OIL) 300 MG CAPS Take by mouth.     pantoprazole (PROTONIX) 40 MG tablet Take 1 tablet (40 mg total) by mouth 2 (two) times daily. 60 tablet 3   predniSONE (DELTASONE) 10 MG tablet Take 4 tablets (40 mg total) by mouth daily. 20 tablet 0   traZODone  (DESYREL) 150 MG tablet Take 1-2 tablets (150-300 mg total) by mouth at bedtime. 90 tablet 3   No current facility-administered medications for this visit.    Allergies  Allergen Reactions   Dulcolax [Bisacodyl] Nausea And Vomiting   Flagyl [Metronidazole] Itching   Latex Other (See Comments)   Lithium Itching     REVIEW OF SYSTEMS:  *** [X]  denotes positive finding, [ ]  denotes negative finding Cardiac  Comments:  Chest pain or chest pressure:    Shortness of breath upon exertion:    Short of breath when lying flat:    Irregular heart rhythm:        Vascular    Pain in calf, thigh, or hip brought on by ambulation:    Pain in feet at night that wakes you up from your sleep:     Blood clot in your veins:    Leg swelling:         Pulmonary    Oxygen at home:    Productive cough:     Wheezing:         Neurologic    Sudden weakness in arms or legs:     Sudden numbness in arms or legs:     Sudden onset of difficulty speaking or slurred speech:    Temporary loss of vision in one eye:     Problems with dizziness:         Gastrointestinal    Blood in stool:     Vomited blood:         Genitourinary    Burning when urinating:     Blood in urine:        Psychiatric    Major depression:         Hematologic  Bleeding problems:    Problems with blood clotting too easily:        Skin    Rashes or ulcers:        Constitutional    Fever or chills:      PHYSICAL EXAMINATION:  There were no vitals filed for this visit.  General:  WDWN in NAD; vital signs documented above Gait: Not observed HENT: WNL, normocephalic Pulmonary: normal non-labored breathing , without Rales, rhonchi,  wheezing Cardiac: {Desc; regular/irreg:14544} HR, without  Murmurs {With/Without:20273} carotid bruit*** Abdomen: soft, NT, no masses Skin: {With/Without:20273} rashes Vascular Exam/Pulses:  Right Left  Radial {Exam; arterial pulse strength 0-4:30167} {Exam; arterial pulse strength  0-4:30167}  Ulnar {Exam; arterial pulse strength 0-4:30167} {Exam; arterial pulse strength 0-4:30167}  Femoral {Exam; arterial pulse strength 0-4:30167} {Exam; arterial pulse strength 0-4:30167}  Popliteal {Exam; arterial pulse strength 0-4:30167} {Exam; arterial pulse strength 0-4:30167}  DP {Exam; arterial pulse strength 0-4:30167} {Exam; arterial pulse strength 0-4:30167}  PT {Exam; arterial pulse strength 0-4:30167} {Exam; arterial pulse strength 0-4:30167}   Extremities: {With/Without:20273} ischemic changes, {With/Without:20273} Gangrene , {With/Without:20273} cellulitis; {With/Without:20273} open wounds;  Musculoskeletal: no muscle wasting or atrophy  Neurologic: A&O X 3;  No focal weakness or paresthesias are detected Psychiatric:  The pt has {Desc; normal/abnormal:11317::"Normal"} affect.   Non-Invasive Vascular Imaging:   ***    ASSESSMENT/PLAN:: 53 y.o. female presenting with ***   ***   Victorino Sparrow, MD [facility] [phone]

## 2023-06-12 ENCOUNTER — Encounter: Payer: 59 | Admitting: Vascular Surgery

## 2023-06-12 ENCOUNTER — Encounter (HOSPITAL_COMMUNITY): Payer: Self-pay

## 2023-06-12 ENCOUNTER — Ambulatory Visit (HOSPITAL_COMMUNITY): Admission: RE | Admit: 2023-06-12 | Payer: 59 | Source: Ambulatory Visit

## 2023-06-24 DIAGNOSIS — J449 Chronic obstructive pulmonary disease, unspecified: Secondary | ICD-10-CM | POA: Diagnosis not present

## 2023-06-25 NOTE — Progress Notes (Unsigned)
Referring Provider: Wylene Men* Primary Care Physician:  Rica Records, FNP Primary GI Physician: Dr. Marletta Lor  Chief Complaint  Patient presents with   Bloated    Stomach is bloated     HPI:   Jennifer Rollins is a 53 y.o. female with history of anxiety, arthritis, cervical cancer, COPD, depression, fibromyalgia, IBS, constipation, GERD, reflux esophagitis, PUD, gastroparesis, chronic elevated alkaline phosphatase with extensive work-up detailed below and now referred to [facility], presenting today with chief complaint of abdominal distension.    Last seen in the office 03/10/2023.  She had 2 episodes of rectal bleeding without a bowel movement in September with no recurrent symptoms.  Offered rectal exam, but patient declined.  Etiology unclear.  Patient reported having hemorrhoids though no reported hemorrhoids on her last colonoscopy.  Went ahead and prescribed Anusol rectal cream for possible hemorrhoidal bleeding.  She also reported some fluctuation in her bowel habits.  Diarrhea related to Linzess or lactose.  Recommended stopping Linzess and using Colace 200 mg every morning.  Also with ongoing upper abdominal pain, primarily RUQ, early satiety.  Prior nausea resolved.  Suspected symptoms in part secondary to gastritis in setting of NSAIDs.  Recommended increasing pantoprazole to twice a day and arranging gastric emptying study to rule out gastroparesis, update HFP and lipase.   Labs completed 03/10/23 with alk phos elevated at 191, otherwise within normal limits.   Gastric emptying study 03/13/2023 with 77% emptied at 4 hours (delayed).  Recommended following strict gastroparesis diet and taking PPI twice daily.   Office visit with [facility] 03/21/2023.  Recommended checking anti-SP 100 and anti-GP210 which were negative, MRI/MRCP with suggestion of cirrhosis, multiple prominent porta hepatis and portacaval lymph nodes, likely reactive, no  biliary duct dilation. Per Annamarie Major, NP recommended erring on the side of caution and screening for liver cancer every 6 months. She has follow-up with [facility] on 09/19/23.      Today:  Abdominal distension x 2 weeks. Sides are hurting because of this. Hard to take deep breaths. Not affected by eating. Never resolves. Gets worse throughout the day. Gaining weight. She is gassy, but this is chronic. No change with passing gas.   Some swelling in her legs as well which started a couple of weeks ago. Noticed a ring when she takes her socks off. Goes down over night.   Eating potatoes, pork chops, hamburger, chicken, pimento sandwiches. No canned foods, frozen foods, deli meats. Frozen vegetables, no sauce added. Seasons with season salt, garlic powder, sometimes uses salt shaker. Drinks soda.   No NSAIDs since I saw her last aside from 2 days last week.  No ETOH   Constipation:  Doing well. Bms every other day. Productive. Not taking anything. Every now and then will take a suppository. No brbpr or melena.    Gastroparesis/upper abdominal pain:  No nausea unless she goes too long without eating.  No heartburn.  Eats 1 meal a day because she feels so full/bloated realted to abdominal distension as per above.  Taking pantoprazole BID.     Last GI procedures:   EGD 12/30/2022 with normal esophagus, gastritis biopsied.  Pathology with reactive gastropathy, minimal chronic gastritis.  Negative for H. pylori.  Recommended continuing current medications, Protonix 40 mg daily.    Last colonoscopy in April 2024 with normal exam.   Prior workup of elevated alkaline phosphatase:         Liver biopsy 04/18/2021: Mild  to moderate portal chronic inflammation with interface hepatitis (grade 2 of 4), no significant lobular inflammation, borderline mild portal fibrosis without cirrhosis (stage I of 4), negative for steatosis.  Comment states differential includes nonhepatic tropic viral infection, drug or  toxin induced liver injury, exposure to chemicals or solvents.   MRI abdomen with and without contrast 08/17/2021: Mildly enlarged liver, portions of the liver capsule slightly nodular, cannot exclude early changes of cirrhosis.  Normal spleen size.  No ascites.  No adenopathy.  No suspicious enhancing liver mass.  Variant cecal anatomy (located in the midline)   Labs 08/12/2022: Hepatitis A, B, and C labs were negative.  Recommended vaccination for hepatitis A and B. ANA, AMA, ASMA, IgG, IgM all within normal limits.  Mild elevation of IgA at 555. Alk phos 244 with liver fraction 70%, bone fraction 30%.     Abdominal ultrasound with elastography is 08/26/2022: Increased hepatic echogenicity, no definite nodularity, normal spleen, median K PA 4.3.   Past Medical History:  Diagnosis Date   Abnormal Pap smear of cervix    Allergy    Anemia    Anxiety    Arthritis    Benign essential hypertension 06/14/2020   Cancer (HCC) 2016   Cervical   Cataract 2022   Chronic back pain    Chronic pelvic pain in female    COPD (chronic obstructive pulmonary disease) (HCC)    Depression    Fibromyalgia    GERD (gastroesophageal reflux disease) 1990   HPV in female    Hypothyroidism 02/25/2020   IBS (irritable bowel syndrome)    NASH (nonalcoholic steatohepatitis)    PTSD (post-traumatic stress disorder)    Sciatica of right side    Sleep apnea 2022    Past Surgical History:  Procedure Laterality Date   ABDOMINAL HYSTERECTOMY N/A 09/14/2014   Procedure: HYSTERECTOMY ABDOMINAL;  Surgeon: Lazaro Arms, MD;  Location: [facility];  Service: Gynecology;  Laterality: N/A;   BIOPSY  08/29/2022   Procedure: BIOPSY;  Surgeon: Lanelle Bal, DO;  Location: [facility];  Service: Endoscopy;;   BIOPSY  12/30/2022   Procedure: BIOPSY;  Surgeon: Lanelle Bal, DO;  Location: [facility];  Service: Endoscopy;;   CESAREAN SECTION     CHOLECYSTECTOMY     COLONOSCOPY WITH PROPOFOL N/A 08/29/2022    Surgeon: Lanelle Bal, DO;  Normal exam.  Repeat in 10 years.   COLPOSCOPY VULVA W/ BIOPSY     ESOPHAGOGASTRODUODENOSCOPY (EGD) WITH PROPOFOL N/A 08/29/2022   Surgeon: Lanelle Bal, DO;   Severe reflux esophagitis biopsied, gastritis biopsied, nonbleeding gastric ulcer with no stigmata of bleeding, normal examined duodenum.  Gastric biopsies benign, no H. pylori.  Esophageal biopsies benign.   ESOPHAGOGASTRODUODENOSCOPY (EGD) WITH PROPOFOL N/A 12/30/2022   Procedure: ESOPHAGOGASTRODUODENOSCOPY (EGD) WITH PROPOFOL;  Surgeon: Lanelle Bal, DO;  Location: [facility];  Service: Endoscopy;  Laterality: N/A;  9:45 am, asa 3   EXCISIONAL HEMORRHOIDECTOMY     FRACTURE SURGERY  2022   Right forearm   right forearm surgery Right    SALPINGOOPHORECTOMY Bilateral 09/14/2014   Procedure: SALPINGO OOPHORECTOMY;  Surgeon: Lazaro Arms, MD;  Location: [facility];  Service: Gynecology;  Laterality: Bilateral;   TUBAL LIGATION      Current Outpatient Medications  Medication Sig Dispense Refill   albuterol (VENTOLIN HFA) 108 (90 Base) MCG/ACT inhaler Inhale 2 puffs into the lungs every 6 (six) hours as needed for wheezing or shortness of breath. 8 g  3   Budeson-Glycopyrrol-Formoterol (BREZTRI AEROSPHERE) 160-9-4.8 MCG/ACT AERO Inhale 2 puffs into the lungs 2 (two) times daily. (Patient taking differently: Inhale 2 puffs into the lungs 2 (two) times daily as needed (shortness breath or wheezing).) 10.7 g 11   Cholecalciferol (VITAMIN D3) 20 MCG (800 UNIT) TABS Take 1 tablet by mouth daily. 75 tablet 1   DULoxetine (CYMBALTA) 30 MG capsule Take 2 capsules (60 mg total) by mouth daily. 180 capsule 3   pantoprazole (PROTONIX) 40 MG tablet Take 1 tablet (40 mg total) by mouth 2 (two) times daily. 60 tablet 3   rosuvastatin (CRESTOR) 5 MG tablet Take 1 tablet (5 mg total) by mouth daily. 90 tablet 3   traZODone (DESYREL) 150 MG tablet Take 1-2 tablets (150-300 mg total) by mouth at bedtime. 90 tablet 3    hydrocortisone (ANUSOL-HC) 2.5 % rectal cream Place 1 Application rectally 2 (two) times daily. (Patient not taking: Reported on 06/26/2023) 30 g 1   methocarbamol (ROBAXIN-750) 750 MG tablet Take 1 tablet (750 mg total) by mouth every 8 (eight) hours as needed for muscle spasms. (Patient not taking: Reported on 06/26/2023) 30 tablet 2   No current facility-administered medications for this visit.    Allergies as of 06/26/2023 - Review Complete 06/26/2023  Allergen Reaction Noted   Dulcolax [bisacodyl] Nausea And Vomiting 08/27/2022   Flagyl [metronidazole] Itching 01/15/2015   Latex Other (See Comments) 02/08/2013   Lithium Itching 08/01/2022    Family History  Problem Relation Age of Onset   Diabetes Mother    Heart attack Mother    COPD Mother    Anxiety disorder Mother    Arthritis Mother    Cancer Mother    Depression Mother    Hearing loss Mother    Kidney disease Mother    Miscarriages / [place] Mother    Alcohol abuse Father    ADD / ADHD Brother    Depression Brother    Drug abuse Brother    Cirrhosis Maternal Grandfather        ETOH   Colon cancer Maternal Grandfather    Learning disabilities Son     Social History   Socioeconomic History   Marital status: Married    Spouse name: Not on file   Number of children: Not on file   Years of education: Not on file   Highest education level: GED or equivalent  Occupational History   Not on file  Tobacco Use   Smoking status: Every Day    Current packs/day: 1.50    Average packs/day: 1.5 packs/day for 30.0 years (45.0 ttl pk-yrs)    Types: Cigarettes   Smokeless tobacco: Never  Substance and Sexual Activity   Alcohol use: Not Currently    Comment: Never a heavy drinker   Drug use: Not Currently    Types: Marijuana    Comment: occassional use -last used "months ago".   Sexual activity: Yes    Birth control/protection: Surgical  Other Topics Concern   Not on file  Social History Narrative   Not on  file   Social Drivers of Health   Financial Resource Strain: Low Risk  (03/09/2023)   Overall Financial Resource Strain (CARDIA)    Difficulty of Paying Living Expenses: Not hard at all  Food Insecurity: Medium Risk (03/21/2023)   Received from [facility]   Hunger Vital Sign    Worried About Running Out of Food in the Last Year: Never true    Ran Out of  Food in the Last Year: Sometimes true  Transportation Needs: No Transportation Needs (03/21/2023)   Received from [facility]    In the past 12 months, has lack of reliable transportation kept you from medical appointments, meetings, work or from getting things needed for daily living? : No  Physical Activity: Inactive (03/09/2023)   Exercise Vital Sign    Days of Exercise per Week: 0 days    Minutes of Exercise per Session: 0 min  Stress: Stress Concern Present (03/09/2023)   [employer] of Occupational Health - Occupational Stress Questionnaire    Feeling of Stress : Very much  Social Connections: Moderately Isolated (03/09/2023)   Social Connection and Isolation Panel [NHANES]    Frequency of Communication with Friends and Family: More than three times a week    Frequency of Social Gatherings with Friends and Family: Once a week    Attends Religious Services: Never    Database administrator or Organizations: No    Attends Engineer, structural: Never    Marital Status: Married    Review of Systems: Gen: Denies fever, chills, cold or flulike symptoms, presyncope, syncope. CV: Denies chest pain, palpitations. Resp: Denies dyspnea at rest, cough. GI: See HPI Heme: See HPI  Physical Exam: BP 136/80 (BP Location: Right Arm, Patient Position: Sitting, Cuff Size: Normal)   Pulse 84   Temp 97.6 F (36.4 C) (Temporal)   Ht 5\' 5"  (1.651 m)   Wt 192 lb 9.6 oz (87.4 kg)   LMP 08/19/2014   BMI 32.05 kg/m  General:   Alert and oriented. No distress noted. Pleasant and cooperative.  Head:   Normocephalic and atraumatic. Eyes:  Conjuctiva clear without scleral icterus. Heart:  S1, S2 present without murmurs appreciated. Lungs:  Clear to auscultation bilaterally. No wheezes, rales, or rhonchi. No distress.  Abdomen:  +BS, full, soft. Epigastric and LUQ TTP. No rebound or guarding. No HSM or masses noted. Msk:  Symmetrical without gross deformities. Normal posture. Extremities:  Without edema. Neurologic:  Alert and  oriented x4 Psych:  Normal mood and affect.    Assessment:  53 year old female with history of anxiety, arthritis, cervical cancer, COPD, depression, fibromyalgia, IBS, constipation, GERD, reflux esophagitis, PUD, gastroparesis, chronic elevated alkaline phosphatase, fatty liver and more recently found to have possible cirrhosis on MRI, presenting today with chief complaint of abdominal distention.  Abdominal distention: Patient reports increasing abdominal distention x 2 weeks.  Associated abdominal pain, flank pain, difficulty taking deep breaths.  On exam, her abdomen is full, but is nontense.  She does have tenderness to palpation in the epigastric and LUQ region.  Etiology unclear.  Considering recent MRI showing concerns for cirrhosis, unable to rule out development of ascites and will obtain abdominal ultrasound to rule this out. Also updating labs. I do not she has had increasing creatinine over the last year. Unclear if this is playing a role in possible fluid retention. She does report having a lot of gas which certainly could be contributing though this is a chronic symptom for her.  She denies postprandial bloating which makes me think this is less likely related to gastroparesis or biliary etiology. She has history of cholecystectomy and normal biliary tree on MRI in November.  Upper abdominal pain: Chronic. May be multifactorial in the setting of gastritis, gastroparesis, and now abdominal distention as discussed above which she does feel is worsening her  symptoms.  She has discontinued NSAIDs and is maintaining on PPI  twice daily.  Last EGD in August 2024 with gastritis, biopsies negative for H. pylori.  Recent MRI/MRCP in November with normal biliary tree, possible cirrhosis.  No pancreatic abnormalities.  At this point, I will update an abdominal ultrasound as well as labs.  Further recommendations to follow.  Early cirrhosis/chronic alk phos elevation: Chronic elevated alk phos with extensive workup in the past including liver biopsy in 2022 with no definitive diagnosis.  She was referred to [facility] for further evaluation.  Additional laboratory workup including anti-SP 100 and anti-GP210, both of which were negative.  MRI/MRCP suggestive of cirrhosis, evidence of multiple prominent porta hepatis and portacaval lymph nodes, likely reactive, but no biliary duct dilation. Annamarie Major, NP recommended erring on the side of caution and screening for liver cancer every 6 months. She has follow-up with [facility] on 09/19/23. Notably, on most recent labs in November, alk phos had normalized.   MELD 3.0 is 10 on October/November 2024.   Clinically, she has been well compensated to this point.  However, she is reporting abdominal distention and lower extremity edema over the last 2 weeks.  On exam, her abdomen is full, but soft.  No appreciable LE edema.  Planning to update abdominal ultrasound to evaluate for ascites.   Gastroparesis: Denies nausea, but continues with early satiety and having increasing abdominal bloating as discussed above. No weight loss.  As bloating isn't postprandial, this is less likely to be secondary to gastroparesis. Currently of PPI BID. Has never required reglan. We discussed a gastroparesis diet/lifestyle today.      Plan:  CBC, CMP, lipase Abdominal ultrasound complete ASAP Continue pantoprazole 40 mg twice daily Gastroparesis recommendations:  4-6 small meals daily Low fat diet Low fiber diet (avoid raw fruits and  vegetables). Cirrhosis diet/lifestyle recommendations 1-2# weight loss per week until ideal body weight through exercise & diet. Low fat/cholesterol diet.   Avoid sweets, sodas, fruit juices, sweetened beverages like tea, etc. Gradually increase exercise from 15 min daily up to 1 hr per day 5 days/week. Avoid alcohol use. 2g/day sodium diet restriction.  Complete hepatitis A/B vaccine series. Further recommendations to follow labs and imaging.    Ermalinda Memos, PA-C [facility] 06/26/2023

## 2023-06-26 ENCOUNTER — Ambulatory Visit (HOSPITAL_COMMUNITY)
Admission: RE | Admit: 2023-06-26 | Discharge: 2023-06-26 | Disposition: A | Discharge status: Home / Self Care | Payer: 59 | Source: Ambulatory Visit | Attending: Gastroenterology | Admitting: Gastroenterology

## 2023-06-26 ENCOUNTER — Ambulatory Visit (INDEPENDENT_AMBULATORY_CARE_PROVIDER_SITE_OTHER): Payer: 59 | Admitting: Gastroenterology

## 2023-06-26 ENCOUNTER — Encounter: Payer: Self-pay | Admitting: Gastroenterology

## 2023-06-26 VITALS — BP 136/80 | HR 84 | Temp 97.6°F | Ht 65.0 in | Wt 192.6 lb

## 2023-06-26 DIAGNOSIS — Z8719 Personal history of other diseases of the digestive system: Secondary | ICD-10-CM | POA: Diagnosis not present

## 2023-06-26 DIAGNOSIS — R140 Abdominal distension (gaseous): Secondary | ICD-10-CM

## 2023-06-26 DIAGNOSIS — K3184 Gastroparesis: Secondary | ICD-10-CM

## 2023-06-26 DIAGNOSIS — R1013 Epigastric pain: Secondary | ICD-10-CM

## 2023-06-26 DIAGNOSIS — K7689 Other specified diseases of liver: Secondary | ICD-10-CM | POA: Diagnosis not present

## 2023-06-26 DIAGNOSIS — R1012 Left upper quadrant pain: Secondary | ICD-10-CM | POA: Diagnosis not present

## 2023-06-26 DIAGNOSIS — R1010 Upper abdominal pain, unspecified: Secondary | ICD-10-CM

## 2023-06-26 DIAGNOSIS — K7460 Unspecified cirrhosis of liver: Secondary | ICD-10-CM

## 2023-06-26 DIAGNOSIS — Z9049 Acquired absence of other specified parts of digestive tract: Secondary | ICD-10-CM | POA: Diagnosis not present

## 2023-06-26 DIAGNOSIS — N281 Cyst of kidney, acquired: Secondary | ICD-10-CM | POA: Diagnosis not present

## 2023-06-26 NOTE — Patient Instructions (Addendum)
Please have labs completed at [facility].  We will get scheduled for an ultrasound of your abdomen.  Please complete hepatitis A/B vaccine series.  Continue pantoprazole 40 mg twice daily 30 minutes before breakfast and dinner.  Gastroparesis recommendations:  4-6 small meals daily Low fat diet Low fiber diet (avoid raw fruits and vegetables).  Instructions for fatty liver: Recommend 1-2# weight loss per week until ideal body weight through exercise & diet. Low fat/cholesterol diet.   Avoid sweets, sodas, fruit juices, sweetened beverages like tea, etc. Gradually increase exercise from 15 min daily up to 1 hr per day 5 days/week. Avoid alcohol use.  We will call you with results and further recommendations.   Ermalinda Memos, PA-C [facility]

## 2023-06-27 LAB — CBC WITH DIFFERENTIAL/PLATELET
Basophils Absolute: 0.1 10*3/uL (ref 0.0–0.2)
Basos: 1 %
EOS (ABSOLUTE): 0.1 10*3/uL (ref 0.0–0.4)
Eos: 2 %
Hematocrit: 42.7 % (ref 34.0–46.6)
Hemoglobin: 13.6 g/dL (ref 11.1–15.9)
Immature Grans (Abs): 0 10*3/uL (ref 0.0–0.1)
Immature Granulocytes: 0 %
Lymphocytes Absolute: 1.7 10*3/uL (ref 0.7–3.1)
Lymphs: 23 %
MCH: 28.3 pg (ref 26.6–33.0)
MCHC: 31.9 g/dL (ref 31.5–35.7)
MCV: 89 fL (ref 79–97)
Monocytes Absolute: 0.8 10*3/uL (ref 0.1–0.9)
Monocytes: 10 %
Neutrophils Absolute: 5 10*3/uL (ref 1.4–7.0)
Neutrophils: 64 %
Platelets: 363 10*3/uL (ref 150–450)
RBC: 4.8 x10E6/uL (ref 3.77–5.28)
RDW: 12.8 % (ref 11.7–15.4)
WBC: 7.6 10*3/uL (ref 3.4–10.8)

## 2023-06-27 LAB — CMP14+EGFR
ALT: 23 [IU]/L (ref 0–32)
AST: 28 [IU]/L (ref 0–40)
Albumin: 4.3 g/dL (ref 3.8–4.9)
Alkaline Phosphatase: 259 [IU]/L — ABNORMAL HIGH (ref 44–121)
BUN/Creatinine Ratio: 11 (ref 9–23)
BUN: 12 mg/dL (ref 6–24)
Bilirubin Total: 0.5 mg/dL (ref 0.0–1.2)
CO2: 23 mmol/L (ref 20–29)
Calcium: 9.6 mg/dL (ref 8.7–10.2)
Chloride: 102 mmol/L (ref 96–106)
Creatinine, Ser: 1.05 mg/dL — ABNORMAL HIGH (ref 0.57–1.00)
Globulin, Total: 2.8 g/dL (ref 1.5–4.5)
Glucose: 100 mg/dL — ABNORMAL HIGH (ref 70–99)
Potassium: 4.8 mmol/L (ref 3.5–5.2)
Sodium: 143 mmol/L (ref 134–144)
Total Protein: 7.1 g/dL (ref 6.0–8.5)
eGFR: 63 mL/min/{1.73_m2} (ref >59)

## 2023-06-27 LAB — LIPASE: Lipase: 29 U/L (ref 14–72)

## 2023-07-17 ENCOUNTER — Encounter: Payer: Self-pay | Admitting: Gastroenterology

## 2023-07-17 ENCOUNTER — Other Ambulatory Visit: Payer: Self-pay | Admitting: Gastroenterology

## 2023-07-17 DIAGNOSIS — K3184 Gastroparesis: Secondary | ICD-10-CM

## 2023-07-17 MED ORDER — METOCLOPRAMIDE HCL 5 MG PO TABS: 5.0000 mg | ORAL_TABLET | Freq: Three times a day (TID) | ORAL | 1 refills | Status: DC

## 2023-07-25 DIAGNOSIS — J449 Chronic obstructive pulmonary disease, unspecified: Secondary | ICD-10-CM | POA: Diagnosis not present

## 2023-08-05 ENCOUNTER — Ambulatory Visit (HOSPITAL_COMMUNITY)
Admission: RE | Admit: 2023-08-05 | Discharge: 2023-08-05 | Disposition: A | Discharge status: Home / Self Care | Payer: 59 | Source: Ambulatory Visit | Attending: Acute Care | Admitting: Acute Care

## 2023-08-05 DIAGNOSIS — I700 Atherosclerosis of aorta: Secondary | ICD-10-CM | POA: Diagnosis not present

## 2023-08-05 DIAGNOSIS — F17210 Nicotine dependence, cigarettes, uncomplicated: Secondary | ICD-10-CM | POA: Insufficient documentation

## 2023-08-05 DIAGNOSIS — R911 Solitary pulmonary nodule: Secondary | ICD-10-CM | POA: Diagnosis not present

## 2023-08-05 DIAGNOSIS — J432 Centrilobular emphysema: Secondary | ICD-10-CM | POA: Diagnosis not present

## 2023-08-11 ENCOUNTER — Ambulatory Visit (INDEPENDENT_AMBULATORY_CARE_PROVIDER_SITE_OTHER): Payer: 59 | Admitting: Physician Assistant

## 2023-08-11 ENCOUNTER — Ambulatory Visit (HOSPITAL_COMMUNITY)
Admission: RE | Admit: 2023-08-11 | Discharge: 2023-08-11 | Disposition: A | Discharge status: Home / Self Care | Payer: 59 | Source: Ambulatory Visit | Attending: Physician Assistant | Admitting: Physician Assistant

## 2023-08-11 VITALS — BP 129/81 | HR 74 | Temp 98.6°F | Ht 65.0 in | Wt 186.5 lb

## 2023-08-11 DIAGNOSIS — M7989 Other specified soft tissue disorders: Secondary | ICD-10-CM

## 2023-08-11 DIAGNOSIS — I872 Venous insufficiency (chronic) (peripheral): Secondary | ICD-10-CM

## 2023-08-11 DIAGNOSIS — I83891 Varicose veins of right lower extremities with other complications: Secondary | ICD-10-CM

## 2023-08-11 NOTE — Progress Notes (Signed)
 Requested by:  Rica Records, FNP [phone] [place],  [place] [address]  Reason for consultation: asymptomatic varicose veins    History of Present Illness   Jennifer Rollins is a 53 y.o. (Aug 08, 1970) female who presents for evaluation of asymptomatic varicose veins. She says she has had large varicose vein in her right thigh/ behind knee for about 5 years. Has increased in size over this time. She also has a couple spider veins. These do not bother her. She has more recently in last couple months developed some pelvic varicosities as well. She wants to make sure there is nothing for her to be concerned about with these. She also explains that when sitting her right leg will go numb. She also complains of her feet feeling ice cold at times and numb. She does have low back pain, sciatic nerve issues in past as well as fibromyalgia. She has swelling intermittently on prolonged standing but nothing severe. She does not regularly elevate. She has worn compression stockings in the past but not recently. She says she is very active and works job that requires prolonged standing gon hard surfaces. She has no history of DVT. No family history of DVT. She does have family history of venous disease.   Venous symptoms include: swelling, cold, groin pressure Onset/duration:  > 5 years  Aggravating factors: sitting, standing Alleviating factors: none Compression:  a long time ago Helps:  unsure Pain medications: none  Previous vein procedures:  no History of DVT:  No  Past Medical History:  Diagnosis Date   Abnormal Pap smear of cervix    Allergy    Anemia    Anxiety    Arthritis    Benign essential hypertension 06/14/2020   Cancer (HCC) 2016   Cervical   Cataract 2022   Chronic back pain    Chronic pelvic pain in female    COPD (chronic obstructive pulmonary disease) (HCC)    Depression    Fibromyalgia    GERD (gastroesophageal reflux disease) 1990   HPV in female     Hypothyroidism 02/25/2020   IBS (irritable bowel syndrome)    NASH (nonalcoholic steatohepatitis)    PTSD (post-traumatic stress disorder)    Sciatica of right side    Sleep apnea 2022    Past Surgical History:  Procedure Laterality Date   ABDOMINAL HYSTERECTOMY N/A 09/14/2014   Procedure: HYSTERECTOMY ABDOMINAL;  Surgeon: Lazaro Arms, MD;  Location: [facility];  Service: Gynecology;  Laterality: N/A;   BIOPSY  08/29/2022   Procedure: BIOPSY;  Surgeon: Lanelle Bal, DO;  Location[facility];  Service: Endoscopy;;   BIOPSY  12/30/2022   Procedure: BIOPSY;  Surgeon: Lanelle Bal, DO;  Location: [facility];  Service: Endoscopy;;   CESAREAN SECTION     CHOLECYSTECTOMY     COLONOSCOPY WITH PROPOFOL N/A 08/29/2022   Surgeon: Lanelle Bal, DO;  Normal exam.  Repeat in 10 years.   COLPOSCOPY VULVA W/ BIOPSY     ESOPHAGOGASTRODUODENOSCOPY (EGD) WITH PROPOFOL N/A 08/29/2022   Surgeon: Lanelle Bal, DO;   Severe reflux esophagitis biopsied, gastritis biopsied, nonbleeding gastric ulcer with no stigmata of bleeding, normal examined duodenum.  Gastric biopsies benign, no H. pylori.  Esophageal biopsies benign.   ESOPHAGOGASTRODUODENOSCOPY (EGD) WITH PROPOFOL N/A 12/30/2022   Procedure: ESOPHAGOGASTRODUODENOSCOPY (EGD) WITH PROPOFOL;  Surgeon: Lanelle Bal, DO;  [facility];  Service: Endoscopy;  Laterality: N/A;  9:45 am, asa  3   EXCISIONAL HEMORRHOIDECTOMY     FRACTURE SURGERY  2022   Right forearm   right forearm surgery Right    SALPINGOOPHORECTOMY Bilateral 09/14/2014   Procedure: SALPINGO OOPHORECTOMY;  Surgeon: Lazaro Arms, MD;  Location: [facility];  Service: Gynecology;  Laterality: Bilateral;   TUBAL LIGATION      Social History   Socioeconomic History   Marital status: Married    Spouse name: Not on file   Number of children: Not on file   Years of education: Not on file   Highest education level: GED or equivalent  Occupational History    Not on file  Tobacco Use   Smoking status: Every Day    Current packs/day: 1.50    Average packs/day: 1.5 packs/day for 30.0 years (45.0 ttl pk-yrs)    Types: Cigarettes   Smokeless tobacco: Never  Substance and Sexual Activity   Alcohol use: Not Currently    Comment: Never a heavy drinker   Drug use: Not Currently    Types: Marijuana    Comment: occassional use -last used "months ago".   Sexual activity: Yes    Birth control/protection: Surgical  Other Topics Concern   Not on file  Social History Narrative   Not on file   Social Drivers of Health   Financial Resource Strain: Low Risk  (03/09/2023)   Overall Financial Resource Strain (CARDIA)    Difficulty of Paying Living Expenses: Not hard at all  Food Insecurity: Medium Risk (03/21/2023)   Received from [facility]   Hunger Vital Sign    Worried About Running Out of Food in the Last Year: Never true    Ran Out of Food in the Last Year: Sometimes true  Transportation Needs: No Transportation Needs (03/21/2023)   Received from [facility]    In the past 12 months, has lack of reliable transportation kept you from medical appointments, meetings, work or from getting things needed for daily living? : No  Physical Activity: Inactive (03/09/2023)   Exercise Vital Sign    Days of Exercise per Week: 0 days    Minutes of Exercise per Session: 0 min  Stress: Stress Concern Present (03/09/2023)   [employer] of Occupational Health - Occupational Stress Questionnaire    Feeling of Stress : Very much  Social Connections: Moderately Isolated (03/09/2023)   Social Connection and Isolation Panel [NHANES]    Frequency of Communication with Friends and Family: More than three times a week    Frequency of Social Gatherings with Friends and Family: Once a week    Attends Religious Services: Never    Database administrator or Organizations: No    Attends Banker Meetings: Never    Marital Status:  Married  Catering manager Violence: Not At Risk (01/21/2023)   Humiliation, Afraid, Rape, and Kick questionnaire    Fear of Current or Ex-Partner: No    Emotionally Abused: No    Physically Abused: No    Sexually Abused: No    Family History  Problem Relation Age of Onset   Diabetes Mother    Heart attack Mother    COPD Mother    Anxiety disorder Mother    Arthritis Mother    Cancer Mother    Depression Mother    Hearing loss Mother    Kidney disease Mother    Miscarriages / [place] Mother    Alcohol abuse Father    ADD / ADHD Brother  Depression Brother    Drug abuse Brother    Cirrhosis Maternal Grandfather        ETOH   Colon cancer Maternal Grandfather    Learning disabilities Son     Current Outpatient Medications  Medication Sig Dispense Refill   pantoprazole (PROTONIX) 40 MG tablet Take 1 tablet (40 mg total) by mouth 2 (two) times daily. 60 tablet 3   rosuvastatin (CRESTOR) 5 MG tablet Take 1 tablet (5 mg total) by mouth daily. 90 tablet 3   traZODone (DESYREL) 150 MG tablet Take 1-2 tablets (150-300 mg total) by mouth at bedtime. 90 tablet 3   DULoxetine (CYMBALTA) 30 MG capsule Take 2 capsules (60 mg total) by mouth daily. (Patient not taking: Reported on 08/11/2023) 180 capsule 3   No current facility-administered medications for this visit.    Allergies  Allergen Reactions   Dulcolax [Bisacodyl] Nausea And Vomiting   Flagyl [Metronidazole] Itching   Latex Other (See Comments)   Lithium Itching    REVIEW OF SYSTEMS (negative unless checked):   Cardiac:  []  Chest pain or chest pressure? []  Shortness of breath upon activity? []  Shortness of breath when lying flat? []  Irregular heart rhythm?  Vascular:  []  Pain in calf, thigh, or hip brought on by walking? []  Pain in feet at night that wakes you up from your sleep? []  Blood clot in your veins? []  Leg swelling?  Pulmonary:  []  Oxygen at home? []  Productive cough? []  Wheezing?  Neurologic:   []  Sudden weakness in arms or legs? []  Sudden numbness in arms or legs? []  Sudden onset of difficult speaking or slurred speech? []  Temporary loss of vision in one eye? []  Problems with dizziness?  Gastrointestinal:  []  Blood in stool? []  Vomited blood?  Genitourinary:  []  Burning when urinating? []  Blood in urine?  Psychiatric:  []  Major depression  Hematologic:  []  Bleeding problems? []  Problems with blood clotting?  Dermatologic:  []  Rashes or ulcers?  Constitutional:  []  Fever or chills?  Ear/Nose/Throat:  []  Change in hearing? []  Nose bleeds? []  Sore throat?  Musculoskeletal:  []  Back pain? []  Joint pain? []  Muscle pain?   Physical Examination     Vitals:   08/11/23 1053  BP: 129/81  Pulse: 74  Temp: 98.6 F (37 C)  TempSrc: Temporal  SpO2: 95%  Weight: 186 lb 8 oz (84.6 kg)  Height: 5\' 5"  (1.651 m)   Body mass index is 31.04 kg/m.  General:  WDWN in NAD; vital signs documented above Gait: Normal  HENT: WNL, normocephalic Pulmonary: normal non-labored breathing , without wheezing Cardiac: regular HR Vascular Exam/Pulses:2+ DP and PT pulses bilaterally, feet warm and well perfused Extremities: with varicose veins of right proximal posterior leg, popliteal fossa and medial thigh, a couple spider veins on BLE, without reticular veins, without edema, without stasis pigmentation, without lipodermatosclerosis, without ulcers Musculoskeletal: no muscle wasting or atrophy  Neurologic: A&O X 3;  No focal weakness or paresthesias are detected Psychiatric:  The pt has Normal affect.  Non-invasive Vascular Imaging   BLE Venous Insufficiency Duplex (08/11/23):  RLE:  No DVT and SVT GSV reflux proximal thigh and knee GSV diameter 0.30-0.40 cm No SSV reflux  No deep venous reflux   Medical Decision Making   Jennifer Rollins is a 53 y.o. female who presents with: RLE chronic venous insufficiency with varicose veins.  Based on the patient's history  and examination, I recommend: daily elevation of 20-30 minutes above level of heart, daily  compression stocking use, exercise, weight reduction, refraining from prolonged sitting or standing.. I discussed with the patient the use of her 15-20 mm knee high compression stockings Patient was provided with information about vein health She can follow up as needed if she has any new or worsening symptoms    Graceann Congress, PA-C [facility] Office: [phone]  08/11/2023, 11:12 AM  Clinic MD:  Myra Gianotti

## 2023-08-20 ENCOUNTER — Other Ambulatory Visit: Payer: Self-pay | Admitting: Gastroenterology

## 2023-08-20 DIAGNOSIS — R110 Nausea: Secondary | ICD-10-CM

## 2023-08-20 DIAGNOSIS — K279 Peptic ulcer, site unspecified, unspecified as acute or chronic, without hemorrhage or perforation: Secondary | ICD-10-CM

## 2023-08-20 DIAGNOSIS — R1010 Upper abdominal pain, unspecified: Secondary | ICD-10-CM

## 2023-08-22 ENCOUNTER — Telehealth: Payer: Self-pay | Admitting: *Deleted

## 2023-08-22 DIAGNOSIS — J449 Chronic obstructive pulmonary disease, unspecified: Secondary | ICD-10-CM | POA: Diagnosis not present

## 2023-08-22 NOTE — Telephone Encounter (Signed)
 Rx sent 2 days ago. 08/20/23.

## 2023-08-22 NOTE — Telephone Encounter (Signed)
 Noted.

## 2023-08-22 NOTE — Telephone Encounter (Signed)
 Received refill request for pantoprazole 40mg  . Pt last OV 07/17/2023

## 2023-08-25 ENCOUNTER — Other Ambulatory Visit: Payer: Self-pay | Admitting: Family Medicine

## 2023-08-25 DIAGNOSIS — G4700 Insomnia, unspecified: Secondary | ICD-10-CM

## 2023-08-25 MED ORDER — TRAZODONE HCL 150 MG PO TABS: 150.0000 mg | ORAL_TABLET | Freq: Every day | ORAL | 3 refills | Status: DC

## 2023-08-25 NOTE — Telephone Encounter (Signed)
 Copied from CRM [id number]. Topic: Clinical - Medication Refill >> Aug 25, 2023  9:32 AM Marlow Baars wrote: Most Recent Primary Care Visit:  Provider: Rica Records  Department: [facility]  Visit Type: OFFICE VISIT  Date: 03/10/2023  Medication: traZODone (DESYREL) 150 MG tablet  Has the patient contacted their pharmacy? Yes   Is this the correct pharmacy for this prescription? Yes If no, delete pharmacy and type the correct one.  This is the patient's preferred pharmacy:  [facility] [address] [place] [address] [place] [address] [place] [address] Phone: [phone] Fax: [phone]   Has the prescription been filled recently? No  Is the patient out of the medication? Yes  Has the patient been seen for an appointment in the last year OR does the patient have an upcoming appointment? Yes  Can we respond through MyChart? Yes  Please assist patient further

## 2023-08-26 NOTE — Progress Notes (Deleted)
 Referring Provider: Wylene Men* Primary Care Physician:  Rica Records, FNP Primary GI Physician: Dr. Marletta Lor  No chief complaint on file.   HPI:   Jennifer Rollins is a 53 y.o. female with history of anxiety, arthritis, cervical cancer, COPD, depression, fibromyalgia, IBS, constipation, GERD, reflux esophagitis, PUD, gastroparesis, chronic elevated alkaline phosphatase with extensive work-up detailed below and now referred to [facility] with MRI/MRCP suggesting cirrhosis and recommended screening for [facility] every 6 months, presenting today for follow-up of abdominal distention, upper abdominal pain.   Last seen in the office 06/26/2023.  She had been experiencing increasing abdominal distention x 2 weeks with associated abdominal pain, flank pain, difficulty taking deep breaths.  On exam, her abdomen was full, but nontense.  Noted tenderness in the epigastric and LUQ region.  Etiology of her symptoms was not clear.  She denied postprandial bloating making gastroparesis or biliary less likely the etiology of her current symptoms. Planned to update abdominal ultrasound to rule out ascites as well as labs.  She was also counseled on gastroparesis diet.  Recommended hep A/B vaccine series due to cirrhosis.  Labs completed 06/26/2023 showing alk phos 259, creatinine 1.05.  CBC, lipase within normal limits.  Ultrasound 06/26/2023 showing cirrhosis with no focal liver lesion.  No presence of ascites mention.  Recommended starting Reglan 5 mg 3 times daily. This was sent to her pharmacy on 07/17/23.    Today:    Past Medical History:  Diagnosis Date   Abnormal Pap smear of cervix    Allergy    Anemia    Anxiety    Arthritis    Benign essential hypertension 06/14/2020   Cancer (HCC) 2016   Cervical   Cataract 2022   Chronic back pain    Chronic pelvic pain in female    COPD (chronic obstructive pulmonary disease) (HCC)    Depression    Fibromyalgia    GERD  (gastroesophageal reflux disease) 1990   HPV in female    Hypothyroidism 02/25/2020   IBS (irritable bowel syndrome)    NASH (nonalcoholic steatohepatitis)    PTSD (post-traumatic stress disorder)    Sciatica of right side    Sleep apnea 2022    Past Surgical History:  Procedure Laterality Date   ABDOMINAL HYSTERECTOMY N/A 09/14/2014   Procedure: HYSTERECTOMY ABDOMINAL;  Surgeon: Lazaro Arms, MD;  Location: [facility];  Service: Gynecology;  Laterality: N/A;   BIOPSY  08/29/2022   Procedure: BIOPSY;  Surgeon: Lanelle Bal, DO;  [facility];  Service: Endoscopy;;   BIOPSY  12/30/2022   Procedure: BIOPSY;  Surgeon: Lanelle Bal, DO;  [facility];  Service: Endoscopy;;   CESAREAN SECTION     CHOLECYSTECTOMY     COLONOSCOPY WITH PROPOFOL N/A 08/29/2022   Surgeon: Lanelle Bal, DO;  Normal exam.  Repeat in 10 years.   COLPOSCOPY VULVA W/ BIOPSY     ESOPHAGOGASTRODUODENOSCOPY (EGD) WITH PROPOFOL N/A 08/29/2022   Surgeon: Lanelle Bal, DO;   Severe reflux esophagitis biopsied, gastritis biopsied, nonbleeding gastric ulcer with no stigmata of bleeding, normal examined duodenum.  Gastric biopsies benign, no H. pylori.  Esophageal biopsies benign.   ESOPHAGOGASTRODUODENOSCOPY (EGD) WITH PROPOFOL N/A 12/30/2022   Procedure: ESOPHAGOGASTRODUODENOSCOPY (EGD) WITH PROPOFOL;  Surgeon: Lanelle Bal, DO;  [facility];  Service: Endoscopy;  Laterality: N/A;  9:45 am, asa 3   EXCISIONAL HEMORRHOIDECTOMY     FRACTURE SURGERY  2022  Right forearm   right forearm surgery Right    SALPINGOOPHORECTOMY Bilateral 09/14/2014   Procedure: SALPINGO OOPHORECTOMY;  Surgeon: Lazaro Arms, MD;  Location: [facility];  Service: Gynecology;  Laterality: Bilateral;   TUBAL LIGATION      Current Outpatient Medications  Medication Sig Dispense Refill   DULoxetine (CYMBALTA) 30 MG capsule Take 2 capsules (60 mg total) by mouth daily. (Patient not taking: Reported  on 08/11/2023) 180 capsule 3   pantoprazole (PROTONIX) 40 MG tablet Take 1 tablet by mouth twice daily 60 tablet 5   rosuvastatin (CRESTOR) 5 MG tablet Take 1 tablet (5 mg total) by mouth daily. 90 tablet 3   traZODone (DESYREL) 150 MG tablet Take 1-2 tablets (150-300 mg total) by mouth at bedtime. 90 tablet 3   No current facility-administered medications for this visit.    Allergies as of 08/28/2023 - Review Complete 08/11/2023  Allergen Reaction Noted   Dulcolax [bisacodyl] Nausea And Vomiting 08/27/2022   Flagyl [metronidazole] Itching 01/15/2015   Latex Other (See Comments) 02/08/2013   Lithium Itching 08/01/2022    Family History  Problem Relation Age of Onset   Diabetes Mother    Heart attack Mother    COPD Mother    Anxiety disorder Mother    Arthritis Mother    Cancer Mother    Depression Mother    Hearing loss Mother    Kidney disease Mother    Miscarriages / [place] Mother    Alcohol abuse Father    ADD / ADHD Brother    Depression Brother    Drug abuse Brother    Cirrhosis Maternal Grandfather        ETOH   Colon cancer Maternal Grandfather    Learning disabilities Son     Social History   Socioeconomic History   Marital status: Married    Spouse name: Not on file   Number of children: Not on file   Years of education: Not on file   Highest education level: GED or equivalent  Occupational History   Not on file  Tobacco Use   Smoking status: Every Day    Current packs/day: 1.50    Average packs/day: 1.5 packs/day for 30.0 years (45.0 ttl pk-yrs)    Types: Cigarettes   Smokeless tobacco: Never  Substance and Sexual Activity   Alcohol use: Not Currently    Comment: Never a heavy drinker   Drug use: Not Currently    Types: Marijuana    Comment: occassional use -last used "months ago".   Sexual activity: Yes    Birth control/protection: Surgical  Other Topics Concern   Not on file  Social History Narrative   Not on file   Social Drivers of  Health   Financial Resource Strain: Low Risk  (03/09/2023)   Overall Financial Resource Strain (CARDIA)    Difficulty of Paying Living Expenses: Not hard at all  Food Insecurity: Medium Risk (03/21/2023)   Received from [facility]   Hunger Vital Sign    Worried About Running Out of Food in the Last Year: Never true    Ran Out of Food in the Last Year: Sometimes true  Transportation Needs: No Transportation Needs (03/21/2023)   Received from [facility]    In the past 12 months, has lack of reliable transportation kept you from medical appointments, meetings, work or from getting things needed for daily living? : No  Physical Activity: Inactive (03/09/2023)   Exercise Vital Sign  Days of Exercise per Week: 0 days    Minutes of Exercise per Session: 0 min  Stress: Stress Concern Present (03/09/2023)   [employer] of Occupational Health - Occupational Stress Questionnaire    Feeling of Stress : Very much  Social Connections: Moderately Isolated (03/09/2023)   Social Connection and Isolation Panel [NHANES]    Frequency of Communication with Friends and Family: More than three times a week    Frequency of Social Gatherings with Friends and Family: Once a week    Attends Religious Services: Never    Database administrator or Organizations: No    Attends Engineer, structural: Never    Marital Status: Married    Review of Systems: Gen: Denies fever, chills, anorexia. Denies fatigue, weakness, weight loss.  CV: Denies chest pain, palpitations, syncope, peripheral edema, and claudication. Resp: Denies dyspnea at rest, cough, wheezing, coughing up blood, and pleurisy. GI: Denies vomiting blood, jaundice, and fecal incontinence.   Denies dysphagia or odynophagia. Derm: Denies rash, itching, dry skin Psych: Denies depression, anxiety, memory loss, confusion. No homicidal or suicidal ideation.  Heme: Denies bruising, bleeding, and enlarged lymph  nodes.  Physical Exam: LMP 08/19/2014  General:   Alert and oriented. No distress noted. Pleasant and cooperative.  Head:  Normocephalic and atraumatic. Eyes:  Conjuctiva clear without scleral icterus. Heart:  S1, S2 present without murmurs appreciated. Lungs:  Clear to auscultation bilaterally. No wheezes, rales, or rhonchi. No distress.  Abdomen:  +BS, soft, non-tender and non-distended. No rebound or guarding. No HSM or masses noted. Msk:  Symmetrical without gross deformities. Normal posture. Extremities:  Without edema. Neurologic:  Alert and  oriented x4 Psych:  Normal mood and affect.    Assessment:     Plan:  ***   Ermalinda Memos, PA-C [facility] 08/28/2023

## 2023-08-27 ENCOUNTER — Telehealth: Payer: Self-pay

## 2023-08-27 NOTE — Telephone Encounter (Signed)
 Copied from CRM [id number]. Topic: Clinical - Lab/Test Results >> Aug 27, 2023  1:47 PM Payton Doughty wrote: Reason for CRM: pt has CT lung done at [facility] on 3/11.  Pt was told to call Dr Alexandria Lodge for the results.  There are no notes.  Please advise [phone]  I called and spoke to pt. Pt states she needs the CT results from [facility] from March 2025. I informed pt that [facility] is NOT [facility], therefore, we do not automatically receive those results. I informed pt that she needed to contact the hospital and have them fax our office a copy of the CT so we could have Maralyn Sago review it. I gave pt our fax number and told them to forward the ATTN to Sarah. Pt verbalized understanding. NFN

## 2023-08-28 ENCOUNTER — Ambulatory Visit: Payer: 59 | Admitting: Gastroenterology

## 2023-09-01 ENCOUNTER — Other Ambulatory Visit: Payer: Self-pay | Admitting: Acute Care

## 2023-09-01 DIAGNOSIS — Z122 Encounter for screening for malignant neoplasm of respiratory organs: Secondary | ICD-10-CM

## 2023-09-01 DIAGNOSIS — Z87891 Personal history of nicotine dependence: Secondary | ICD-10-CM

## 2023-09-01 DIAGNOSIS — F17210 Nicotine dependence, cigarettes, uncomplicated: Secondary | ICD-10-CM

## 2023-09-04 ENCOUNTER — Ambulatory Visit: Admitting: Gastroenterology

## 2023-09-04 ENCOUNTER — Other Ambulatory Visit: Payer: Self-pay | Admitting: Family Medicine

## 2023-09-04 DIAGNOSIS — G4700 Insomnia, unspecified: Secondary | ICD-10-CM

## 2023-09-04 MED ORDER — TRAZODONE HCL 150 MG PO TABS: 150.0000 mg | ORAL_TABLET | Freq: Every day | ORAL | 3 refills | Status: AC

## 2023-09-07 NOTE — Progress Notes (Deleted)
 Referring Provider: Del Orbe Polanco, Ilian* Primary Care Physician:  Rosanna Comment, FNP Primary GI Physician: Dr. Jenney Modest chief complaint on file.   HPI:   Jennifer Rollins is a 53 y.o. female  with history of anxiety, arthritis, cervical cancer, COPD, depression, fibromyalgia, IBS, constipation, GERD, reflux esophagitis, PUD, gastroparesis, chronic elevated alkaline phosphatase with extensive work-up detailed below and now referred to [facility] with MRI/MRCP suggesting cirrhosis and recommended screening for [facility] every 6 months, presenting today for follow-up of abdominal distention, upper abdominal pain.     Last seen in the office 06/26/2023.  She had been experiencing increasing abdominal distention x 2 weeks with associated abdominal pain, flank pain, difficulty taking deep breaths.  On exam, her abdomen was full, but nontense.  Noted tenderness in the epigastric and LUQ region.  Etiology of her symptoms was not clear.  She denied postprandial bloating making gastroparesis or biliary less likely the etiology of her current symptoms. Planned to update abdominal ultrasound to rule out ascites as well as labs.  She was also counseled on gastroparesis diet.  Recommended hep A/B vaccine series due to cirrhosis.   Labs completed 06/26/2023 showing alk phos 259, creatinine 1.05.  CBC, lipase within normal limits.   Ultrasound 06/26/2023 showing cirrhosis with no focal liver lesion.  No presence of ascites mention.   Recommended starting Reglan 5 mg 3 times daily. This was sent to her pharmacy on 07/17/23.      Today:     Past Medical History:  Diagnosis Date   Abnormal Pap smear of cervix    Allergy    Anemia    Anxiety    Arthritis    Benign essential hypertension 06/14/2020   Cancer (HCC) 2016   Cervical   Cataract 2022   Chronic back pain    Chronic pelvic pain in female    COPD (chronic obstructive pulmonary disease) (HCC)    Depression    Fibromyalgia     GERD (gastroesophageal reflux disease) 1990   HPV in female    Hypothyroidism 02/25/2020   IBS (irritable bowel syndrome)    NASH (nonalcoholic steatohepatitis)    PTSD (post-traumatic stress disorder)    Sciatica of right side    Sleep apnea 2022    Past Surgical History:  Procedure Laterality Date   ABDOMINAL HYSTERECTOMY N/A 09/14/2014   Procedure: HYSTERECTOMY ABDOMINAL;  Surgeon: Wendelyn Halter, MD;  Location: [facility];  Service: Gynecology;  Laterality: N/A;   BIOPSY  08/29/2022   Procedure: BIOPSY;  Surgeon: Vinetta Greening, DO;  Location: [facility];  Service: Endoscopy;;   BIOPSY  12/30/2022   Procedure: BIOPSY;  Surgeon: Vinetta Greening, DO;  Location: [facility];  Service: Endoscopy;;   CESAREAN SECTION     CHOLECYSTECTOMY     COLONOSCOPY WITH PROPOFOL N/A 08/29/2022   Surgeon: Vinetta Greening, DO;  Normal exam.  Repeat in 10 years.   COLPOSCOPY VULVA W/ BIOPSY     ESOPHAGOGASTRODUODENOSCOPY (EGD) WITH PROPOFOL N/A 08/29/2022   Surgeon: Vinetta Greening, DO;   Severe reflux esophagitis biopsied, gastritis biopsied, nonbleeding gastric ulcer with no stigmata of bleeding, normal examined duodenum.  Gastric biopsies benign, no H. pylori.  Esophageal biopsies benign.   ESOPHAGOGASTRODUODENOSCOPY (EGD) WITH PROPOFOL N/A 12/30/2022   Procedure: ESOPHAGOGASTRODUODENOSCOPY (EGD) WITH PROPOFOL;  Surgeon: Vinetta Greening, DO;  Location: [facility];  Service: Endoscopy;  Laterality: N/A;  9:45 am, asa 3   EXCISIONAL  HEMORRHOIDECTOMY     FRACTURE SURGERY  2022   Right forearm   right forearm surgery Right    SALPINGOOPHORECTOMY Bilateral 09/14/2014   Procedure: SALPINGO OOPHORECTOMY;  Surgeon: Wendelyn Halter, MD;  Location: [facility];  Service: Gynecology;  Laterality: Bilateral;   TUBAL LIGATION      Current Outpatient Medications  Medication Sig Dispense Refill   DULoxetine (CYMBALTA) 30 MG capsule Take 2 capsules (60 mg total) by mouth daily. (Patient not taking:  Reported on 08/11/2023) 180 capsule 3   pantoprazole (PROTONIX) 40 MG tablet Take 1 tablet by mouth twice daily 60 tablet 5   rosuvastatin (CRESTOR) 5 MG tablet Take 1 tablet (5 mg total) by mouth daily. 90 tablet 3   traZODone (DESYREL) 150 MG tablet Take 1-2 tablets (150-300 mg total) by mouth at bedtime. 90 tablet 3   No current facility-administered medications for this visit.    Allergies as of 09/08/2023 - Review Complete 08/11/2023  Allergen Reaction Noted   Dulcolax [bisacodyl] Nausea And Vomiting 08/27/2022   Flagyl [metronidazole] Itching 01/15/2015   Latex Other (See Comments) 02/08/2013   Lithium Itching 08/01/2022    Family History  Problem Relation Age of Onset   Diabetes Mother    Heart attack Mother    COPD Mother    Anxiety disorder Mother    Arthritis Mother    Cancer Mother    Depression Mother    Hearing loss Mother    Kidney disease Mother    Miscarriages / [place] Mother    Alcohol abuse Father    ADD / ADHD Brother    Depression Brother    Drug abuse Brother    Cirrhosis Maternal Grandfather        ETOH   Colon cancer Maternal Grandfather    Learning disabilities Son     Social History   Socioeconomic History   Marital status: Married    Spouse name: Not on file   Number of children: Not on file   Years of education: Not on file   Highest education level: GED or equivalent  Occupational History   Not on file  Tobacco Use   Smoking status: Every Day    Current packs/day: 1.50    Average packs/day: 1.5 packs/day for 30.0 years (45.0 ttl pk-yrs)    Types: Cigarettes   Smokeless tobacco: Never  Substance and Sexual Activity   Alcohol use: Not Currently    Comment: Never a heavy drinker   Drug use: Not Currently    Types: Marijuana    Comment: occassional use -last used "months ago".   Sexual activity: Yes    Birth control/protection: Surgical  Other Topics Concern   Not on file  Social History Narrative   Not on file   Social  Drivers of Health   Financial Resource Strain: Low Risk  (03/09/2023)   Overall Financial Resource Strain (CARDIA)    Difficulty of Paying Living Expenses: Not hard at all  Food Insecurity: Medium Risk (03/21/2023)   Received from [facility]   Hunger Vital Sign    Worried About Running Out of Food in the Last Year: Never true    Ran Out of Food in the Last Year: Sometimes true  Transportation Needs: No Transportation Needs (03/21/2023)   Received from [facility]    In the past 12 months, has lack of reliable transportation kept you from medical appointments, meetings, work or from getting things needed for daily living? : No  Physical Activity: Inactive (03/09/2023)   Exercise Vital Sign    Days of Exercise per Week: 0 days    Minutes of Exercise per Session: 0 min  Stress: Stress Concern Present (03/09/2023)   [employer] of Occupational Health - Occupational Stress Questionnaire    Feeling of Stress : Very much  Social Connections: Moderately Isolated (03/09/2023)   Social Connection and Isolation Panel [NHANES]    Frequency of Communication with Friends and Family: More than three times a week    Frequency of Social Gatherings with Friends and Family: Once a week    Attends Religious Services: Never    Database administrator or Organizations: No    Attends Engineer, structural: Never    Marital Status: Married    Review of Systems: Gen: Denies fever, chills, anorexia. Denies fatigue, weakness, weight loss.  CV: Denies chest pain, palpitations, syncope, peripheral edema, and claudication. Resp: Denies dyspnea at rest, cough, wheezing, coughing up blood, and pleurisy. GI: Denies vomiting blood, jaundice, and fecal incontinence.   Denies dysphagia or odynophagia. Derm: Denies rash, itching, dry skin Psych: Denies depression, anxiety, memory loss, confusion. No homicidal or suicidal ideation.  Heme: Denies bruising, bleeding, and enlarged  lymph nodes.  Physical Exam: LMP 08/19/2014  General:   Alert and oriented. No distress noted. Pleasant and cooperative.  Head:  Normocephalic and atraumatic. Eyes:  Conjuctiva clear without scleral icterus. Heart:  S1, S2 present without murmurs appreciated. Lungs:  Clear to auscultation bilaterally. No wheezes, rales, or rhonchi. No distress.  Abdomen:  +BS, soft, non-tender and non-distended. No rebound or guarding. No HSM or masses noted. Msk:  Symmetrical without gross deformities. Normal posture. Extremities:  Without edema. Neurologic:  Alert and  oriented x4 Psych:  Normal mood and affect.    Assessment:     Plan:  ***   Shana Daring, PA-C [facility] 09/08/2023

## 2023-09-08 ENCOUNTER — Ambulatory Visit: Admitting: Gastroenterology

## 2023-09-10 DIAGNOSIS — R030 Elevated blood-pressure reading, without diagnosis of hypertension: Secondary | ICD-10-CM | POA: Diagnosis not present

## 2023-09-10 DIAGNOSIS — R0981 Nasal congestion: Secondary | ICD-10-CM | POA: Diagnosis not present

## 2023-09-18 ENCOUNTER — Encounter: Payer: Self-pay | Admitting: Radiology

## 2023-09-22 ENCOUNTER — Other Ambulatory Visit (HOSPITAL_COMMUNITY): Payer: Self-pay | Admitting: Nurse Practitioner

## 2023-09-22 DIAGNOSIS — J449 Chronic obstructive pulmonary disease, unspecified: Secondary | ICD-10-CM | POA: Diagnosis not present

## 2023-09-22 DIAGNOSIS — R748 Abnormal levels of other serum enzymes: Secondary | ICD-10-CM

## 2023-09-22 DIAGNOSIS — K7581 Nonalcoholic steatohepatitis (NASH): Secondary | ICD-10-CM | POA: Diagnosis not present

## 2023-09-22 DIAGNOSIS — L299 Pruritus, unspecified: Secondary | ICD-10-CM | POA: Diagnosis not present

## 2023-09-22 DIAGNOSIS — K7469 Other cirrhosis of liver: Secondary | ICD-10-CM

## 2023-10-06 ENCOUNTER — Emergency Department (HOSPITAL_COMMUNITY)

## 2023-10-06 ENCOUNTER — Other Ambulatory Visit: Payer: Self-pay

## 2023-10-06 ENCOUNTER — Ambulatory Visit (HOSPITAL_COMMUNITY)
Admission: RE | Admit: 2023-10-06 | Discharge: 2023-10-06 | Disposition: A | Discharge status: Home / Self Care | Source: Ambulatory Visit | Attending: Nurse Practitioner | Admitting: Nurse Practitioner

## 2023-10-06 ENCOUNTER — Emergency Department (HOSPITAL_COMMUNITY)
Admission: EM | Admit: 2023-10-06 | Discharge: 2023-10-06 | Disposition: A | Discharge status: Home / Self Care | Attending: Emergency Medicine | Admitting: Emergency Medicine

## 2023-10-06 ENCOUNTER — Encounter (HOSPITAL_COMMUNITY): Payer: Self-pay

## 2023-10-06 DIAGNOSIS — K7460 Unspecified cirrhosis of liver: Secondary | ICD-10-CM | POA: Diagnosis not present

## 2023-10-06 DIAGNOSIS — K7469 Other cirrhosis of liver: Secondary | ICD-10-CM

## 2023-10-06 DIAGNOSIS — R051 Acute cough: Secondary | ICD-10-CM | POA: Insufficient documentation

## 2023-10-06 DIAGNOSIS — Z91040 Latex allergy status: Secondary | ICD-10-CM | POA: Insufficient documentation

## 2023-10-06 DIAGNOSIS — R059 Cough, unspecified: Secondary | ICD-10-CM | POA: Diagnosis not present

## 2023-10-06 DIAGNOSIS — K7689 Other specified diseases of liver: Secondary | ICD-10-CM | POA: Diagnosis not present

## 2023-10-06 DIAGNOSIS — J449 Chronic obstructive pulmonary disease, unspecified: Secondary | ICD-10-CM | POA: Insufficient documentation

## 2023-10-06 DIAGNOSIS — R748 Abnormal levels of other serum enzymes: Secondary | ICD-10-CM | POA: Insufficient documentation

## 2023-10-06 DIAGNOSIS — Z9049 Acquired absence of other specified parts of digestive tract: Secondary | ICD-10-CM | POA: Diagnosis not present

## 2023-10-06 DIAGNOSIS — R0602 Shortness of breath: Secondary | ICD-10-CM | POA: Insufficient documentation

## 2023-10-06 DIAGNOSIS — K7581 Nonalcoholic steatohepatitis (NASH): Secondary | ICD-10-CM

## 2023-10-06 DIAGNOSIS — J3489 Other specified disorders of nose and nasal sinuses: Secondary | ICD-10-CM | POA: Insufficient documentation

## 2023-10-06 LAB — URINALYSIS, ROUTINE W REFLEX MICROSCOPIC
Bilirubin Urine: NEGATIVE
Glucose, UA: NEGATIVE mg/dL
Hgb urine dipstick: NEGATIVE
Ketones, ur: NEGATIVE mg/dL
Leukocytes,Ua: NEGATIVE
Nitrite: NEGATIVE
Protein, ur: NEGATIVE mg/dL
Specific Gravity, Urine: 1.006 (ref 1.005–1.030)
pH: 6 (ref 5.0–8.0)

## 2023-10-06 LAB — CBC WITH DIFFERENTIAL/PLATELET
Abs Immature Granulocytes: 0.01 10*3/uL (ref 0.00–0.07)
Basophils Absolute: 0.1 10*3/uL (ref 0.0–0.1)
Basophils Relative: 1 %
Eosinophils Absolute: 0.1 10*3/uL (ref 0.0–0.5)
Eosinophils Relative: 2 %
HCT: 43.1 % (ref 36.0–46.0)
Hemoglobin: 13.6 g/dL (ref 12.0–15.0)
Immature Granulocytes: 0 %
Lymphocytes Relative: 26 %
Lymphs Abs: 1.4 10*3/uL (ref 0.7–4.0)
MCH: 28.1 pg (ref 26.0–34.0)
MCHC: 31.6 g/dL (ref 30.0–36.0)
MCV: 89 fL (ref 80.0–100.0)
Monocytes Absolute: 0.6 10*3/uL (ref 0.1–1.0)
Monocytes Relative: 12 %
Neutro Abs: 3.2 10*3/uL (ref 1.7–7.7)
Neutrophils Relative %: 59 %
Platelets: 302 10*3/uL (ref 150–400)
RBC: 4.84 MIL/uL (ref 3.87–5.11)
RDW: 14.3 % (ref 11.5–15.5)
WBC: 5.3 10*3/uL (ref 4.0–10.5)
nRBC: 0 % (ref 0.0–0.2)

## 2023-10-06 LAB — COMPREHENSIVE METABOLIC PANEL WITH GFR
ALT: 15 U/L (ref 0–44)
AST: 18 U/L (ref 15–41)
Albumin: 3.5 g/dL (ref 3.5–5.0)
Alkaline Phosphatase: 198 U/L — ABNORMAL HIGH (ref 38–126)
Anion gap: 10 (ref 5–15)
BUN: 15 mg/dL (ref 6–20)
CO2: 26 mmol/L (ref 22–32)
Calcium: 9 mg/dL (ref 8.9–10.3)
Chloride: 101 mmol/L (ref 98–111)
Creatinine, Ser: 1.02 mg/dL — ABNORMAL HIGH (ref 0.44–1.00)
GFR, Estimated: >60 mL/min (ref >60)
Glucose, Bld: 92 mg/dL (ref 70–99)
Potassium: 3.5 mmol/L (ref 3.5–5.1)
Sodium: 137 mmol/L (ref 135–145)
Total Bilirubin: 0.4 mg/dL (ref 0.0–1.2)
Total Protein: 8 g/dL (ref 6.5–8.1)

## 2023-10-06 LAB — D-DIMER, QUANTITATIVE: D-Dimer, Quant: 0.46 ug{FEU}/mL (ref 0.00–0.50)

## 2023-10-06 LAB — RESP PANEL BY RT-PCR (RSV, FLU A&B, COVID)  RVPGX2
Influenza A by PCR: NEGATIVE
Influenza B by PCR: NEGATIVE
Resp Syncytial Virus by PCR: NEGATIVE
SARS Coronavirus 2 by RT PCR: NEGATIVE

## 2023-10-06 MED ORDER — DOXYCYCLINE HYCLATE 100 MG PO CAPS: 100.0000 mg | ORAL_CAPSULE | Freq: Two times a day (BID) | ORAL | 0 refills | Status: DC

## 2023-10-06 MED ORDER — BENZONATATE 200 MG PO CAPS: 200.0000 mg | ORAL_CAPSULE | Freq: Three times a day (TID) | ORAL | 0 refills | Status: DC | PRN

## 2023-10-06 NOTE — ED Triage Notes (Signed)
 Pt arrived via POV from home c/o SOB, endorsing it feels like her lungs are on fire since Wednesday last week. Pt reports congestion, sinus pressure.

## 2023-10-06 NOTE — Discharge Instructions (Signed)
 Please take the antibiotic as directed.  You have also been prescribed medication to help with your cough symptoms.  You may take Tylenol  every 4 hours if needed for fever and/or bodyaches.  Drink plenty of fluids.  Take the antibiotic as directed until finished.  Please follow-up with your primary care provider for recheck later this week, return to the emergency department if you develop any new or worsening symptoms.

## 2023-10-06 NOTE — ED Notes (Signed)
 Pt refusing EKG for SOB

## 2023-10-09 NOTE — ED Provider Notes (Signed)
 [place] EMERGENCY DEPARTMENT AT [facility] Provider Note   CSN: [id number] Arrival date & time: 10/06/23  1306     History  Chief Complaint  Patient presents with   Shortness of Breath    Jennifer Rollins is a 53 y.o. female.   Shortness of Breath Associated symptoms: cough   Associated symptoms: no chest pain, no fever, no headaches, no sore throat and no vomiting        Jennifer Rollins is a 53 y.o. female with past medical history of COPD, fibromyalgia, anemia, IBS hypertension and NASH who presents to the Emergency Department complaining of shortness of breath, nasal congestion and sinus pressure.  Symptoms x 1 week.  She states that she feels that her "lungs are on fire" especially when she coughs.  Her cough is mostly been nonproductive.  She denies known fever or chest pain.  No abdominal pain nausea or vomiting.  Home Medications Prior to Admission medications   Medication Sig Start Date End Date Taking? Authorizing Provider  benzonatate  (TESSALON ) 200 MG capsule Take 1 capsule (200 mg total) by mouth 3 (three) times daily as needed. Swallow whole, do not chew 10/06/23  Yes Elzy Tomasello, PA-C  doxycycline  (VIBRAMYCIN ) 100 MG capsule Take 1 capsule (100 mg total) by mouth 2 (two) times daily. 10/06/23  Yes Daionna Crossland, PA-C  cholestyramine (QUESTRAN) 4 g packet Take 4 g by mouth 2 (two) times daily as needed. 09/22/23   [provider]  DULoxetine  (CYMBALTA ) 30 MG capsule Take 2 capsules (60 mg total) by mouth daily. Patient not taking: Reported on 08/11/2023 11/07/22   Del Orbe Polanco, Iliana, FNP  pantoprazole  (PROTONIX ) 40 MG tablet Take 1 tablet by mouth twice daily 08/21/23   Harper, Kristen S, PA-C  rosuvastatin  (CRESTOR ) 5 MG tablet Take 1 tablet (5 mg total) by mouth daily. 03/11/23   Del Orbe Polanco, Iliana, FNP  traZODone  (DESYREL ) 150 MG tablet Take 1-2 tablets (150-300 mg total) by mouth at bedtime. 09/04/23   Del Orbe Polanco, Iliana, FNP   ursodiol (ACTIGALL) 300 MG capsule Take 300 mg by mouth 3 (three) times daily. 09/22/23 09/21/24  [provider]  dicyclomine  (BENTYL ) 20 MG tablet Take one every 6 hours for abd cramps Patient not taking: Reported on 09/30/2015 09/14/15 09/30/15  Zammit, Joseph, MD      Allergies    Dulcolax [bisacodyl ], Latex, Flagyl [metronidazole], and Lithium    Review of Systems   Review of Systems  Constitutional:  Positive for appetite change. Negative for chills and fever.  HENT:  Positive for congestion and sinus pressure. Negative for sore throat and trouble swallowing.   Respiratory:  Positive for cough and shortness of breath.   Cardiovascular:  Negative for chest pain and leg swelling.  Gastrointestinal:  Negative for diarrhea, nausea and vomiting.  Genitourinary:  Negative for dysuria.  Musculoskeletal:  Negative for back pain.  Neurological:  Negative for dizziness, weakness, numbness and headaches.    Physical Exam Updated Vital Signs BP 136/78   Pulse 79   Temp 97.7 F (36.5 C)   Resp 20   Ht 5\' 5"  (1.651 m)   Wt 83 kg   LMP 08/19/2014   SpO2 98%   BMI 30.45 kg/m  Physical Exam Vitals and nursing note reviewed.  Constitutional:      General: She is not in acute distress.    Appearance: Normal appearance. She is not ill-appearing or toxic-appearing.  HENT:     Nose: No congestion.  Mouth/Throat:     Mouth: Mucous membranes are moist.     Pharynx: No oropharyngeal exudate or posterior oropharyngeal erythema.  Eyes:     Conjunctiva/sclera: Conjunctivae normal.  Cardiovascular:     Rate and Rhythm: Normal rate and regular rhythm.     Pulses: Normal pulses.  Pulmonary:     Effort: Pulmonary effort is normal. No respiratory distress.     Breath sounds: No wheezing, rhonchi or rales.  Abdominal:     Palpations: Abdomen is soft.     Tenderness: There is no abdominal tenderness.  Musculoskeletal:     Cervical back: Normal range of motion. No tenderness.   Lymphadenopathy:     Cervical: No cervical adenopathy.  Skin:    General: Skin is warm.     Capillary Refill: Capillary refill takes less than 2 seconds.  Neurological:     General: No focal deficit present.     Mental Status: She is alert.     Sensory: No sensory deficit.     Motor: No weakness.     ED Results / Procedures / Treatments   Labs (all labs ordered are listed, but only abnormal results are displayed) Labs Reviewed  COMPREHENSIVE METABOLIC PANEL WITH GFR - Abnormal; Notable for the following components:      Result Value   Creatinine, Ser 1.02 (*)    Alkaline Phosphatase 198 (*)    All other components within normal limits  RESP PANEL BY RT-PCR (RSV, FLU A&B, COVID)  RVPGX2  CBC WITH DIFFERENTIAL/PLATELET  URINALYSIS, ROUTINE W REFLEX MICROSCOPIC  D-DIMER, QUANTITATIVE    EKG None  Radiology DG Chest 2 View Result Date: 10/06/2023 CLINICAL DATA:  Cough. EXAM: CHEST - 2 VIEW COMPARISON:  Chest radiograph 04/05/2023 FINDINGS: The heart size and mediastinal contours are within normal limits. Both lungs are clear. The visualized skeletal structures are unremarkable. IMPRESSION: No active cardiopulmonary disease. Electronically Signed   By: Jone Neither M.D.   On: 10/06/2023 14:52     Procedures Procedures    Medications Ordered in ED Medications - No data to display  ED Course/ Medical Decision Making/ A&P                                 Medical Decision Making Patient here with reported nasal congestion, sinus pressure, cough, shortness of breath and describes feeling that her lungs are on fire.  Symptoms for 1 week.  Shortness of breath is mostly associated with excessive coughing.  Cough mostly nonproductive.  No reported fever chills or hemoptysis.  On my exam, patient nontoxic-appearing.  Vital signs are reassuring.  No tachycardia hypoxia or tachypnea.  I suspect viral process, pneumonia, bronchitis PE also considered.  Low clinical suspicion for  ACS.  Amount and/or Complexity of Data Reviewed Labs: ordered.    Details: Labs unremarkable D-dimer reassuring Radiology: ordered.    Details: Chest x-ray without acute cardiopulmonary process ECG/medicine tests: ordered.    Details: EKG shows sinus rhythm Discussion of management or test interpretation with external provider(s):  On recheck, patient resting comfortably.  No increased work of breathing on exam.  States she is ready for discharge home.  She has had symptoms for 1 week and has history of COPD, therefore I feel that it is reasonable to treat with antibiotics.  Tessalon  Perles also given for her cough.  No hypoxia during her ER stay.  She will follow-up closely outpatient with PCP.  Risk Prescription drug management.           Final Clinical Impression(s) / ED Diagnoses Final diagnoses:  Acute cough    Rx / DC Orders ED Discharge Orders          Ordered    benzonatate  (TESSALON ) 200 MG capsule  3 times daily PRN        10/06/23 1617    doxycycline  (VIBRAMYCIN ) 100 MG capsule  2 times daily        10/06/23 1617              Catherne Clubs, PA-C 10/09/23 1220    Mozell Arias, MD 10/10/23 [phone]

## 2023-10-21 ENCOUNTER — Other Ambulatory Visit: Payer: Self-pay | Admitting: Family Medicine

## 2023-10-22 DIAGNOSIS — J449 Chronic obstructive pulmonary disease, unspecified: Secondary | ICD-10-CM | POA: Diagnosis not present

## 2023-10-24 NOTE — Telephone Encounter (Signed)
 I think it is unlikely her symptoms are related to the ursodiol but she can stop taking it for the time being.  If her symptoms resolve we can wait 2-3 weeks then restart the ursodiol taking 1 tablet three times a day instead of all three tablets at one time.  If her symptoms do not improve I recommend she contact [facility], NP or Dr. Cindie (her GI team) for further evaluation of RUQ pain.    Her recent US  showed no bile duct issues, she previously had a cholecystectomy, and has no ascites on imaging early in May and no findings to suggest portal hypertension.  If pain becomes severe she should go to the emergency room.

## 2023-10-24 NOTE — Telephone Encounter (Signed)
 I spoke to the patient and discussed Dawn's message in detail. She verbalized understanding of the plan. If symptoms resolve, she will hold ursodiol for 2-3 weeks, then start back at 1 tab TID (rather than taking all three tabs at once).  She will also contact GI so she can get an appointment in place, in case symptoms do not improve.   We review her recent ultrasound results again.   She is aware if pain becomes severe to proceed to ED

## 2023-11-22 DIAGNOSIS — J449 Chronic obstructive pulmonary disease, unspecified: Secondary | ICD-10-CM | POA: Diagnosis not present

## 2023-12-09 ENCOUNTER — Ambulatory Visit: Payer: 59

## 2023-12-16 ENCOUNTER — Ambulatory Visit (INDEPENDENT_AMBULATORY_CARE_PROVIDER_SITE_OTHER)

## 2023-12-16 VITALS — Ht 65.0 in | Wt 185.0 lb

## 2023-12-16 DIAGNOSIS — Z1231 Encounter for screening mammogram for malignant neoplasm of breast: Secondary | ICD-10-CM

## 2023-12-16 DIAGNOSIS — Z0000 Encounter for general adult medical examination without abnormal findings: Secondary | ICD-10-CM

## 2023-12-16 NOTE — Patient Instructions (Signed)
 Ms. Jennifer Rollins ,  Thank you for taking time out of your busy schedule to complete your Annual Wellness Visit with me. I enjoyed our conversation and look forward to speaking with you again next year. I, as well as your care team,  appreciate your ongoing commitment to your health goals. Please review the following plan we discussed and let me know if I can assist you in the future.  I enjoyed our conversation and look forward to it again next year. Blessing for the upcoming year!!  -Shakera Ebrahimi     TOGETHER, WE CAME UP WITH THE FOLLOWING GAME PLAN!  Referrals/Orders/Labs Placed Today: Mammogram: [facility] Phone: [phone] Follow up Visits Next Visit with PCP: 01/21/2024 at 11:20 am someone will call you if this changes Next Medicare Wellness w/ Health Advisor (1 year):12/19/2024 at 10:40 am Clinician Recommendations:  Aim for 30 minutes of exercise or brisk walking, 6-8 glasses of water, and 5 servings of fruits and vegetables each day.       This is a list of the screening recommended for you and due dates:  Health Maintenance  Topic Date Due   DTaP/Tdap/Td vaccine (1 - Tdap) Never done   Zoster (Shingles) Vaccine (2 of 2) 09/30/2021   COVID-19 Vaccine (3 - 2024-25 season) 01/26/2023   Flu Shot  12/26/2023   Medicare Annual Wellness Visit  12/14/2024   DEXA scan (bone density measurement)  09/21/2025   Pneumococcal Vaccine for age over 7  Completed   Hepatitis B Vaccine  Aged Out   HPV Vaccine  Aged Out   Meningitis B Vaccine  Aged Out   Hepatitis C Screening  Discontinued    Advanced directives: (Provided) Advance directive discussed with you today. I have provided a copy for you to complete at home and have notarized. Once this is complete, please bring a copy in to our office so we can scan it into your chart.  Advance Care Planning is important because it:  [x]  Makes sure you receive the medical care that is consistent with your values, goals, and preferences  [x]  It  provides guidance to your family and loved ones and reduces their decisional burden about whether or not they are making the right decisions based on your wishes.  Follow the link provided in your after visit summary or read over the paperwork we have mailed to you to help you started getting your Advance Directives in place. If you need assistance in completing these, please reach out to us  so that we can help you!  Follow the link provided in your after visit summary or read over the paperwork we have mailed to you to help you started getting your Advance Directives in place. If you need assistance in completing these, please reach out to us  so that we can help you!  We will mail you an Advanced Directives packet as we discussed during your visit today. You do not have to have an attorney to complete this paperwork. Read over the packet, discuss it with family, and complete it. Choose who will be making decisions for you in the event you can no longer make them for yourself. Once completed have them notarized, and bring the packet back to the office. We will scan it and make sure it gets into your chart.   If you choose to have a DNR (Do Not Resuscitate Order) you must get this from your primary care doctor because they have to sign it. You can get this in  the office during an appointment.   THIS ORDER MUST BE VISIBLE AT ALL TIMES WITHIN YOUR HOME SUCH AS ON A REFRIGERATOR.

## 2023-12-16 NOTE — Progress Notes (Signed)
 Please attest and cosign this visit due to patients primary care provider not being in the office at the time the visit was completed.   Subjective:   Jennifer Rollins is a 53 y.o. who presents for a Medicare Wellness preventive visit.  As a reminder, Annual Wellness Visits don't include a physical exam, and some assessments may be limited, especially if this visit is performed virtually. We may recommend an in-person follow-up visit with your provider if needed.  Visit Complete: Virtual I connected with  Jennifer Rollins on 12/16/23 by a audio enabled telemedicine application and verified that I am speaking with the correct person using two identifiers.  Patient Location: Home  Provider Location: Home Office  I discussed the limitations of evaluation and management by telemedicine. The patient expressed understanding and agreed to proceed.  Vital Signs: Because this visit was a virtual/telehealth visit, some criteria may be missing or patient reported. Any vitals not documented were not able to be obtained and vitals that have been documented are patient reported.  VideoDeclined- This patient declined Librarian, academic. Therefore the visit was completed with audio only.  Persons Participating in Visit: Patient.  AWV Questionnaire: Yes: Patient Medicare AWV questionnaire was completed by the patient on 12/09/2023; I have confirmed that all information answered by patient is correct and no changes since this date.  Cardiac Risk Factors include: dyslipidemia;hypertension;smoking/ tobacco exposure     Objective:    Today's Vitals   12/09/23 0813 12/16/23 0855  Weight:  185 lb (83.9 kg)  Height:  5' 5 (1.651 m)  PainSc: 8     Body mass index is 30.79 kg/m.     12/16/2023    8:55 AM 10/06/2023    1:29 PM 04/05/2023    4:36 PM 02/16/2023    6:06 PM 12/04/2022   10:26 AM 08/27/2022    2:39 PM 09/30/2015    2:01 PM  Advanced Directives  Does Patient Have a  Medical Advance Directive? No No No No No No No   Would patient like information on creating a medical advance directive? Yes (MAU/Ambulatory/Procedural Areas - Information given) No - Patient declined  No - Patient declined No - Patient declined No - Patient declined No - patient declined information      Data saved with a previous flowsheet row definition    Current Medications (verified) Outpatient Encounter Medications as of 12/16/2023  Medication Sig   benzonatate  (TESSALON ) 200 MG capsule Take 1 capsule (200 mg total) by mouth 3 (three) times daily as needed. Swallow whole, do not chew   doxycycline  (VIBRAMYCIN ) 100 MG capsule Take 1 capsule (100 mg total) by mouth 2 (two) times daily.   DULoxetine  (CYMBALTA ) 30 MG capsule Take 2 capsules by mouth once daily   pantoprazole  (PROTONIX ) 40 MG tablet Take 1 tablet by mouth twice daily   rosuvastatin  (CRESTOR ) 5 MG tablet Take 1 tablet (5 mg total) by mouth daily.   traZODone  (DESYREL ) 150 MG tablet Take 1-2 tablets (150-300 mg total) by mouth at bedtime.   ursodiol (ACTIGALL) 300 MG capsule Take 300 mg by mouth 3 (three) times daily.   cholestyramine (QUESTRAN) 4 g packet Take 4 g by mouth 2 (two) times daily as needed.   [DISCONTINUED] dicyclomine  (BENTYL ) 20 MG tablet Take one every 6 hours for abd cramps (Patient not taking: Reported on 09/30/2015)   No facility-administered encounter medications on file as of 12/16/2023.    Allergies (verified) Dulcolax [bisacodyl ], Latex, Flagyl [metronidazole], and Lithium  History: Past Medical History:  Diagnosis Date   Abnormal Pap smear of cervix    Allergy    Anemia    Anxiety    Arthritis    Benign essential hypertension 06/14/2020   Cancer (HCC) 2016   Cervical   Cataract 2022   Chronic back pain    Chronic pelvic pain in female    COPD (chronic obstructive pulmonary disease) (HCC)    Depression    Fibromyalgia    GERD (gastroesophageal reflux disease) 1990   HPV in female     Hypothyroidism 02/25/2020   IBS (irritable bowel syndrome)    NASH (nonalcoholic steatohepatitis)    PTSD (post-traumatic stress disorder)    Sciatica of right side    Sleep apnea 2022   Past Surgical History:  Procedure Laterality Date   ABDOMINAL HYSTERECTOMY N/A 09/14/2014   Procedure: HYSTERECTOMY ABDOMINAL;  Surgeon: Vonn VEAR Inch, MD;  Location: [facility];  Service: Gynecology;  Laterality: N/A;   BIOPSY  08/29/2022   Procedure: BIOPSY;  Surgeon: Cindie Carlin POUR, DO;  Location: [facility];  Service: Endoscopy;;   BIOPSY  12/30/2022   Procedure: BIOPSY;  Surgeon: Cindie Carlin POUR, DO;  Location: [facility];  Service: Endoscopy;;   CESAREAN SECTION     CHOLECYSTECTOMY     COLONOSCOPY WITH PROPOFOL  N/A 08/29/2022   Surgeon: Cindie Carlin POUR, DO;  Normal exam.  Repeat in 10 years.   COLPOSCOPY VULVA W/ BIOPSY     ESOPHAGOGASTRODUODENOSCOPY (EGD) WITH PROPOFOL  N/A 08/29/2022   Surgeon: Cindie Carlin POUR, DO;   Severe reflux esophagitis biopsied, gastritis biopsied, nonbleeding gastric ulcer with no stigmata of bleeding, normal examined duodenum.  Gastric biopsies benign, no H. pylori.  Esophageal biopsies benign.   ESOPHAGOGASTRODUODENOSCOPY (EGD) WITH PROPOFOL  N/A 12/30/2022   Procedure: ESOPHAGOGASTRODUODENOSCOPY (EGD) WITH PROPOFOL ;  Surgeon: Cindie Carlin POUR, DO;  Location: [facility];  Service: Endoscopy;  Laterality: N/A;  9:45 am, asa 3   EXCISIONAL HEMORRHOIDECTOMY     FRACTURE SURGERY  2022   Right forearm   right forearm surgery Right    SALPINGOOPHORECTOMY Bilateral 09/14/2014   Procedure: SALPINGO OOPHORECTOMY;  Surgeon: Vonn VEAR Inch, MD;  Location: [facility];  Service: Gynecology;  Laterality: Bilateral;   TUBAL LIGATION     Family History  Problem Relation Age of Onset   Diabetes Mother    Heart attack Mother    COPD Mother    Anxiety disorder Mother    Arthritis Mother    Cancer Mother    Depression Mother    Hearing loss Mother    Kidney disease  Mother    Miscarriages / [place] Mother    Alcohol abuse Father    ADD / ADHD Brother    Depression Brother    Drug abuse Brother    Cirrhosis Maternal Grandfather        ETOH   Colon cancer Maternal Grandfather    Learning disabilities Son    Social History   Socioeconomic History   Marital status: Married    Spouse name: Not on file   Number of children: Not on file   Years of education: Not on file   Highest education level: GED or equivalent  Occupational History   Not on file  Tobacco Use   Smoking status: Every Day    Current packs/day: 2.00    Average packs/day: 2.0 packs/day for 40.6 years (81.1 ttl pk-yrs)    Types: Cigarettes    Start date: 51  Smokeless tobacco: Never  Vaping Use   Vaping status: Never Used  Substance and Sexual Activity   Alcohol use: Not Currently    Comment: Never a heavy drinker   Drug use: Not Currently    Types: Marijuana    Comment: occassional use -last used months ago.   Sexual activity: Yes    Birth control/protection: Surgical  Other Topics Concern   Not on file  Social History Narrative   Not on file   Social Drivers of Health   Financial Resource Strain: Medium Risk (12/09/2023)   Overall Financial Resource Strain (CARDIA)    Difficulty of Paying Living Expenses: Somewhat hard  Food Insecurity: Food Insecurity Present (12/09/2023)   Hunger Vital Sign    Worried About Running Out of Food in the Last Year: Sometimes true    Ran Out of Food in the Last Year: Sometimes true  Transportation Needs: No Transportation Needs (12/09/2023)   PRAPARE - Administrator, Civil Service (Medical): No    Lack of Transportation (Non-Medical): No  Physical Activity: Insufficiently Active (12/09/2023)   Exercise Vital Sign    Days of Exercise per Week: 3 days    Minutes of Exercise per Session: 10 min  Stress: Stress Concern Present (12/09/2023)   [employer] of Occupational Health - Occupational Stress  Questionnaire    Feeling of Stress: To some extent  Social Connections: Moderately Isolated (12/09/2023)   Social Connection and Isolation Panel    Frequency of Communication with Friends and Family: More than three times a week    Frequency of Social Gatherings with Friends and Family: Once a week    Attends Religious Services: More than 4 times per year    Active Member of [employer] or Organizations: No    Attends Banker Meetings: Never    Marital Status: Separated    Tobacco Counseling Ready to quit: No Counseling given: Yes    Clinical Intake:  Pre-visit preparation completed: Yes  Pain : 0-10 Pain Score: 8  Pain Type: Chronic pain Pain Location: Calf Pain Orientation: Right, Left Pain Descriptors / Indicators: Spasm Pain Onset: In the past 7 days Pain Frequency: Constant     BMI - recorded: 30.79 Nutritional Status: BMI > 30  Obese Nutritional Risks: None Diabetes: No  Lab Results  Component Value Date   HGBA1C 6.2 (H) 11/07/2022   HGBA1C 5.8 (H) 08/06/2022     How often do you need to have someone help you when you read instructions, pamphlets, or other written materials from your doctor or pharmacy?: 1 - Never  Interpreter Needed?: No  Information entered by :: [facility]   Activities of Daily Living     12/09/2023    8:13 AM  In your present state of health, do you have any difficulty performing the following activities:  Hearing? 0  Vision? 1  Difficulty concentrating or making decisions? 1  Walking or climbing stairs? 0  Dressing or bathing? 0  Doing errands, shopping? 0  Preparing Food and eating ? N  Using the Toilet? N  In the past six months, have you accidently leaked urine? Y  Do you have problems with loss of bowel control? N  Managing your Medications? N  Managing your Finances? N  Housekeeping or managing your Housekeeping? N    Patient Care Team: Del Wilhelmena Falter, Hilario, FNP as PCP - General (Family  Medicine) Cindie Carlin POUR, DO as Consulting Physician (Gastroenterology)  I have updated your  Care Teams any recent Medical Services you may have received from other providers in the past year.     Assessment:   This is a routine wellness examination for Jennifer Rollins.  Hearing/Vision screen Hearing Screening - Comments:: Patient denies any hearing difficulties.   Vision Screening - Comments:: Wears rx glasses - up to date with routine eye exams with  Optometrist at [facility] location    Goals Addressed             This Visit's Progress    Patient Stated       Be healthier       Depression Screen     12/16/2023    9:10 AM 03/10/2023    8:12 AM 01/21/2023    8:36 AM 12/04/2022   10:30 AM 11/07/2022    8:49 AM 08/06/2022    8:10 AM  PHQ 2/9 Scores  PHQ - 2 Score 0 6 2 1 2 5   PHQ- 9 Score 0 18 11  14 17   Exception Documentation    Patient refusal      Fall Risk     12/09/2023    8:13 AM 03/10/2023    8:12 AM 12/04/2022   10:29 AM 12/03/2022    8:06 PM 11/07/2022    8:49 AM  Fall Risk   Falls in the past year? 0 0 1 1 1   Number falls in past yr: 0 0 0 0 0  Injury with Fall? 0 0 0 0 1  Risk for fall due to : No Fall Risks No Fall Risks No Fall Risks    Follow up Falls evaluation completed;Education provided;Falls prevention discussed Falls evaluation completed Falls prevention discussed      MEDICARE RISK AT HOME:  Medicare Risk at Home Any stairs in or around the home?: (Patient-Rptd) Yes If so, are there any without handrails?: (Patient-Rptd) No Home free of loose throw rugs in walkways, pet beds, electrical cords, etc?: (Patient-Rptd) No Adequate lighting in your home to reduce risk of falls?: (Patient-Rptd) Yes Life alert?: (Patient-Rptd) No Use of a cane, walker or w/c?: (Patient-Rptd) No Grab bars in the bathroom?: (Patient-Rptd) No Shower chair or bench in shower?: (Patient-Rptd) No Elevated toilet seat or a handicapped toilet?: (Patient-Rptd) No  TIMED UP  AND GO:  Was the test performed?  No  Cognitive Function: 6CIT completed        12/16/2023    9:07 AM 12/04/2022   10:28 AM  6CIT Screen  What Year? 0 points 0 points  What month? 0 points 0 points  What time? 0 points 0 points  Count back from 20 0 points 0 points  Months in reverse 0 points 0 points  Repeat phrase 0 points 0 points  Total Score 0 points 0 points    Immunizations  There is no immunization history on file for this patient.  Screening Tests Health Maintenance  Topic Date Due   DTaP/Tdap/Td (1 - Tdap) Never done   Pneumococcal Vaccine 71-29 Years old (1 of 2 - PCV) Never done   Hepatitis B Vaccines (1 of 3 - 19+ 3-dose series) Never done   Zoster Vaccines- Shingrix (1 of 2) Never done   COVID-19 Vaccine (2 - Moderna risk series) 11/29/2019   INFLUENZA VACCINE  12/26/2023   MAMMOGRAM  02/10/2024   Lung Cancer Screening  08/04/2024   Medicare Annual Wellness (AWV)  12/15/2024   Colonoscopy  08/28/2032   Hepatitis C Screening  Completed   HIV Screening  Completed  HPV VACCINES  Aged Out   Meningococcal B Vaccine  Aged Out    Health Maintenance  Health Maintenance Due  Topic Date Due   DTaP/Tdap/Td (1 - Tdap) Never done   Pneumococcal Vaccine 43-77 Years old (1 of 2 - PCV) Never done   Hepatitis B Vaccines (1 of 3 - 19+ 3-dose series) Never done   Zoster Vaccines- Shingrix (1 of 2) Never done   COVID-19 Vaccine (2 - Moderna risk series) 11/29/2019   Health Maintenance Items Addressed: Mammogram ordered  Additional Screening:  Vision Screening: Recommended annual ophthalmology exams for early detection of glaucoma and other disorders of the eye. Would you like a referral to an eye doctor? No    Dental Screening: Recommended annual dental exams for proper oral hygiene  Community Resource Referral / Chronic Care Management: CRR required this visit?  No   CCM required this visit?  No   Plan:    I have personally reviewed and noted the  following in the patient's chart:   Medical and social history Use of alcohol, tobacco or illicit drugs  Current medications and supplements including opioid prescriptions. Patient is not currently taking opioid prescriptions. Functional ability and status Nutritional status Physical activity Advanced directives List of other physicians Hospitalizations, surgeries, and ER visits in previous 12 months Vitals Screenings to include cognitive, depression, and falls Referrals and appointments  In addition, I have reviewed and discussed with patient certain preventive protocols, quality metrics, and best practice recommendations. A written personalized care plan for preventive services as well as general preventive health recommendations were provided to patient.   Jennifer Rollins, CMA   12/16/2023   After Visit Summary: (MyChart) Due to this being a telephonic visit, the after visit summary with patients personalized plan was offered to patient via MyChart   Notes: Nothing significant to report at this time.

## 2023-12-18 ENCOUNTER — Ambulatory Visit (INDEPENDENT_AMBULATORY_CARE_PROVIDER_SITE_OTHER): Admitting: Internal Medicine

## 2023-12-18 ENCOUNTER — Encounter: Payer: Self-pay | Admitting: Internal Medicine

## 2023-12-18 ENCOUNTER — Telehealth: Payer: Self-pay | Admitting: Family Medicine

## 2023-12-18 VITALS — BP 127/77 | HR 77 | Ht 65.0 in | Wt 183.4 lb

## 2023-12-18 DIAGNOSIS — R7303 Prediabetes: Secondary | ICD-10-CM | POA: Diagnosis not present

## 2023-12-18 DIAGNOSIS — K760 Fatty (change of) liver, not elsewhere classified: Secondary | ICD-10-CM | POA: Diagnosis not present

## 2023-12-18 DIAGNOSIS — K219 Gastro-esophageal reflux disease without esophagitis: Secondary | ICD-10-CM

## 2023-12-18 DIAGNOSIS — M797 Fibromyalgia: Secondary | ICD-10-CM

## 2023-12-18 DIAGNOSIS — R252 Cramp and spasm: Secondary | ICD-10-CM | POA: Diagnosis not present

## 2023-12-18 DIAGNOSIS — R5382 Chronic fatigue, unspecified: Secondary | ICD-10-CM | POA: Diagnosis not present

## 2023-12-18 NOTE — Telephone Encounter (Signed)
 Patient is requesting to switch providers and establish care with Leita. Please advise on scheduling Thank you

## 2023-12-18 NOTE — Patient Instructions (Addendum)
 Please maintain at least 64 ounces of fluid intake in a day.  Please continue to take other medications as prescribed.  Please continue to follow low carb diet and perform moderate exercise/walking as tolerated.

## 2023-12-18 NOTE — Assessment & Plan Note (Signed)
 Could be related to electrolyte deficiency, especially hypokalemia Advised to try mustard or tonic water Check CMP

## 2023-12-18 NOTE — Assessment & Plan Note (Signed)
 Has chronic myalgias On Cymbalta  for GAD, can also help with fibromyalgia On trazodone  for insomnia

## 2023-12-18 NOTE — Assessment & Plan Note (Signed)
 Lab Results  Component Value Date   HGBA1C 6.2 (H) 11/07/2022   Recheck HbA1c Advised to follow low-carb diet

## 2023-12-18 NOTE — Assessment & Plan Note (Signed)
 Reports chronic fatigue Likely multifactorial, including related to fibromyalgia, dehydration and/or electrolyte deficiency Check CBC, CMP and TSH Advised to maintain adequate hydration and eat at regular intervals

## 2023-12-18 NOTE — Progress Notes (Signed)
 Acute Office Visit  Subjective:    Patient ID: Jennifer Rollins, female    DOB: [birth year], 53 y.o.   MRN: [id number]  Chief Complaint  Patient presents with   Spasms    Muscle spasms in the back of the legs   no appetite    No appetite along with fatigue     HPI Patient is in today for complaint of muscle spasms in the RLE, which are chronic, but have been worse in the last 1 week.  She reports muscle aches in the upper chest wall/axillary area as well.  Denies any localized warmth or erythema.  She reports history of fibromyalgia, but reports that her symptoms are usually worse in the winter months.  She currently takes Cymbalta  for GAD, but denies any improvement in her fibromyalgia symptoms with it.  Reports having good sleep quality in general with trazodone .  She also reports lack of appetite and chronic fatigue.  She has history of liver cirrhosis and GERD, followed by hepatology clinic at [facility].  She takes pantoprazole  40 mg twice daily for GERD.  She also reports history of gastroparesis, but chart review does not suggest history of DM.  She has been losing weight recently, which is unintentional, but later admits that she has been told to lose weight by hepatology clinic.  Denies any recent episode of fever, chills, chronic cough, hemoptysis.  Past Medical History:  Diagnosis Date   Abnormal Pap smear of cervix    Allergy    Anemia    Anxiety    Arthritis    Benign essential hypertension 06/14/2020   Cancer (HCC) 2016   Cervical   Cataract 2022   Chronic back pain    Chronic pelvic pain in female    COPD (chronic obstructive pulmonary disease) (HCC)    Depression    Fibromyalgia    GERD (gastroesophageal reflux disease) 1990   HPV in female    Hypothyroidism 02/25/2020   IBS (irritable bowel syndrome)    NASH (nonalcoholic steatohepatitis)    PTSD (post-traumatic stress disorder)    Sciatica of right side    Sleep apnea 2022    Past Surgical History:   Procedure Laterality Date   ABDOMINAL HYSTERECTOMY N/A 09/14/2014   Procedure: HYSTERECTOMY ABDOMINAL;  Surgeon: Vonn VEAR Inch, MD;  Location: [facility];  Service: Gynecology;  Laterality: N/A;   BIOPSY  08/29/2022   Procedure: BIOPSY;  Surgeon: Cindie Carlin POUR, DO;  Location: [facility];  Service: Endoscopy;;   BIOPSY  12/30/2022   Procedure: BIOPSY;  Surgeon: Cindie Carlin POUR, DO;  [facility]: [facility];  Service: Endoscopy;;   CESAREAN SECTION     CHOLECYSTECTOMY     COLONOSCOPY WITH PROPOFOL  N/A 08/29/2022   Surgeon: Cindie Carlin POUR, DO;  Normal exam.  Repeat in 10 years.   COLPOSCOPY VULVA W/ BIOPSY     ESOPHAGOGASTRODUODENOSCOPY (EGD) WITH PROPOFOL  N/A 08/29/2022   Surgeon: Cindie Carlin POUR, DO;   Severe reflux esophagitis biopsied, gastritis biopsied, nonbleeding gastric ulcer with no stigmata of bleeding, normal examined duodenum.  Gastric biopsies benign, no H. pylori.  Esophageal biopsies benign.   ESOPHAGOGASTRODUODENOSCOPY (EGD) WITH PROPOFOL  N/A 12/30/2022   Procedure: ESOPHAGOGASTRODUODENOSCOPY (EGD) WITH PROPOFOL ;  Surgeon: Cindie Carlin POUR, DO;  [facility]: [facility];  Service: Endoscopy;  Laterality: N/A;  9:45 am, asa 3   EXCISIONAL HEMORRHOIDECTOMY     FRACTURE SURGERY  2022   Right forearm   right forearm surgery Right  SALPINGOOPHORECTOMY Bilateral 09/14/2014   Procedure: SALPINGO OOPHORECTOMY;  Surgeon: Vonn VEAR Inch, MD;  Location: [facility];  Service: Gynecology;  Laterality: Bilateral;   TUBAL LIGATION      Family History  Problem Relation Age of Onset   Diabetes Mother    Heart attack Mother    COPD Mother    Anxiety disorder Mother    Arthritis Mother    Cancer Mother    Depression Mother    Hearing loss Mother    Kidney disease Mother    Miscarriages / [place] Mother    Alcohol abuse Father    ADD / ADHD Brother    Depression Brother    Drug abuse Brother    Cirrhosis Maternal Grandfather        ETOH   Colon cancer Maternal  Grandfather    Learning disabilities Son     Social History   Socioeconomic History   Marital status: Married    Spouse name: Not on file   Number of children: Not on file   Years of education: Not on file   Highest education level: GED or equivalent  Occupational History   Not on file  Tobacco Use   Smoking status: Every Day    Current packs/day: 2.00    Average packs/day: 2.0 packs/day for 40.6 years (81.1 ttl pk-yrs)    Types: Cigarettes    Start date: 1985   Smokeless tobacco: Never  Vaping Use   Vaping status: Never Used  Substance and Sexual Activity   Alcohol use: Not Currently    Comment: Never a heavy drinker   Drug use: Not Currently    Types: Marijuana    Comment: occassional use -last used months ago.   Sexual activity: Yes    Birth control/protection: Surgical  Other Topics Concern   Not on file  Social History Narrative   Not on file   Social Drivers of Health   Financial Resource Strain: Medium Risk (12/09/2023)   Overall Financial Resource Strain (CARDIA)    Difficulty of Paying Living Expenses: Somewhat hard  Food Insecurity: Food Insecurity Present (12/09/2023)   Hunger Vital Sign    Worried About Running Out of Food in the Last Year: Sometimes true    Ran Out of Food in the Last Year: Sometimes true  Transportation Needs: No Transportation Needs (12/09/2023)   PRAPARE - Administrator, Civil Service (Medical): No    Lack of Transportation (Non-Medical): No  Physical Activity: Insufficiently Active (12/09/2023)   Exercise Vital Sign    Days of Exercise per Week: 3 days    Minutes of Exercise per Session: 10 min  Stress: Stress Concern Present (12/09/2023)   [employer] of Occupational Health - Occupational Stress Questionnaire    Feeling of Stress: To some extent  Social Connections: Moderately Isolated (12/09/2023)   Social Connection and Isolation Panel    Frequency of Communication with Friends and Family: More than three  times a week    Frequency of Social Gatherings with Friends and Family: Once a week    Attends Religious Services: More than 4 times per year    Active Member of [employer] or Organizations: No    Attends Banker Meetings: Never    Marital Status: Separated  Intimate Partner Violence: Not At Risk (12/16/2023)   Humiliation, Afraid, Rape, and Kick questionnaire    Fear of Current or Ex-Partner: No    Emotionally Abused: No    Physically Abused: No  Sexually Abused: No    Outpatient Medications Prior to Visit  Medication Sig Dispense Refill   benzonatate  (TESSALON ) 200 MG capsule Take 1 capsule (200 mg total) by mouth 3 (three) times daily as needed. Swallow whole, do not chew 21 capsule 0   DULoxetine  (CYMBALTA ) 30 MG capsule Take 2 capsules by mouth once daily 180 capsule 0   pantoprazole  (PROTONIX ) 40 MG tablet Take 1 tablet by mouth twice daily 60 tablet 5   rosuvastatin  (CRESTOR ) 5 MG tablet Take 1 tablet (5 mg total) by mouth daily. 90 tablet 3   traZODone  (DESYREL ) 150 MG tablet Take 1-2 tablets (150-300 mg total) by mouth at bedtime. 90 tablet 3   ursodiol (ACTIGALL) 300 MG capsule Take 300 mg by mouth 3 (three) times daily.     cholestyramine (QUESTRAN) 4 g packet Take 4 g by mouth 2 (two) times daily as needed.     doxycycline  (VIBRAMYCIN ) 100 MG capsule Take 1 capsule (100 mg total) by mouth 2 (two) times daily. 20 capsule 0   No facility-administered medications prior to visit.    Allergies  Allergen Reactions   Dulcolax [Bisacodyl ] Nausea And Vomiting   Latex Other (See Comments)    Unknown    Flagyl [Metronidazole] Itching   Lithium Itching    Review of Systems  Constitutional:  Positive for fatigue. Negative for chills and fever.  HENT:  Negative for congestion and sore throat.   Eyes:  Negative for pain and discharge.  Respiratory:  Negative for cough and shortness of breath.   Cardiovascular:  Negative for chest pain and palpitations.   Gastrointestinal:  Negative for diarrhea and vomiting.  Endocrine: Negative for polydipsia and polyuria.  Genitourinary:  Negative for dysuria and hematuria.  Musculoskeletal:  Negative for neck pain and neck stiffness.  Skin:  Negative for rash.  Neurological:  Negative for dizziness and syncope.  Psychiatric/Behavioral:  Negative for agitation and behavioral problems.        Objective:    Physical Exam Vitals reviewed.  Constitutional:      General: She is not in acute distress.    Appearance: She is not diaphoretic.  HENT:     Head: Normocephalic and atraumatic.     Nose: Nose normal.     Mouth/Throat:     Mouth: Mucous membranes are moist.  Eyes:     General: No scleral icterus.    Extraocular Movements: Extraocular movements intact.  Cardiovascular:     Rate and Rhythm: Normal rate and regular rhythm.     Heart sounds: Normal heart sounds. No murmur heard. Pulmonary:     Breath sounds: Normal breath sounds. No wheezing or rales.  Musculoskeletal:     Cervical back: Neck supple. No tenderness.     Right lower leg: No edema.     Left lower leg: No edema.  Skin:    General: Skin is warm.     Findings: No rash.  Neurological:     General: No focal deficit present.     Mental Status: She is alert and oriented to person, place, and time.  Psychiatric:        Mood and Affect: Mood normal.        Behavior: Behavior normal.     BP 127/77   Pulse 77   Ht 5' 5 (1.651 m)   Wt 183 lb 6.4 oz (83.2 kg)   LMP 08/19/2014   SpO2 94%   BMI 30.52 kg/m  Wt Readings from Last 3  Encounters:  12/18/23 183 lb 6.4 oz (83.2 kg)  12/16/23 185 lb (83.9 kg)  10/06/23 183 lb (83 kg)        Assessment & Plan:   Problem List Items Addressed This Visit       Digestive   Gastroesophageal reflux disease   On pantoprazole  40 mg BID Followed by GI Also reports history of gastroparesis, needs to follow small, frequent meals      Metabolic dysfunction-associated fatty liver  disease (MAFLD)   Followed by hepatology clinic at [facility] On ursodiol for itching         Other   Fibromyalgia   Has chronic myalgias On Cymbalta  for GAD, can also help with fibromyalgia On trazodone  for insomnia      Fatigue - Primary   Reports chronic fatigue Likely multifactorial, including related to fibromyalgia, dehydration and/or electrolyte deficiency Check CBC, CMP and TSH Advised to maintain adequate hydration and eat at regular intervals      Relevant Orders   CBC with Differential/Platelet   CMP14+EGFR   TSH + free T4   Prediabetes   Lab Results  Component Value Date   HGBA1C 6.2 (H) 11/07/2022   Recheck HbA1c Advised to follow low-carb diet      Relevant Orders   CMP14+EGFR   Hemoglobin A1c   Leg cramps   Could be related to electrolyte deficiency, especially hypokalemia Advised to try mustard or tonic water Check CMP        No orders of the defined types were placed in this encounter.    Suzzane MARLA Blanch, MD

## 2023-12-18 NOTE — Assessment & Plan Note (Signed)
 On pantoprazole  40 mg BID Followed by GI Also reports history of gastroparesis, needs to follow small, frequent meals

## 2023-12-18 NOTE — Assessment & Plan Note (Signed)
 Followed by hepatology clinic at [facility] On ursodiol for itching

## 2023-12-18 NOTE — Telephone Encounter (Signed)
 That's fine

## 2023-12-19 ENCOUNTER — Ambulatory Visit: Payer: Self-pay | Admitting: Internal Medicine

## 2023-12-19 LAB — TSH+FREE T4
Free T4: 0.97 ng/dL (ref 0.82–1.77)
TSH: 2.75 u[IU]/mL (ref 0.450–4.500)

## 2023-12-19 LAB — CMP14+EGFR
ALT: 14 IU/L (ref 0–32)
AST: 17 IU/L (ref 0–40)
Albumin: 4 g/dL (ref 3.8–4.9)
Alkaline Phosphatase: 259 IU/L — ABNORMAL HIGH (ref 44–121)
BUN/Creatinine Ratio: 11 (ref 9–23)
BUN: 12 mg/dL (ref 6–24)
Bilirubin Total: 0.6 mg/dL (ref 0.0–1.2)
CO2: 23 mmol/L (ref 20–29)
Calcium: 9.6 mg/dL (ref 8.7–10.2)
Chloride: 101 mmol/L (ref 96–106)
Creatinine, Ser: 1.06 mg/dL — ABNORMAL HIGH (ref 0.57–1.00)
Globulin, Total: 3.3 g/dL (ref 1.5–4.5)
Glucose: 112 mg/dL — ABNORMAL HIGH (ref 70–99)
Potassium: 4.4 mmol/L (ref 3.5–5.2)
Sodium: 139 mmol/L (ref 134–144)
Total Protein: 7.3 g/dL (ref 6.0–8.5)
eGFR: 62 mL/min/1.73 (ref >59)

## 2023-12-19 LAB — CBC WITH DIFFERENTIAL/PLATELET
Basophils Absolute: 0.1 x10E3/uL (ref 0.0–0.2)
Basos: 1 %
EOS (ABSOLUTE): 0.1 x10E3/uL (ref 0.0–0.4)
Eos: 2 %
Hematocrit: 41.4 % (ref 34.0–46.6)
Hemoglobin: 13.2 g/dL (ref 11.1–15.9)
Immature Grans (Abs): 0 x10E3/uL (ref 0.0–0.1)
Immature Granulocytes: 0 %
Lymphocytes Absolute: 1.9 x10E3/uL (ref 0.7–3.1)
Lymphs: 28 %
MCH: 28.1 pg (ref 26.6–33.0)
MCHC: 31.9 g/dL (ref 31.5–35.7)
MCV: 88 fL (ref 79–97)
Monocytes Absolute: 0.5 x10E3/uL (ref 0.1–0.9)
Monocytes: 8 %
Neutrophils Absolute: 4.3 x10E3/uL (ref 1.4–7.0)
Neutrophils: 61 %
Platelets: 369 x10E3/uL (ref 150–450)
RBC: 4.69 x10E6/uL (ref 3.77–5.28)
RDW: 14.3 % (ref 11.7–15.4)
WBC: 6.9 x10E3/uL (ref 3.4–10.8)

## 2023-12-19 LAB — HEMOGLOBIN A1C
Est. average glucose Bld gHb Est-mCnc: 126 mg/dL
Hgb A1c MFr Bld: 6 % — ABNORMAL HIGH (ref 4.8–5.6)

## 2023-12-19 NOTE — Telephone Encounter (Signed)
Pt scheduled with Laura.

## 2023-12-22 DIAGNOSIS — J449 Chronic obstructive pulmonary disease, unspecified: Secondary | ICD-10-CM | POA: Diagnosis not present

## 2023-12-23 NOTE — Progress Notes (Deleted)
 Referring Provider: Del Orbe Polanco, Ilian* Primary Care Physician:  Terry Wilhelmena Lloyd Hilario, FNP Primary GI Physician: Dr. Cindie  No chief complaint on file.   HPI:   Jennifer Rollins is a 53 y.o. female with history of anxiety, arthritis, cervical cancer, COPD, depression, fibromyalgia, IBS, constipation, GERD, reflux esophagitis, PUD, gastroparesis, chronic elevated alkaline phosphatase with extensive work-up detailed below, cirrhosis, following with [facility], presenting today for follow-up of abdominal distention, upper abdominal pain.     Last seen in the office 06/26/2023.  She had been experiencing increasing abdominal distention x 2 weeks with associated abdominal pain, flank pain, difficulty taking deep breaths.  On exam, her abdomen was full, but nontense.  Noted tenderness in the epigastric and LUQ region.  Etiology of her symptoms was not clear.  She denied postprandial bloating making gastroparesis or biliary less likely the etiology of her current symptoms. Planned to update abdominal ultrasound to rule out ascites as well as labs.  She was also counseled on gastroparesis diet.  Recommended hep A/B vaccine series due to cirrhosis.   Labs completed 06/26/2023 showing alk phos 259, creatinine 1.05.  CBC, lipase within normal limits.   Ultrasound 06/26/2023 showing cirrhosis with no focal liver lesion.  No presence of ascites mention.   Recommended starting Reglan  5 mg 3 times daily. This was sent to her pharmacy on 07/17/23.    In the interim:  Patinet had follow-up with Stephane Quest, NP and was prescribed ursodiol and cholestyramine.    Today:    Abdominal pain:     Last EGD 12/30/22: - Normal esophagus.  - Gastritis. Biopsied.  - Normal duodenal bulb, first portion of the duodenum and second portion of the duodenum.  RUQ ultrasound 10/06/2023: Prior cholecystectomy, normal CBD, minimal liver nodularity.   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  Comment states differential includes nonhepatic tropic viral infection, drug or toxin induced liver injury, exposure to chemicals or solvents.   MRI abdomen with and without contrast 08/17/2021: Mildly enlarged liver, portions of the liver capsule slightly nodular, cannot exclude early changes of cirrhosis.  Normal spleen size.  No ascites.  No adenopathy.  No suspicious enhancing liver mass.  Variant cecal anatomy (located in the midline)  MRI abdomen with and without contrast 03/29/2023 with hepatomegaly, mild hepatic steatosis, mildly coarse contour of the liver with no suspicious liver lesion, multiple prominent porta hepatitis and portacaval lymph nodes, likely reactive.   Labs in 2024: Hepatitis A, B, and C labs were negative.  Recommended vaccination for hepatitis A and B. ANA, AMA, ASMA, IgG, IgM all within normal limits.  Mild elevation of IgA at 555. Alk phos 244 with liver fraction 70%, bone fraction 30%. anti-SP 100 and anti-GP210 which were negative    Abdominal ultrasound with elastography is 08/26/2022: Increased hepatic echogenicity, no definite nodularity, normal spleen, median K PA 4.3.  Past Medical History:  Diagnosis Date   Abnormal Pap smear of cervix    Allergy    Anemia    Anxiety    Arthritis    Benign essential hypertension 06/14/2020   Cancer (HCC) 2016   Cervical   Cataract 2022   Chronic back pain    Chronic pelvic pain in female  COPD (chronic obstructive pulmonary disease) (HCC)    Depression    Fibromyalgia    GERD (gastroesophageal reflux disease) 1990   HPV in female    Hypothyroidism 02/25/2020   IBS (irritable bowel syndrome)    NASH (nonalcoholic steatohepatitis)    PTSD (post-traumatic stress disorder)     Sciatica of right side    Sleep apnea 2022    Past Surgical History:  Procedure Laterality Date   ABDOMINAL HYSTERECTOMY N/A 09/14/2014   Procedure: HYSTERECTOMY ABDOMINAL;  Surgeon: Vonn VEAR Inch, MD;  Location: [facility];  Service: Gynecology;  Laterality: N/A;   BIOPSY  08/29/2022   Procedure: BIOPSY;  Surgeon: Cindie Carlin POUR, DO;  [facility];  Service: Endoscopy;;   BIOPSY  12/30/2022   Procedure: BIOPSY;  Surgeon: Cindie Carlin POUR, DO;  [facility];  Service: Endoscopy;;   CESAREAN SECTION     CHOLECYSTECTOMY     COLONOSCOPY WITH PROPOFOL  N/A 08/29/2022   Surgeon: Cindie Carlin POUR, DO;  Normal exam.  Repeat in 10 years.   COLPOSCOPY VULVA W/ BIOPSY     ESOPHAGOGASTRODUODENOSCOPY (EGD) WITH PROPOFOL  N/A 08/29/2022   Surgeon: Cindie Carlin POUR, DO;   Severe reflux esophagitis biopsied, gastritis biopsied, nonbleeding gastric ulcer with no stigmata of bleeding, normal examined duodenum.  Gastric biopsies benign, no H. pylori.  Esophageal biopsies benign.   ESOPHAGOGASTRODUODENOSCOPY (EGD) WITH PROPOFOL  N/A 12/30/2022   Procedure: ESOPHAGOGASTRODUODENOSCOPY (EGD) WITH PROPOFOL ;  Surgeon: Cindie Carlin POUR, DO;  [facility];  Service: Endoscopy;  Laterality: N/A;  9:45 am, asa 3   EXCISIONAL HEMORRHOIDECTOMY     FRACTURE SURGERY  2022   Right forearm   right forearm surgery Right    SALPINGOOPHORECTOMY Bilateral 09/14/2014   Procedure: SALPINGO OOPHORECTOMY;  Surgeon: Vonn VEAR Inch, MD;  Location: [facility];  Service: Gynecology;  Laterality: Bilateral;   TUBAL LIGATION      Current Outpatient Medications  Medication Sig Dispense Refill   benzonatate  (TESSALON ) 200 MG capsule Take 1 capsule (200 mg total) by mouth 3 (three) times daily as needed. Swallow whole, do not chew 21 capsule 0   DULoxetine  (CYMBALTA ) 30 MG capsule Take 2 capsules by mouth once daily 180 capsule 0   pantoprazole  (PROTONIX ) 40 MG tablet Take 1 tablet by mouth twice daily 60  tablet 5   rosuvastatin  (CRESTOR ) 5 MG tablet Take 1 tablet (5 mg total) by mouth daily. 90 tablet 3   traZODone  (DESYREL ) 150 MG tablet Take 1-2 tablets (150-300 mg total) by mouth at bedtime. 90 tablet 3   ursodiol (ACTIGALL) 300 MG capsule Take 300 mg by mouth 3 (three) times daily.     No current facility-administered medications for this visit.    Allergies as of 12/25/2023 - Review Complete 12/18/2023  Allergen Reaction Noted   Dulcolax [bisacodyl ] Nausea And Vomiting 08/27/2022   Latex Other (See Comments) 02/08/2013   Flagyl [metronidazole] Itching 01/15/2015   Lithium Itching 08/01/2022    Family History  Problem Relation Age of Onset   Diabetes Mother    Heart attack Mother    COPD Mother    Anxiety disorder Mother    Arthritis Mother    Cancer Mother    Depression Mother    Hearing loss Mother    Kidney disease Mother    Miscarriages / [place] Mother    Alcohol abuse Father    ADD / ADHD Brother    Depression Brother    Drug abuse Brother  Cirrhosis Maternal Grandfather        ETOH   Colon cancer Maternal Grandfather    Learning disabilities Son     Social History   Socioeconomic History   Marital status: Married    Spouse name: Not on file   Number of children: Not on file   Years of education: Not on file   Highest education level: GED or equivalent  Occupational History   Not on file  Tobacco Use   Smoking status: Every Day    Current packs/day: 2.00    Average packs/day: 2.0 packs/day for 40.6 years (81.1 ttl pk-yrs)    Types: Cigarettes    Start date: 70   Smokeless tobacco: Never  Vaping Use   Vaping status: Never Used  Substance and Sexual Activity   Alcohol use: Not Currently    Comment: Never a heavy drinker   Drug use: Not Currently    Types: Marijuana    Comment: occassional use -last used months ago.   Sexual activity: Yes    Birth control/protection: Surgical  Other Topics Concern   Not on file  Social History  Narrative   Not on file   Social Drivers of Health   Financial Resource Strain: Medium Risk (12/09/2023)   Overall Financial Resource Strain (CARDIA)    Difficulty of Paying Living Expenses: Somewhat hard  Food Insecurity: Food Insecurity Present (12/09/2023)   Hunger Vital Sign    Worried About Running Out of Food in the Last Year: Sometimes true    Ran Out of Food in the Last Year: Sometimes true  Transportation Needs: No Transportation Needs (12/09/2023)   PRAPARE - Administrator, Civil Service (Medical): No    Lack of Transportation (Non-Medical): No  Physical Activity: Insufficiently Active (12/09/2023)   Exercise Vital Sign    Days of Exercise per Week: 3 days    Minutes of Exercise per Session: 10 min  Stress: Stress Concern Present (12/09/2023)   [employer] of Occupational Health - Occupational Stress Questionnaire    Feeling of Stress: To some extent  Social Connections: Moderately Isolated (12/09/2023)   Social Connection and Isolation Panel    Frequency of Communication with Friends and Family: More than three times a week    Frequency of Social Gatherings with Friends and Family: Once a week    Attends Religious Services: More than 4 times per year    Active Member of [employer] or Organizations: No    Attends Banker Meetings: Never    Marital Status: Separated    Review of Systems: Gen: Denies fever, chills, anorexia. Denies fatigue, weakness, weight loss.  CV: Denies chest pain, palpitations, syncope, peripheral edema, and claudication. Resp: Denies dyspnea at rest, cough, wheezing, coughing up blood, and pleurisy. GI: Denies vomiting blood, jaundice, and fecal incontinence.   Denies dysphagia or odynophagia. Derm: Denies rash, itching, dry skin Psych: Denies depression, anxiety, memory loss, confusion. No homicidal or suicidal ideation.  Heme: Denies bruising, bleeding, and enlarged lymph nodes.  Physical Exam: LMP 08/19/2014  General:    Alert and oriented. No distress noted. Pleasant and cooperative.  Head:  Normocephalic and atraumatic. Eyes:  Conjuctiva clear without scleral icterus. Heart:  S1, S2 present without murmurs appreciated. Lungs:  Clear to auscultation bilaterally. No wheezes, rales, or rhonchi. No distress.  Abdomen:  +BS, soft, non-tender and non-distended. No rebound or guarding. No HSM or masses noted. Msk:  Symmetrical without gross deformities. Normal posture. Extremities:  Without edema. Neurologic:  Alert and  oriented x4 Psych:  Normal mood and affect.    Assessment:     Plan:  ***   Josette Centers, PA-C [facility] 12/25/2023

## 2023-12-25 ENCOUNTER — Ambulatory Visit: Admitting: Gastroenterology

## 2023-12-25 ENCOUNTER — Encounter: Payer: Self-pay | Admitting: Gastroenterology

## 2024-01-21 ENCOUNTER — Ambulatory Visit: Admitting: Family Medicine

## 2024-01-21 ENCOUNTER — Ambulatory Visit

## 2024-01-21 ENCOUNTER — Other Ambulatory Visit: Payer: Self-pay | Admitting: Family Medicine

## 2024-01-22 DIAGNOSIS — J449 Chronic obstructive pulmonary disease, unspecified: Secondary | ICD-10-CM | POA: Diagnosis not present

## 2024-02-03 DIAGNOSIS — H353131 Nonexudative age-related macular degeneration, bilateral, early dry stage: Secondary | ICD-10-CM | POA: Diagnosis not present

## 2024-02-03 DIAGNOSIS — H2513 Age-related nuclear cataract, bilateral: Secondary | ICD-10-CM | POA: Diagnosis not present

## 2024-02-13 DIAGNOSIS — H25811 Combined forms of age-related cataract, right eye: Secondary | ICD-10-CM | POA: Diagnosis not present

## 2024-02-16 ENCOUNTER — Ambulatory Visit (HOSPITAL_COMMUNITY)

## 2024-02-27 DIAGNOSIS — H25812 Combined forms of age-related cataract, left eye: Secondary | ICD-10-CM | POA: Diagnosis not present

## 2024-03-09 ENCOUNTER — Encounter: Payer: Self-pay | Admitting: *Deleted

## 2024-03-10 ENCOUNTER — Ambulatory Visit (INDEPENDENT_AMBULATORY_CARE_PROVIDER_SITE_OTHER): Admitting: Gastroenterology

## 2024-03-10 ENCOUNTER — Telehealth (INDEPENDENT_AMBULATORY_CARE_PROVIDER_SITE_OTHER): Payer: Self-pay | Admitting: Gastroenterology

## 2024-03-10 ENCOUNTER — Encounter: Payer: Self-pay | Admitting: Gastroenterology

## 2024-03-10 ENCOUNTER — Telehealth (INDEPENDENT_AMBULATORY_CARE_PROVIDER_SITE_OTHER): Payer: Self-pay

## 2024-03-10 ENCOUNTER — Other Ambulatory Visit (INDEPENDENT_AMBULATORY_CARE_PROVIDER_SITE_OTHER): Payer: Self-pay

## 2024-03-10 VITALS — BP 123/75 | HR 65 | Temp 97.1°F | Ht 65.0 in | Wt 185.2 lb

## 2024-03-10 DIAGNOSIS — R1010 Upper abdominal pain, unspecified: Secondary | ICD-10-CM

## 2024-03-10 DIAGNOSIS — K3184 Gastroparesis: Secondary | ICD-10-CM | POA: Diagnosis not present

## 2024-03-10 DIAGNOSIS — K5900 Constipation, unspecified: Secondary | ICD-10-CM

## 2024-03-10 DIAGNOSIS — K7469 Other cirrhosis of liver: Secondary | ICD-10-CM

## 2024-03-10 DIAGNOSIS — M549 Dorsalgia, unspecified: Secondary | ICD-10-CM | POA: Diagnosis not present

## 2024-03-10 DIAGNOSIS — K7460 Unspecified cirrhosis of liver: Secondary | ICD-10-CM

## 2024-03-10 MED ORDER — LINACLOTIDE 290 MCG PO CAPS: 290.0000 ug | ORAL_CAPSULE | Freq: Every day | ORAL | Status: AC

## 2024-03-10 MED ORDER — PANTOPRAZOLE SODIUM 40 MG PO TBEC: 40.0000 mg | DELAYED_RELEASE_TABLET | Freq: Two times a day (BID) | ORAL | 3 refills | Status: AC

## 2024-03-10 NOTE — Patient Instructions (Signed)
 Please have blood work today. We will be ordering an ultrasound for routine liver purposes. However, if any of the labs are abnormal or your abdominal pain worsens, we will do a stat CT.  I feel the majority of your symptoms are coming from constipation. We need to reset before trying another therapy for constipation.  Today for bowel purge: take 1 capful of MIRALAX in 8 ounces of water every hour on the hour for 6 doses. You may also take a dulcolax suppository to get things going.  Once purged out, I want you to start the highest dose of Linzess . You will take this starting tomorrow on an empty stomach, 30 minutes before eating. Linzess  works best when taken once a day every day, on an empty stomach, at least 30 minutes before your first meal of the day.  When Linzess  is taken daily as directed:  *Constipation relief is typically felt in about a week *IBS-C patients may begin to experience relief from belly pain and overall abdominal symptoms (pain, discomfort, and bloating) in about 1 week,   with symptoms typically improving over 12 weeks.  Diarrhea may occur in the first 2 weeks -keep taking it.  The diarrhea should go away and you should start having normal, complete, full bowel movements. It may be helpful to start treatment when you can be near the comfort of your own bathroom, such as a weekend.    IF you do not have improvement in the next few days/week, please send mychart message. We will need to do a different medication or imaging.  If you have vomiting, severe abdominal pain, or any other concerns, please go to the emergency room.  I will see you back in 6 weeks for close follow-up. Please keep upcoming appt with Hepatology!  It was a pleasure to see you today. I want to create trusting relationships with patients and provide genuine, compassionate, and quality care. I truly value your feedback, so please be on the lookout for a survey regarding your visit with me today. I  appreciate your time in completing this!         Therisa MICAEL Stager, PhD, ANP-BC [facility]

## 2024-03-10 NOTE — Progress Notes (Signed)
 Gastroenterology Office Note     Primary Care Physician:  Terry Wilhelmena Lloyd Hilario, FNP  Primary Gastroenterologist: Dr.  Cindie   Chief Complaint   Chief Complaint  Patient presents with   Constipation    Pt arrives for refills and constipation. Has not had BM in 5 days. Last BM was not normal. Pt reports bloating, a little nausea. Took a Fleet suppository but no help. Lower back pain near kidneys for about one week. Pt states she is needing Linzess  refilled. Refill on Protonix  BID. Pea size possible scar tissue inside rectum on left side.      History of Present Illness   Jennifer Rollins is a 53 y.o. female presenting today with a history of anxiety, arthritis, cervical cancer, COPD, depression, fibromyalgia, IBS, constipation, GERD, reflux esophagitis, PUD, gastroparesis, chronic elevated alkaline phosphatase with extensive work-up detailed below, dealing with constipation.   Has upcoming appt with Stephane Quest, NP, next month. Quit urso in the past as had swelling in lower extremity. Doesn't like to take medications unless absolutely needed. States lower extremities are starting to intermittently swell. Will have lower pedal/ankle edema at end of day. Urinating twice a day. Sometimes urge urinary incontinence. Urine is dark. Drinks mello yellow during day, occasional water.    Fleet suppository last night without results. Felt pea-sized area in left side of rectum. Feels bloated. Has pressure in upper abdomen. 3 days ago very small, fluffy pieces. Has been having a BM once per week for awhile now, at least since earlier this year. She has tried Linzess  145 with bloating. Dulcolax causes vomiting. Has watery stool with linzess . Was only taking when needed, not on a daily basis because had bloating so bad. Has to strain to pass gas but has flatus. Every morning feels nauseated, which is chronic  Has left upper back pain for past week. Pain feels constant. No heavy lifting. No worsening  after eating. Nothing relieves. Had to start sleeping in recliner  She doesn't believe she has tried Reglan . Not interested in this due to side effects. Full all the time.   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 States she struggles to find the word for what she is looking at while talking. Foggy headed at times.     Delayed gastric emptying  Gastric emptying study 03/13/2023 with 77% emptied at 4 hours (delayed).  Recommended following strict gastroparesis diet and taking PPI twice daily.   Office visit with [facility] 03/21/2023.  Recommended checking anti-SP 100 and anti-GP210 which were negative, MRI/MRCP with suggestion of cirrhosis, multiple prominent porta hepatis and portacaval lymph nodes, likely reactive, no biliary duct dilation. Per Stephane Quest, NP recommended erring on the side of caution and screening for liver cancer every 6 months. Last seen April 2025.   Last GI procedures:   EGD 12/30/2022 with normal esophagus, gastritis biopsied.  Pathology with reactive gastropathy, minimal chronic gastritis.  Negative for H. pylori.  Recommended continuing current medications, Protonix  40 mg daily.    Last colonoscopy in April 2024 with normal exam.    Prior workup of elevated alkaline phosphatase:         Liver biopsy 04/18/2021: Mild to moderate portal chronic inflammation with interface hepatitis (grade 2 of 4), no significant lobular inflammation, borderline mild portal fibrosis without cirrhosis (stage I of 4), negative for steatosis.  Comment states differential includes nonhepatic tropic viral infection, drug or toxin induced liver injury, exposure to chemicals or solvents.   MRI abdomen  with and without contrast 08/17/2021: Mildly enlarged liver, portions of the liver capsule slightly nodular, cannot exclude early changes of cirrhosis.  Normal spleen size.  No ascites.  No adenopathy.  No suspicious enhancing liver mass.  Variant cecal anatomy (located in the midline)   Labs  08/12/2022: Hepatitis A, B, and C labs were negative.  Recommended vaccination for hepatitis A and B. ANA, AMA, ASMA, IgG, IgM all within normal limits.  Mild elevation of IgA at 555. Alk phos 244 with liver fraction 70%, bone fraction 30%.     Abdominal ultrasound with elastography is 08/26/2022: Increased hepatic echogenicity, no definite nodularity, normal spleen, median K PA 4.3.  Past Medical History:  Diagnosis Date   Abnormal Pap smear of cervix    Allergy    Anemia    Anxiety    Arthritis    Benign essential hypertension 06/14/2020   Cancer (HCC) 2016   Cervical   Cataract 2022   Chronic back pain    Chronic pelvic pain in female    COPD (chronic obstructive pulmonary disease) (HCC)    Depression    Fibromyalgia    GERD (gastroesophageal reflux disease) 1990   HPV in female    Hypothyroidism 02/25/2020   IBS (irritable bowel syndrome)    NASH (nonalcoholic steatohepatitis)    PTSD (post-traumatic stress disorder)    Sciatica of right side    Sleep apnea 2022    Past Surgical History:  Procedure Laterality Date   ABDOMINAL HYSTERECTOMY N/A 09/14/2014   Procedure: HYSTERECTOMY ABDOMINAL;  Surgeon: Vonn VEAR Inch, MD;  Location: [facility];  Service: Gynecology;  Laterality: N/A;   BIOPSY  08/29/2022   Procedure: BIOPSY;  Surgeon: Cindie Carlin POUR, DO;  Location: [facility];  Service: Endoscopy;;   BIOPSY  12/30/2022   Procedure: BIOPSY;  Surgeon: Cindie Carlin POUR, DO;  Location: [facility];  Service: Endoscopy;;   CESAREAN SECTION     CHOLECYSTECTOMY     COLONOSCOPY WITH PROPOFOL  N/A 08/29/2022   Surgeon: Cindie Carlin POUR, DO;  Normal exam.  Repeat in 10 years.   COLPOSCOPY VULVA W/ BIOPSY     ESOPHAGOGASTRODUODENOSCOPY (EGD) WITH PROPOFOL  N/A 08/29/2022   Surgeon: Cindie Carlin POUR, DO;   Severe reflux esophagitis biopsied, gastritis biopsied, nonbleeding gastric ulcer with no stigmata of bleeding, normal examined duodenum.  Gastric biopsies benign, no H. pylori.   Esophageal biopsies benign.   ESOPHAGOGASTRODUODENOSCOPY (EGD) WITH PROPOFOL  N/A 12/30/2022   Procedure: ESOPHAGOGASTRODUODENOSCOPY (EGD) WITH PROPOFOL ;  Surgeon: Cindie Carlin POUR, DO;  Location: [facility];  Service: Endoscopy;  Laterality: N/A;  9:45 am, asa 3   EXCISIONAL HEMORRHOIDECTOMY     FRACTURE SURGERY  2022   Right forearm   right forearm surgery Right    SALPINGOOPHORECTOMY Bilateral 09/14/2014   Procedure: SALPINGO OOPHORECTOMY;  Surgeon: Vonn VEAR Inch, MD;  Location: [facility];  Service: Gynecology;  Laterality: Bilateral;   TUBAL LIGATION      Current Outpatient Medications  Medication Sig Dispense Refill   DULoxetine  (CYMBALTA ) 30 MG capsule Take 2 capsules by mouth once daily 180 capsule 0   pantoprazole  (PROTONIX ) 40 MG tablet Take 1 tablet by mouth twice daily 60 tablet 5   rosuvastatin  (CRESTOR ) 5 MG tablet Take 1 tablet (5 mg total) by mouth daily. 90 tablet 3   traZODone  (DESYREL ) 150 MG tablet Take 1-2 tablets (150-300 mg total) by mouth at bedtime. 90 tablet 3   No current facility-administered medications for this visit.  Allergies as of 03/10/2024 - Review Complete 03/10/2024  Allergen Reaction Noted   Dulcolax [bisacodyl ] Nausea And Vomiting 08/27/2022   Latex Other (See Comments) 02/08/2013   Flagyl [metronidazole] Itching 01/15/2015   Lithium Itching 08/01/2022    Family History  Problem Relation Age of Onset   Diabetes Mother    Heart attack Mother    COPD Mother    Anxiety disorder Mother    Arthritis Mother    Cancer Mother    Depression Mother    Hearing loss Mother    Kidney disease Mother    Miscarriages / Stillbirths Mother    Alcohol abuse Father    ADD / ADHD Brother    Depression Brother    Drug abuse Brother    Cirrhosis Maternal Grandfather        ETOH   Colon cancer Maternal Grandfather    Learning disabilities Son     Social History   Socioeconomic History   Marital status: Married    Spouse name: Not on file    Number of children: Not on file   Years of education: Not on file   Highest education level: GED or equivalent  Occupational History   Not on file  Tobacco Use   Smoking status: Every Day    Current packs/day: 2.00    Average packs/day: 2.0 packs/day for 40.8 years (81.6 ttl pk-yrs)    Types: Cigarettes    Start date: 1985   Smokeless tobacco: Never  Vaping Use   Vaping status: Never Used  Substance and Sexual Activity   Alcohol use: Not Currently    Comment: Never a heavy drinker   Drug use: Not Currently    Types: Marijuana    Comment: occassional use -last used months ago.   Sexual activity: Yes    Birth control/protection: Surgical  Other Topics Concern   Not on file  Social History Narrative   Not on file   Social Drivers of Health   Financial Resource Strain: Medium Risk (12/09/2023)   Overall Financial Resource Strain (CARDIA)    Difficulty of Paying Living Expenses: Somewhat hard  Food Insecurity: Food Insecurity Present (12/09/2023)   Hunger Vital Sign    Worried About Running Out of Food in the Last Year: Sometimes true    Ran Out of Food in the Last Year: Sometimes true  Transportation Needs: No Transportation Needs (12/09/2023)   PRAPARE - Administrator, Civil Service (Medical): No    Lack of Transportation (Non-Medical): No  Physical Activity: Insufficiently Active (12/09/2023)   Exercise Vital Sign    Days of Exercise per Week: 3 days    Minutes of Exercise per Session: 10 min  Stress: Stress Concern Present (12/09/2023)   Jennifer Rollins [employer] Occupational [employer] - Occupational Stress Questionnaire    Feeling of Stress: To some extent  Social Connections: Moderately Isolated (12/09/2023)   Social Connection and Isolation Panel    Frequency of Communication with Friends and Family: More than three times a week    Frequency of Social Gatherings with Friends and Family: Once a week    Attends Religious Services: More than 4 times per year     Active Member of [employer] or Organizations: No    Attends Banker Meetings: Never    Marital Status: Separated  Intimate Partner Violence: Not At Risk (12/16/2023)   Humiliation, Afraid, Rape, and Kick questionnaire    Fear of Current or Ex-Partner: No    Emotionally Abused: No  Physically Abused: No    Sexually Abused: No     Review of Systems   Gen: Denies any fever, chills, fatigue, weight loss, lack of appetite.  CV: Denies chest pain, heart palpitations, peripheral edema, syncope.  Resp: Denies shortness of breath at rest or with exertion. Denies wheezing or cough.  GI: see hPI GU : Denies urinary burning, urinary frequency, urinary hesitancy MS: see HPI Derm: Denies rash, itching, dry skin Psych: Denies depression, anxiety, memory loss, and confusion Heme: Denies bruising, bleeding, and enlarged lymph nodes.   Physical Exam   BP 123/75   Pulse 65   Temp (!) 97.1 F (36.2 C)   Ht 5' 5 (1.651 m)   Wt 185 lb 3.2 oz (84 kg)   LMP 08/19/2014   BMI 30.82 kg/m  General:   Alert and oriented. Flat affect Head:  Normocephalic and atraumatic. Eyes:  Without icterus Abdomen:  +BS, soft,TTP LUQ, non-distended, no rebound or guarding Rectal:  no external hemorrhoids, no mass or impaction on DRE, full lateral internal hemorrhoid column, no bleeding Msk:  Symmetrical without gross deformities. Normal posture. Extremities:  Without edema. Neurologic:  Alert and  oriented x4 Skin:  Intact without significant lesions or rashes.    Assessment   Jennifer Rollins is a 53 y.o. female presenting today with a history of anxiety, arthritis, cervical cancer, COPD, depression, fibromyalgia, IBS, constipation, GERD, reflux esophagitis, PUD, gastroparesis, chronic elevated alkaline phosphatase with extensive work-up and followed by Stephane Quest, NP at [facility], now with worsening constipation and noting abdominal pain.   LUQ abdominal pain, left back pain, bloating: suspect  multifactorial in setting of worsened constipation, back pain is ikely musculoskeletal, no postprandial symptoms and doubt pancreatitis. EGD fairly recent as of last year along with colonoscopy. Linzess  has caused bloating but has not taken it consistently to really gauge full effect. Will increase this to 290 mcg daily and may need to trial a different agent. Updating MELD labs including lipase. May need CT imaging depending on labs. Bowel purge today and then start Linzess .    Gastroparesis: continue with dietary measures and PPI right now. Would avoid Reglan  unless unable to tolerate diet; patient also would like to avoid due to potential side effects  Concern for Cirrhosis: with elevated alk phos, potential etiology including PBC, genetic abnormalities that increase bile acid retention, possible MASH, the abnormal gene for alpha 1 antitrypsin deficiency was present but unlikely to be culprit for [facility], no parasitic infection, empirically started on Urso in past but did not tolerate this. She will continue to folow with Dawn closely due to complicated scenario. She is enrolled in hepatoma screening.       PLAN   Miralax bowel purge now, then Trial linzess  290 mcg daily, close progress report, low threshold for CT PPI BID Extensive labs including MELD labs, UA with culture, TSH, AFP, lipase RUQ US  for hepatoma screening May need to trial Amitiza Close follow-up in 6 weeks, keep upcoming Hepatology visit    Therisa MICAEL Stager, PhD, ANP-BC [facility]

## 2024-03-10 NOTE — Telephone Encounter (Signed)
 Spoke with patient, gave her appt for RUQ US  on 10/23 at 8:15, npo midnight. Patient verbalized understanding.

## 2024-03-10 NOTE — Telephone Encounter (Signed)
 Medication Samples have been provided to the patient.  Drug name: Linzess         Strength: 290 mcg        Qty: 3 boxes  LOT: [id number]  Exp.Date: 05/26  Dosing instructions: take one by mouth daily  The patient has been instructed regarding the correct time, dose, and frequency of taking this medication, including desired effects and most common side effects.   Glenys Bruns 9:09 AM 03/10/2024

## 2024-03-11 LAB — UA/M W/RFLX CULTURE, ROUTINE
Bilirubin, UA: NEGATIVE
Glucose, UA: NEGATIVE
Ketones, UA: NEGATIVE
Leukocytes,UA: NEGATIVE
Nitrite, UA: NEGATIVE
Protein,UA: NEGATIVE
RBC, UA: NEGATIVE
Specific Gravity, UA: 1.015 (ref 1.005–1.030)
Urobilinogen, Ur: 1 mg/dL (ref 0.2–1.0)
pH, UA: 6.5 (ref 5.0–7.5)

## 2024-03-11 LAB — CBC WITH DIFFERENTIAL/PLATELET
Basophils Absolute: 0 x10E3/uL (ref 0.0–0.2)
Basos: 1 %
EOS (ABSOLUTE): 0.1 x10E3/uL (ref 0.0–0.4)
Eos: 2 %
Hematocrit: 43.8 % (ref 34.0–46.6)
Hemoglobin: 14 g/dL (ref 11.1–15.9)
Immature Grans (Abs): 0 x10E3/uL (ref 0.0–0.1)
Immature Granulocytes: 0 %
Lymphocytes Absolute: 1.6 x10E3/uL (ref 0.7–3.1)
Lymphs: 20 %
MCH: 27.9 pg (ref 26.6–33.0)
MCHC: 32 g/dL (ref 31.5–35.7)
MCV: 87 fL (ref 79–97)
Monocytes Absolute: 0.7 x10E3/uL (ref 0.1–0.9)
Monocytes: 9 %
Neutrophils Absolute: 5.4 x10E3/uL (ref 1.4–7.0)
Neutrophils: 68 %
Platelets: 379 x10E3/uL (ref 150–450)
RBC: 5.01 x10E6/uL (ref 3.77–5.28)
RDW: 13.3 % (ref 11.7–15.4)
WBC: 7.8 x10E3/uL (ref 3.4–10.8)

## 2024-03-11 LAB — TSH: TSH: 3.37 u[IU]/mL (ref 0.450–4.500)

## 2024-03-11 LAB — COMPREHENSIVE METABOLIC PANEL WITH GFR
ALT: 22 IU/L (ref 0–32)
AST: 23 IU/L (ref 0–40)
Albumin: 4.3 g/dL (ref 3.8–4.9)
Alkaline Phosphatase: 313 IU/L — ABNORMAL HIGH (ref 49–135)
BUN/Creatinine Ratio: 13 (ref 9–23)
BUN: 13 mg/dL (ref 6–24)
Bilirubin Total: 0.5 mg/dL (ref 0.0–1.2)
CO2: 24 mmol/L (ref 20–29)
Calcium: 9.8 mg/dL (ref 8.7–10.2)
Chloride: 100 mmol/L (ref 96–106)
Creatinine, Ser: 1 mg/dL (ref 0.57–1.00)
Globulin, Total: 3.4 g/dL (ref 1.5–4.5)
Glucose: 88 mg/dL (ref 70–99)
Potassium: 5 mmol/L (ref 3.5–5.2)
Sodium: 138 mmol/L (ref 134–144)
Total Protein: 7.7 g/dL (ref 6.0–8.5)
eGFR: 67 mL/min/1.73 (ref >59)

## 2024-03-11 LAB — LIPASE: Lipase: 44 U/L (ref 14–72)

## 2024-03-11 LAB — MICROSCOPIC EXAMINATION
Bacteria, UA: NONE SEEN
Casts: NONE SEEN /LPF
RBC, Urine: NONE SEEN /HPF (ref 0–2)
WBC, UA: NONE SEEN /HPF (ref 0–5)

## 2024-03-11 LAB — AFP TUMOR MARKER: AFP, Serum, Tumor Marker: 3 ng/mL (ref 0.0–9.2)

## 2024-03-11 LAB — PROTIME-INR
INR: 0.9 (ref 0.9–1.2)
Prothrombin Time: 10.3 s (ref 9.1–12.0)

## 2024-03-16 ENCOUNTER — Ambulatory Visit: Payer: Self-pay | Admitting: Gastroenterology

## 2024-03-18 ENCOUNTER — Ambulatory Visit (HOSPITAL_COMMUNITY)

## 2024-03-23 DIAGNOSIS — J449 Chronic obstructive pulmonary disease, unspecified: Secondary | ICD-10-CM | POA: Diagnosis not present

## 2024-03-26 MED ORDER — LUBIPROSTONE 24 MCG PO CAPS: 24.0000 ug | ORAL_CAPSULE | Freq: Two times a day (BID) | ORAL | 3 refills | Status: AC

## 2024-03-26 NOTE — Telephone Encounter (Signed)
 Linzess  has caused bloating and she has given this a fair trial even with highest dose. I have sent in Amitiza 24 mcg to take BID with food.

## 2024-03-26 NOTE — Addendum Note (Signed)
 Addended by: SHIRLEAN THERISA ORN on: 03/26/2024 04:24 PM   Modules accepted: Orders

## 2024-03-29 ENCOUNTER — Ambulatory Visit (HOSPITAL_COMMUNITY)

## 2024-03-29 ENCOUNTER — Encounter (HOSPITAL_COMMUNITY): Payer: Self-pay

## 2024-03-29 NOTE — Telephone Encounter (Signed)
 Attempted to call patient and had to leave message.   I have changed her recently to Amitiza.  With constellation of symptoms, I am recommending a stat CT scan.   Please arrange stat CT abd/pelvis with contrast due to abdominal distension, abdominal pain, hx of liver disease, constipation.

## 2024-03-30 ENCOUNTER — Ambulatory Visit (HOSPITAL_COMMUNITY)
Admission: RE | Admit: 2024-03-30 | Discharge: 2024-03-30 | Disposition: A | Discharge status: Home / Self Care | Source: Ambulatory Visit | Attending: Gastroenterology | Admitting: Gastroenterology

## 2024-03-30 ENCOUNTER — Other Ambulatory Visit: Payer: Self-pay | Admitting: *Deleted

## 2024-03-30 DIAGNOSIS — K5900 Constipation, unspecified: Secondary | ICD-10-CM

## 2024-03-30 DIAGNOSIS — R1010 Upper abdominal pain, unspecified: Secondary | ICD-10-CM | POA: Insufficient documentation

## 2024-03-30 DIAGNOSIS — Z8719 Personal history of other diseases of the digestive system: Secondary | ICD-10-CM | POA: Insufficient documentation

## 2024-03-30 DIAGNOSIS — R140 Abdominal distension (gaseous): Secondary | ICD-10-CM

## 2024-03-30 MED ORDER — IOHEXOL 300 MG/ML  SOLN
100.0000 mL | Freq: Once | INTRAMUSCULAR | Status: AC | PRN
  Administered 2024-03-30: 100 mL via INTRAVENOUS

## 2024-03-30 NOTE — Telephone Encounter (Signed)
 Pt informed that CT is scheduled for 03/30/24, arrive at 2:45 pm to check in at [facility] radiology. Verbalized understanding.

## 2024-03-30 NOTE — Telephone Encounter (Signed)
 [employer] PA for CT: CPT Code 25822 Description: CT ABDOMEN & PELVIS W/ Case Number: [id number] Review Date: 03/30/2024 9:04:15 AM Expiration Date: N/A Status: This member's benefit plan did not require a prior authorization for this request.

## 2024-04-01 ENCOUNTER — Ambulatory Visit (HOSPITAL_COMMUNITY): Admission: RE | Admit: 2024-04-01 | Source: Ambulatory Visit

## 2024-04-01 ENCOUNTER — Ambulatory Visit: Payer: Self-pay | Admitting: Gastroenterology

## 2024-04-07 ENCOUNTER — Ambulatory Visit: Payer: Self-pay | Admitting: *Deleted

## 2024-04-07 NOTE — Telephone Encounter (Signed)
 FYI Only or Action Required?: FYI only for provider: no open appointment- UC advised .  Patient was last seen in primary care on 12/18/2023 by Tobie Suzzane POUR, MD.  Called Nurse Triage reporting Cough.  Symptoms began a week ago.  Interventions attempted: OTC medications:  cold/flu remedy.  Symptoms are: gradually worsening.  Triage Disposition: See Physician Within 24 Hours  Patient/caregiver understands and will follow disposition?: Yes  Copied from CRM [id number]. Topic: Clinical - Red Word Triage >> Apr 07, 2024  8:07 AM Willma SAUNDERS wrote: [facility] that prompted transfer to Nurse Triage: Patients she has been sick since last week, has chest pain, productive cough with greenish/brown mucous and a stuffy nose. Reason for Disposition  [1] Continuous (nonstop) coughing interferes with work or school AND [2] no improvement using cough treatment per Care Advice  Answer Assessment - Initial Assessment Questions 1. ONSET: When did the cough begin?      1 week ago 2. SEVERITY: How bad is the cough today?      Coughing all morning 3. SPUTUM: Describe the color of your sputum (e.g., none, dry cough; clear, white, yellow, green)     Green, brown 4. HEMOPTYSIS: Are you coughing up any blood? If Yes, ask: How much? (e.g., flecks, streaks, tablespoons, etc.)     no 5. DIFFICULTY BREATHING: Are you having difficulty breathing? If Yes, ask: How bad is it? (e.g., mild, moderate, severe)      no 6. FEVER: Do you have a fever? If Yes, ask: What is your temperature, how was it measured, and when did it start?     Unsure- not checked- cold/hot 7. CARDIAC HISTORY: Do you have any history of heart disease? (e.g., heart attack, congestive heart failure)      no 8. LUNG HISTORY: Do you have any history of lung disease?  (e.g., pulmonary embolus, asthma, emphysema)     COPD 9. PE RISK FACTORS: Do you have a history of blood clots? (or: recent major surgery, recent prolonged  travel, bedridden)     no 10. OTHER SYMPTOMS: Do you have any other symptoms? (e.g., runny nose, wheezing, chest pain)       Chest painful, burning in lungs  12. TRAVEL: Have you traveled out of the country in the last month? (e.g., travel history, exposures)       Grandchildren have been ill  Protocols used: Cough - Acute Productive-A-AH

## 2024-04-07 NOTE — Telephone Encounter (Signed)
 Noted

## 2024-04-08 MED ORDER — METOCLOPRAMIDE HCL 5 MG PO TABS: 5.0000 mg | ORAL_TABLET | Freq: Three times a day (TID) | ORAL | 1 refills | Status: DC

## 2024-04-12 ENCOUNTER — Ambulatory Visit

## 2024-04-21 ENCOUNTER — Other Ambulatory Visit: Payer: Self-pay | Admitting: Family Medicine

## 2024-04-21 ENCOUNTER — Ambulatory Visit: Admitting: Gastroenterology

## 2024-06-24 ENCOUNTER — Encounter: Payer: Self-pay | Admitting: Gastroenterology

## 2024-06-24 ENCOUNTER — Ambulatory Visit (INDEPENDENT_AMBULATORY_CARE_PROVIDER_SITE_OTHER): Admitting: Gastroenterology

## 2024-06-24 VITALS — BP 126/72 | HR 65 | Temp 98.7°F | Ht 65.0 in | Wt 187.0 lb

## 2024-06-24 DIAGNOSIS — K3184 Gastroparesis: Secondary | ICD-10-CM

## 2024-06-24 MED ORDER — METOCLOPRAMIDE HCL 5 MG PO TABS: 5.0000 mg | ORAL_TABLET | Freq: Three times a day (TID) | ORAL | 1 refills | Status: AC

## 2024-06-24 NOTE — Telephone Encounter (Signed)
 Jennifer Rollins: patient needs non-urgent follow-up with me. She had to reschedule today due to illness.   No charge for today's visit.

## 2024-06-24 NOTE — Progress Notes (Signed)
 Patient recently tested positive for Flu A a few days ago.  We have canceled this visit today. She will be rescheduled for when she recovers.

## 2024-06-28 ENCOUNTER — Telehealth: Payer: Self-pay

## 2024-06-28 ENCOUNTER — Telehealth: Payer: Self-pay | Admitting: Family Medicine

## 2024-06-28 ENCOUNTER — Encounter: Payer: Self-pay | Admitting: Family Medicine

## 2024-06-28 VITALS — BP 126/72 | Ht 65.0 in | Wt 187.0 lb

## 2024-06-28 DIAGNOSIS — E038 Other specified hypothyroidism: Secondary | ICD-10-CM

## 2024-06-28 NOTE — Progress Notes (Unsigned)
" ° °  Virtual Visit via Video Note  I connected with Jennifer Rollins on 07/02/24 at  3:40 PM EST by a video enabled telemedicine application and verified that I am speaking with the correct person using two identifiers.  Patient Location: Home Provider Location: Home Office  I discussed the limitations, risks, security, and privacy concerns of performing an evaluation and management service by video and the availability of in person appointments. I also discussed with the patient that there may be a patient responsible charge related to this service. The patient expressed understanding and agreed to proceed.  Subjective: PCP: Terry Wilhelmena Lloyd Hilario, FNP  Chief Complaint  Patient presents with   Follow-up    flu   HPI The patient presents today for management of chronic conditions and reports no complaints or concerns at this time. She reports adherence to her treatment regimen with no side effects or adverse effects from her medications   ROS: Per HPI Current Medications[1]  Observations/Objective: Today's Vitals   06/28/24 1427  BP: 126/72  Weight: 187 lb (84.8 kg)  Height: 5' 5 (1.651 m)   Physical Exam Patient is well-developed, well-nourished in no acute distress.  Resting comfortably at home.  Head is normocephalic, atraumatic.  No labored breathing.  Speech is clear and coherent with logical content.  Patient is alert and oriented at baseline.   Assessment and Plan: Other specified hypothyroidism Assessment & Plan: Stable The patient is encouraged to follow up if experiencing symptoms of hypothyroidism, despite compliance with the current treatment regimen, such as fatigue, weight gain, constipation, dry skin, cold intolerance, hair loss, or depression.     Encouraged the patient to continue her treatment regimen as prescribed. A heart-healthy diet was encouraged, along with increased physical activity as tolerated.  Follow Up Instructions: No follow-ups on  file.   I discussed the assessment and treatment plan with the patient. The patient was provided an opportunity to ask questions, and all were answered. The patient agreed with the plan and demonstrated an understanding of the instructions.   The patient was advised to call back or seek an in-person evaluation if the symptoms worsen or if the condition fails to improve as anticipated.  The above assessment and management plan was discussed with the patient. The patient verbalized understanding of and has agreed to the management plan.   Meade JENEANE Gerlach, FNP     [1]  Current Outpatient Medications:    DULoxetine  (CYMBALTA ) 30 MG capsule, Take 2 capsules by mouth once daily, Disp: 180 capsule, Rfl: 0   lubiprostone  (AMITIZA ) 24 MCG capsule, Take 1 capsule (24 mcg total) by mouth 2 (two) times daily with a meal., Disp: 60 capsule, Rfl: 3   metoCLOPramide  (REGLAN ) 5 MG tablet, Take 1 tablet (5 mg total) by mouth 3 (three) times daily before meals., Disp: 90 tablet, Rfl: 1   pantoprazole  (PROTONIX ) 40 MG tablet, Take 1 tablet (40 mg total) by mouth 2 (two) times daily., Disp: 180 tablet, Rfl: 3   rosuvastatin  (CRESTOR ) 5 MG tablet, Take 1 tablet by mouth once daily, Disp: 90 tablet, Rfl: 0   traZODone  (DESYREL ) 150 MG tablet, Take 1-2 tablets (150-300 mg total) by mouth at bedtime., Disp: 90 tablet, Rfl: 3   linaclotide  (LINZESS ) 290 MCG CAPS capsule, Take 1 capsule (290 mcg total) by mouth daily before breakfast. (Patient not taking: Reported on 06/24/2024), Disp: , Rfl:   "

## 2024-07-02 NOTE — Assessment & Plan Note (Addendum)
 Stable The patient is encouraged to follow up if experiencing symptoms of hypothyroidism, despite compliance with the current treatment regimen, such as fatigue, weight gain, constipation, dry skin, cold intolerance, hair loss, or depression.

## 2024-08-31 ENCOUNTER — Ambulatory Visit: Admitting: Gastroenterology

## 2024-12-20 ENCOUNTER — Ambulatory Visit
